# Patient Record
Sex: Female | Born: 1935 | Race: White | Hispanic: No | Marital: Married | Smoking: Never smoker
Health system: Southern US, Community
[De-identification: ages and names within clinical notes are randomized; demographics above are authoritative.]

## PROBLEM LIST (undated history)

## (undated) DIAGNOSIS — G459 Transient cerebral ischemic attack, unspecified: Secondary | ICD-10-CM

## (undated) DIAGNOSIS — E782 Mixed hyperlipidemia: Secondary | ICD-10-CM

## (undated) DIAGNOSIS — I5032 Chronic diastolic (congestive) heart failure: Secondary | ICD-10-CM

## (undated) DIAGNOSIS — I4891 Unspecified atrial fibrillation: Secondary | ICD-10-CM

## (undated) HISTORY — DX: Unspecified atrial fibrillation: I48.91

## (undated) HISTORY — DX: Mixed hyperlipidemia: E78.2

## (undated) HISTORY — DX: Transient cerebral ischemic attack, unspecified: G45.9

---

## 1997-03-01 HISTORY — PX: DILATION AND CURETTAGE OF UTERUS: SHX78

## 1998-10-25 ENCOUNTER — Ambulatory Visit (HOSPITAL_COMMUNITY): Admission: RE | Admit: 1998-10-25 | Discharge: 1998-10-25 | Payer: Self-pay | Admitting: Neurology

## 1998-10-25 ENCOUNTER — Encounter: Payer: Self-pay | Admitting: Neurology

## 2006-06-22 ENCOUNTER — Ambulatory Visit: Payer: Self-pay | Admitting: Cardiology

## 2006-07-27 ENCOUNTER — Ambulatory Visit: Payer: Self-pay | Admitting: Physician Assistant

## 2006-10-27 ENCOUNTER — Ambulatory Visit: Payer: Self-pay | Admitting: Cardiology

## 2010-07-14 NOTE — Assessment & Plan Note (Signed)
[facility]                          [facility]   LUZELENA, HEEG                            MRN:          [id number]  DATE:07/27/2006                            DOB:          1928/06/28    CARDIOLOGIST:  She is new to Dr. Andee Lineman.   PRIMARY CARE PHYSICIAN:  Dr. Sherril Croon in [place].   HISTORY OF PRESENT ILLNESS:  Ms. Guinther is a 77 year old female, a  retired Engineer, civil (consulting), who returns to the office today for post-hospitalization  followup.  She recently was noted to have paroxysmal atrial fibrillation  by CardioNet monitoring.  She also noted bilateral arm discomfort  associated with palpitations as well as shortness of breath.  She was  set up for an outpatient Myoview study after she ruled out for  myocardial infarction by enzymes; this was done Jul 01, 2006; it was an  adenosine study.  Her EF was 61% and her perfusion images were negative  for ischemia.  She returns today for followup.  She notes that she is  doing well.  She does note occasional palpitations, but they are much  better on the increased dose of Toprol.  She notes some chest pressure  with her palpitations that last about a minute.  She continues to note  the bilateral arm symptoms.  She also feels weak and tried with these.  She denies syncope or near-syncope.   CURRENT MEDICATIONS:  1. Toprol-XL 50 mg daily.  2. Coumadin as directed.   ALLERGIES:  CODEINE, SULFA and NOVOCAIN.   PHYSICAL EXAMINATION:  She is a well-developed, well-nourished female in  no acute distress.  Blood pressure is 139/83, pulse 72.  Weight 177.2 pounds.  HEENT:  Normal.  NECK:  Without JVD.  CARDIAC:  S1 and S2, regular rate and rhythm without murmurs.  LUNGS:  Clear to auscultation bilaterally with no significant rales.  ABDOMEN:  Soft and nontender with normoactive bowel sounds and no  organomegaly.  EXTREMITIES:  Without edema.  Calves are soft and nontender.  SKIN:  Warm and dry.  NEUROLOGIC:  She is alert and oriented x3.  Cranial nerves II-XII are  grossly intact.   IMPRESSION:  1. Paroxysmal atrial fibrillation.  2. Hypertension.  3. Nonischemic adenosine Myoview, Jul 01, 2006.  4. Good left ventricular function.  5. Aortic sclerosis by recent echocardiogram, mild.  6. Questionable history of transient ischemic attack.  7. Hypercholesterolemia, untreated.  8. Osteoporosis.  9. Osteoarthritis.  10.History of mild scoliosis.   PLAN:  The patient presents to the office today for a post-  hospitalization followup.  I reviewed her Cardiolite images with her.  She continues to be a little symptomatic with her paroxysmal atrial  fibrillation.  We checked an electrocardiogram in the office today.  Her  ventricular rate is 60.  She is still in sinus rhythm and there are no  ischemic changes.  Given that her heart rate is just 60, we will keep  her Toprol dose the same at this point in time.  She will continue to  follow up with Dr.  Vyas for her Coumadin and we will bring her back in 3  months for followup with Dr. Andee Lineman for further review.  She does still  have p.r.n. Lopressor to take should she have recurrent palpitations.  If her symptoms become bothersome for her, we will certainly see her  back sooner.  When she sees Dr. Andee Lineman back in followup, if she is  continuing to have symptoms and if they are quite bothersome for her, we  may want to consider repeating a monitor as well as considering  antiarrhythmic therapy.  Otherwise, if she remains fairly asymptomatic,  we will continue with Toprol and Coumadin therapy.  I will also discuss  with Dr. Andee Lineman before her next visit whether or not she could be a  candidate for the [employer]ocket Trial.  If he agrees, we will be in touch with  the patient over the telephone.      Tereso Newcomer, PA-C  Electronically Signed      Learta Codding, MD,FACC  Electronically Signed   SW/MedQ  DD: 07/27/2006  DT: 07/27/2006   Job #: [id number]   cc:   Doreen Beam

## 2010-07-14 NOTE — Assessment & Plan Note (Signed)
[facility]                          [facility]   Danielle Brooks, Danielle Brooks                            MRN:          [id number]  DATE:10/27/2006                            DOB:          August 04, 1928    REFERRING PHYSICIAN:  Dhruv Vyas   HISTORY OF PRESENT ILLNESS:  The patient is a 77 year old female,  retired Engineer, civil (consulting), with a history of paroxysmal atrial fibrillation  documented by CardioNet monitor.  The patient has been doing well.  She  has been asymptomatic.  Her [place] score is 2.  Although her blood  pressure is somewhat elevated here today, it has been running normal at  home.  She states on occasion she has skipped beat but this is rare.  She has otherwise no chest pain, shortness of breath, orthopnea, PND.  EKG in the office today shows a normal sinus rhythm with no acute  ischemic changes.   MEDICATIONS:  1. Toprol XL 50 mg p.o. daily.  2. Coumadin as directed.   PHYSICAL EXAMINATION:  VITAL SIGNS:  Blood pressure is 143/83, heart  rate is 65 beats per minute, weight is 182 pounds.  NECK:  Demonstrates normal carotid upstrokes.  No carotid bruits.  LUNGS:  Clear breath sounds bilaterally.  HEART:  Regular rate and rhythm.  Normal S1 S2.  No murmurs, rubs, or  gallops.  ABDOMEN:  Soft, nontender.  No rebound or guarding.  Good bowel sounds.  EXTREMITIES:  Reveals no cyanosis, clubbing, or edema.  NEUROLOGIC:  The patient is alert, oriented, and grossly nonfocal.   PROBLEM LIST:  1. Paroxysmal atrial fibrillation, now in normal sinus rhythm.  2. Hypertension.  3. Nonischemic adenosine Myoview on Jul 01, 2006.  4. Good left ventricular function.  5. Aortic sclerosis by recent echocardiogram.  6. Past history of transient ischemic attack.  7. Hypercholesterolemia.  8. Osteoporosis.   PLAN:  1. The patient's [place] score is 2, and therefore we will continue her      on Coumadin.  2. From a cardiovascular perspective, patient is  otherwise      asymptomatic.  I made no change      in the patient's medical regimen.  3. She can follow up with [place] in the next couple of months.     Danielle Codding, MD,FACC  Electronically Signed    GED/MedQ  DD: 10/28/2006  DT: 10/29/2006  Job #: [id number]   cc:   Doreen Beam

## 2011-06-21 ENCOUNTER — Encounter: Payer: Self-pay | Admitting: Internal Medicine

## 2011-07-28 ENCOUNTER — Encounter: Payer: Self-pay | Admitting: Cardiology

## 2011-07-28 ENCOUNTER — Encounter: Payer: Self-pay | Admitting: Internal Medicine

## 2011-07-29 ENCOUNTER — Ambulatory Visit (INDEPENDENT_AMBULATORY_CARE_PROVIDER_SITE_OTHER): Payer: Medicare Other | Admitting: Cardiology

## 2011-07-29 ENCOUNTER — Encounter: Payer: Self-pay | Admitting: Cardiology

## 2011-07-29 VITALS — BP 139/75 | HR 65 | Ht 62.0 in | Wt 172.0 lb

## 2011-07-29 DIAGNOSIS — R609 Edema, unspecified: Secondary | ICD-10-CM | POA: Insufficient documentation

## 2011-07-29 DIAGNOSIS — F410 Panic disorder [episodic paroxysmal anxiety] without agoraphobia: Secondary | ICD-10-CM

## 2011-07-29 DIAGNOSIS — M810 Age-related osteoporosis without current pathological fracture: Secondary | ICD-10-CM | POA: Insufficient documentation

## 2011-07-29 DIAGNOSIS — I4891 Unspecified atrial fibrillation: Secondary | ICD-10-CM

## 2011-07-29 DIAGNOSIS — I700 Atherosclerosis of aorta: Secondary | ICD-10-CM

## 2011-07-29 DIAGNOSIS — R002 Palpitations: Secondary | ICD-10-CM

## 2011-07-29 DIAGNOSIS — I480 Paroxysmal atrial fibrillation: Secondary | ICD-10-CM | POA: Insufficient documentation

## 2011-07-29 DIAGNOSIS — Z8673 Personal history of transient ischemic attack (TIA), and cerebral infarction without residual deficits: Secondary | ICD-10-CM

## 2011-07-29 DIAGNOSIS — R0602 Shortness of breath: Secondary | ICD-10-CM

## 2011-07-29 DIAGNOSIS — IMO0001 Reserved for inherently not codable concepts without codable children: Secondary | ICD-10-CM | POA: Insufficient documentation

## 2011-07-29 DIAGNOSIS — I10 Essential (primary) hypertension: Secondary | ICD-10-CM | POA: Insufficient documentation

## 2011-07-29 DIAGNOSIS — G4700 Insomnia, unspecified: Secondary | ICD-10-CM

## 2011-07-29 MED ORDER — PROPRANOLOL HCL ER 80 MG PO CP24: 80.0000 mg | ORAL_CAPSULE | Freq: Every day | ORAL | Status: DC

## 2011-07-29 MED ORDER — TRAZODONE HCL 100 MG PO TABS: 50.0000 mg | ORAL_TABLET | Freq: Every day | ORAL | Status: DC

## 2011-07-29 NOTE — Assessment & Plan Note (Signed)
This is dependent edema and I would not give the patient Lasix or a diuretic at this point in time.

## 2011-07-29 NOTE — Patient Instructions (Signed)
   Stop Metoprolol  Change to Propranolol LA 80mg  at bedtime  Trazodone 100mg  - may take 1/2 tab (50mg ) nightly for sleep, if needed may increase to 1 tab (100mg ) Follow up in  6-8 weeks

## 2011-07-29 NOTE — Assessment & Plan Note (Signed)
No recurrence patient on Coumadin anticoagulation. She will need to continue this.

## 2011-07-29 NOTE — Assessment & Plan Note (Signed)
The patient only sleeps 2-3 hours a night and this is significantly contributing to her daytime fatigue. I prescribed her trazodone 50 mg by mouth each bedtime which she can increase to 100 mg by mouth each bedtime needed

## 2011-07-29 NOTE — Progress Notes (Signed)
Danielle Bottoms, MD, [facility] Board Certified in Adult Cardiovascular Medicine,Internal Medicine and Critical Care Medicine    CC:  Evaluation of patient with palpitations and shortness of breath  HPI:  the patient is an 78 year old female , retired Engineer, civil (consulting) with a history of paroxysmal atrial fibrillation and prior TIAs. She is on chronic Coumadin therapy. She has been referred by primary care physician because of increased episodes of palpitations , shortness of breath on exertion often in the setting of anxiety and decreased exercise tolerance.  The patient is going through a stressful period in her life. Her son who has been cancer free for 12 years now has recurrence of non-Hodgkin's lymphoma and is not doing well. The patient feels very stressed about this and this has led to anxiety , possible depression as well as severe insomnia.  The patient only sleeps 2-3 hours a night. She also stopped drinking caffeine but now feels significantly fatigued during the daytime. However she has no other symptoms that point was possible obstructive sleep apnea.   the patient states that she has more frequent episodes of palpitations which are associated with shortness of breath. Typical episodes last 1 minute to several minutes. She feels anxious during this period of time and often takes Xanax which will make her shortness of breath better. The palpitations are never sustained. She does report significant decreased exercise tolerance with shortness of breath on exertion. An echocardiogram done in her primary care physician office however showed that she has normal LV function and no significant valvular lesions.   She denies any orthopnea PND presyncope or syncope.   PMH: reviewed and listed in Problem List in Electronic Records (and see below) Past Medical History  Diagnosis Date  . Osteoporosis   . Atrial fibrillation   . Vitamin d deficiency   . Chest pain, unspecified   . Atrial fibrillation   .  Other dyspnea and respiratory abnormality   . Palpitations   . Dizziness and giddiness   . Pure hypercholesterolemia   . Pain in limb   . Other malaise and fatigue   . Encounter for long-term (current) use of other medications    No past surgical history on file.  Allergies/SH/FHX : available in Electronic Records for review  Allergies  Allergen Reactions  . Bactrim (Sulfamethoxazole-Tmp Ds) Rash  . Codeine Itching  . Novocain (Procaine Hcl) Other (See Comments)    Sob and passed out years ago at dentist office   History   Social History  . Marital Status: Married    Spouse Name: JERRY    Number of Children: N/A  . Years of Education: N/A   Occupational History  . RETIRED     RN   Social History Main Topics  . Smoking status: Never Smoker   . Smokeless tobacco: Never Used  . Alcohol Use: No  . Drug Use: No  . Sexually Active: Not on file   Other Topics Concern  . Not on file   Social History Narrative  . No narrative on file   Family History  Problem Relation Age of Onset  . Coronary artery disease Father   . Alzheimer's disease Mother   . Hypertension Sister     Medications: Current Outpatient Prescriptions  Medication Sig Dispense Refill  . Vitamin D, Ergocalciferol, (DRISDOL) 50000 UNITS CAPS Take 50,000 Units by mouth every 7 (seven) days.      Marland Kitchen warfarin (COUMADIN) 5 MG tablet Take 5 mg by mouth as directed.  Managed by [facility] office      . propranolol ER (INDERAL LA) 80 MG 24 hr capsule Take 1 capsule (80 mg total) by mouth daily.  30 capsule  6  . traZODone (DESYREL) 100 MG tablet Take 0.5 tablets (50 mg total) by mouth at bedtime.  30 tablet  3    ROS: No nausea or vomiting. No fever or chills.No melena or hematochezia.No bleeding.No claudication  Physical Exam: BP 139/75  Pulse 65  Ht 5\' 2"  (1.575 m)  Wt 172 lb (78.019 kg)  BMI 31.46 kg/m2  SpO2 99% General: Well-nourished white female in no distress. Somewhat anxious appearing Neck: normal  carotid upstroke no carotid bruits. No thyromegaly nonnodular thyroid. JVP is 6-7 cm Lungs: clear breath sounds bilaterally without any wheezing or crackles Cardiac: regular rate and rhythm with normal S1-S2. No murmur rubs or gallops Vascular:  1+ peripheral pitting edema , normal distal pulses bilaterally Skin: warm and dry Physcologic: depressed affect  12lead ECG: normal sinus rhythm otherwise normal tracing Limited bedside ECHO:N/A No images are attached to the encounter.   Assessment and Plan  Shortness of breath  Patient reports shortness of breath on exertion. She had a negative ischemia workup in the past. She reports that her shortness of breath gets better when she takes Xanax. Is very fatigued  and has decreased stamina. She has some lower  extremity edema but this appears to be dependent edema. She had an echocardiogram done recently by her primary care physician which showed a normal ejection fraction and no significant valvular lesions. At this point I do not think the patient needs Lasix. I also do not think she needs an ischemia workup and I think her shortness of breath is related to do deconditioning and also in the setting of her anxiety and depression. Palpitations/proximal atrial fibrillation may be contributing  We may need to do a CardioNet monitor if her symptoms worsen  Hx-TIA (transient ischemic attack)  No recurrence patient on Coumadin anticoagulation. She will need to continue this.  Insomnia  The patient only sleeps 2-3 hours a night and this is significantly contributing to her daytime fatigue. I prescribed her trazodone 50 mg by mouth each bedtime which she can increase to 100 mg by mouth each bedtime needed  Palpitations  Patient appears to have transient episodes of atrial fibrillation typically lasting from 1 minute to several minutes. There is associated shortness of breath but also increased anxiety. I'm not sure if she actually has atrial fibrillation or  sinus tachycardia in the setting of anxiety/panic. I will switch the patient from metoprolol to propranolol LA 80 minutes by mouth each bedtime and if no improvement in her symptoms we will apply CardioNet monitor.  Edema  This is dependent edema and I would not give the patient Lasix or a diuretic at this point in time.    Patient Active Problem List  Diagnoses  . Edema  . Panic attacks  . Insomnia  . Paroxysmal atrial fibrillation  . Palpitations  . Aortic sclerosis  . Hypertension  . Osteoporosis  . Shortness of breath  . Hx-TIA (transient ischemic attack)

## 2011-07-29 NOTE — Assessment & Plan Note (Signed)
Patient reports shortness of breath on exertion. She had a negative ischemia workup in the past. She reports that her shortness of breath gets better when she takes Xanax. Is very fatigued  and has decreased stamina. She has some lower  extremity edema but this appears to be dependent edema. She had an echocardiogram done recently by her primary care physician which showed a normal ejection fraction and no significant valvular lesions. At this point I do not think the patient needs Lasix. I also do not think she needs an ischemia workup and I think her shortness of breath is related to do deconditioning and also in the setting of her anxiety and depression. Palpitations/proximal atrial fibrillation may be contributing  We may need to do a CardioNet monitor if her symptoms worsen

## 2011-07-29 NOTE — Assessment & Plan Note (Signed)
Patient appears to have transient episodes of atrial fibrillation typically lasting from 1 minute to several minutes. There is associated shortness of breath but also increased anxiety. I'm not sure if she actually has atrial fibrillation or sinus tachycardia in the setting of anxiety/panic. I will switch the patient from metoprolol to propranolol LA 80 minutes by mouth each bedtime and if no improvement in her symptoms we will apply CardioNet monitor.

## 2011-09-28 ENCOUNTER — Ambulatory Visit: Payer: Medicare Other | Admitting: Cardiology

## 2011-12-01 ENCOUNTER — Other Ambulatory Visit: Payer: Self-pay | Admitting: Cardiology

## 2011-12-23 ENCOUNTER — Ambulatory Visit: Payer: Medicare Other | Admitting: Cardiology

## 2011-12-30 ENCOUNTER — Other Ambulatory Visit: Payer: Self-pay | Admitting: Cardiology

## 2012-01-07 ENCOUNTER — Encounter: Payer: Self-pay | Admitting: Cardiology

## 2012-01-07 ENCOUNTER — Ambulatory Visit: Payer: Medicare Other | Admitting: Cardiology

## 2012-01-07 ENCOUNTER — Ambulatory Visit (INDEPENDENT_AMBULATORY_CARE_PROVIDER_SITE_OTHER): Payer: Medicare Other | Admitting: Cardiology

## 2012-01-07 VITALS — BP 158/82 | HR 58 | Ht 62.0 in | Wt 176.0 lb

## 2012-01-07 DIAGNOSIS — R002 Palpitations: Secondary | ICD-10-CM

## 2012-01-07 DIAGNOSIS — I480 Paroxysmal atrial fibrillation: Secondary | ICD-10-CM

## 2012-01-07 DIAGNOSIS — I10 Essential (primary) hypertension: Secondary | ICD-10-CM

## 2012-01-07 DIAGNOSIS — Z8673 Personal history of transient ischemic attack (TIA), and cerebral infarction without residual deficits: Secondary | ICD-10-CM

## 2012-01-07 DIAGNOSIS — I4891 Unspecified atrial fibrillation: Secondary | ICD-10-CM

## 2012-01-07 NOTE — Progress Notes (Signed)
   Clinical Summary Danielle Brooks is a 78 y.o.female presenting for office followup. She is a former patient of Dr. Andee Lineman, most recently seen in May of this year. I reviewed her records. She states that she has been doing better since the last visit, sleeping well. She reports only very brief palpitations, nothing prolonged, no hospitalizations.  ECG today shows sinus bradycardia with increased voltage.  She continues on Coumadin under the direction of Dr. Sherril Croon. Denies any significant bleeding problems.  She states that she has gained weight. We talked about regular walking regimen, also her diet.   Allergies  Allergen Reactions  . Bactrim (Sulfamethoxazole-Tmp Ds) Rash  . Codeine Itching  . Novocain (Procaine Hcl) Other (See Comments)    Sob and passed out years ago at dentist office    Current Outpatient Prescriptions  Medication Sig Dispense Refill  . propranolol ER (INDERAL LA) 80 MG 24 hr capsule Take 1 capsule (80 mg total) by mouth daily.  30 capsule  6  . traZODone (DESYREL) 100 MG tablet take 1/2 tablet by mouth at bedtime **MAY INCREASE TO 1 TABLET AT BEDTIME IF NEEDED**  15 tablet  0  . Vitamin D, Ergocalciferol, (DRISDOL) 50000 UNITS CAPS Take 50,000 Units by mouth every 7 (seven) days.      Marland Kitchen warfarin (COUMADIN) 5 MG tablet Take 5 mg by mouth as directed. Managed by [facility] office        Past Medical History  Diagnosis Date  . Osteoporosis   . Atrial fibrillation     Paroxysmal, history of normal LV and negative ischemic workup  . Vitamin D deficiency   . Mixed hyperlipidemia   . TIA (transient ischemic attack)     Social History Danielle Brooks reports that she has never smoked. She has never used smokeless tobacco. Danielle Brooks reports that she does not drink alcohol.  Review of Systems Outlined above, otherwise negative.  Physical Examination Filed Vitals:   01/07/12 1526  BP: 158/82  Pulse: 58   Filed Weights   01/07/12 1526  Weight: 176 lb 0.6 oz (79.851 kg)    No acute distress. HEENT: Conjunctiva and lids normal, oropharynx clear. Neck: Supple, no elevated JVP or carotid bruits, no thyromegaly. Lungs: Clear to auscultation, nonlabored breathing at rest. Cardiac: Regular rate and rhythm, no S3 or significant systolic murmur, no pericardial rub. Abdomen: Soft, nontender, bowel sounds present. Extremities: No pitting edema, distal pulses 2+.   Problem List and Plan   Paroxysmal atrial fibrillation Continue current strategy of medical therapy and observation. She continues on Coumadin per Dr. Sherril Croon. She has only occasional brief palpitations. No clear indication for modifying medical therapy or consider antiarrhythmics at this time.  Hypertension Blood pressure elevated today, typically not the case per her discussion. I asked her to check occasionally at home.  Hx-TIA (transient ischemic attack) Reports no obvious neurological symptoms since being on Coumadin.    Jonelle Sidle, M.D., F.A.C.C.

## 2012-01-07 NOTE — Assessment & Plan Note (Signed)
Reports no obvious neurological symptoms since being on Coumadin.

## 2012-01-07 NOTE — Assessment & Plan Note (Signed)
Continue current strategy of medical therapy and observation. She continues on Coumadin per Dr. Sherril Croon. She has only occasional brief palpitations. No clear indication for modifying medical therapy or consider antiarrhythmics at this time.

## 2012-01-07 NOTE — Patient Instructions (Signed)
Your physician recommends that you schedule a follow-up appointment in: 6 MONTHS IN THE [facility]ICE  Your physician recommends that you continue on your current medications as directed. Please refer to the Current Medication list given to you today.

## 2012-01-07 NOTE — Assessment & Plan Note (Signed)
Blood pressure elevated today, typically not the case per her discussion. I asked her to check occasionally at home.

## 2012-01-18 ENCOUNTER — Other Ambulatory Visit: Payer: Self-pay | Admitting: *Deleted

## 2012-01-18 MED ORDER — TRAZODONE HCL 100 MG PO TABS: ORAL_TABLET | ORAL | Status: DC

## 2012-02-28 ENCOUNTER — Other Ambulatory Visit: Payer: Self-pay | Admitting: Cardiology

## 2012-02-28 MED ORDER — PROPRANOLOL HCL ER 80 MG PO CP24: 80.0000 mg | ORAL_CAPSULE | Freq: Every day | ORAL | Status: DC

## 2012-08-30 ENCOUNTER — Other Ambulatory Visit: Payer: Self-pay | Admitting: Cardiology

## 2012-08-30 MED ORDER — PROPRANOLOL HCL ER 80 MG PO CP24: 80.0000 mg | ORAL_CAPSULE | Freq: Every day | ORAL | Status: DC

## 2012-10-19 ENCOUNTER — Ambulatory Visit (INDEPENDENT_AMBULATORY_CARE_PROVIDER_SITE_OTHER): Payer: Medicare Other | Admitting: Cardiology

## 2012-10-19 ENCOUNTER — Encounter: Payer: Self-pay | Admitting: Cardiology

## 2012-10-19 VITALS — BP 144/84 | HR 60 | Ht 62.0 in | Wt 190.0 lb

## 2012-10-19 DIAGNOSIS — I480 Paroxysmal atrial fibrillation: Secondary | ICD-10-CM

## 2012-10-19 DIAGNOSIS — I4891 Unspecified atrial fibrillation: Secondary | ICD-10-CM

## 2012-10-19 DIAGNOSIS — I10 Essential (primary) hypertension: Secondary | ICD-10-CM

## 2012-10-19 DIAGNOSIS — R0602 Shortness of breath: Secondary | ICD-10-CM

## 2012-10-19 NOTE — Progress Notes (Signed)
   Clinical Summary Danielle Brooks is an 79 y.o.female last seen in November 2013. She reports continued problems with anxiety, feels overwhelmed at times. Has not been exercising. Stays active with her church however and other community activities as best she can.  She does report brief episodes of palpitations, does not seem to be any progression in either frequency or duration.  She continues on Coumadin under the direction of Dr. Sherril Croon. Denies any significant bleeding problems.  Also feels short of breath at times. Echocardiogram from last April done at [facility] reported normal LVEF with mild mitral, aortic, and tricuspid regurgitation.  ECG today shows normal sinus rhythm.   Allergies  Allergen Reactions  . Bactrim [Sulfamethoxazole-Tmp Ds] Rash  . Codeine Itching  . Novocain [Procaine Hcl] Other (See Comments)    Sob and passed out years ago at dentist office    Current Outpatient Prescriptions  Medication Sig Dispense Refill  . propranolol ER (INDERAL LA) 80 MG 24 hr capsule Take 1 capsule (80 mg total) by mouth daily.  30 capsule  2  . traZODone (DESYREL) 100 MG tablet take 1/2 tablet by mouth at bedtime **MAY INCREASE TO 1 TABLET AT BEDTIME IF NEEDED**  30 tablet  2  . Vitamin D, Ergocalciferol, (DRISDOL) 50000 UNITS CAPS Take 50,000 Units by mouth every 7 (seven) days.      Marland Kitchen warfarin (COUMADIN) 5 MG tablet Take 5 mg by mouth as directed. Managed by [facility] office       No current facility-administered medications for this visit.    Past Medical History  Diagnosis Date  . Osteoporosis   . Atrial fibrillation     Paroxysmal, history of normal LV and negative ischemic workup  . Vitamin D deficiency   . Mixed hyperlipidemia   . TIA (transient ischemic attack)     Social History Danielle Brooks reports that she has never smoked. She has never used smokeless tobacco. Danielle Brooks reports that she does not drink alcohol.  Review of Systems No cough, fevers or chills. States that she is  eating too much due to anxiety. Sleeps well. Otherwise negative.  Physical Examination Filed Vitals:   10/19/12 1118  BP: 144/84  Pulse: 60   Filed Weights   10/19/12 1118  Weight: 190 lb (86.183 kg)     No acute distress.  HEENT: Conjunctiva and lids normal, oropharynx clear.  Neck: Supple, no elevated JVP or carotid bruits, no thyromegaly.  Lungs: Clear to auscultation, nonlabored breathing at rest.  Cardiac: Regular rate and rhythm, no S3 or significant systolic murmur, no pericardial rub.  Abdomen: Soft, nontender, bowel sounds present.  Extremities: No pitting edema, distal pulses 2+.   Problem List and Plan   Paroxysmal atrial fibrillation Continue current medical regimen. She is describing shortness of breath, and will follow up with an echocardiogram to ensure no progression of valvular disease. Exam is stable. Followup arranged.  Hypertension Keep follow up with Dr. Sherril Croon.    Jonelle Sidle, M.D., F.A.C.C.

## 2012-10-19 NOTE — Patient Instructions (Addendum)

## 2012-10-19 NOTE — Assessment & Plan Note (Signed)
Continue current medical regimen. She is describing shortness of breath, and will follow up with an echocardiogram to ensure no progression of valvular disease. Exam is stable. Followup arranged.

## 2012-10-19 NOTE — Assessment & Plan Note (Signed)
Keep followup with Dr. Vyas. 

## 2012-10-26 ENCOUNTER — Other Ambulatory Visit: Payer: Self-pay

## 2012-10-26 ENCOUNTER — Other Ambulatory Visit (INDEPENDENT_AMBULATORY_CARE_PROVIDER_SITE_OTHER): Payer: Medicare Other

## 2012-10-26 ENCOUNTER — Other Ambulatory Visit: Payer: Medicare Other

## 2012-10-26 DIAGNOSIS — I480 Paroxysmal atrial fibrillation: Secondary | ICD-10-CM

## 2012-10-26 DIAGNOSIS — I4891 Unspecified atrial fibrillation: Secondary | ICD-10-CM

## 2012-10-26 DIAGNOSIS — R0602 Shortness of breath: Secondary | ICD-10-CM

## 2012-10-31 ENCOUNTER — Telehealth: Payer: Self-pay | Admitting: *Deleted

## 2012-10-31 NOTE — Telephone Encounter (Signed)
Message copied by Eustace Moore on Tue Oct 31, 2012  3:34 PM ------      Message from: MCDOWELL, Illene Bolus      Created: Fri Oct 27, 2012  2:01 PM       LVEF remains normal at 55-60%. Does have diastolic dysfunction with increased filling pressures - she might consider the addition of a low-dose diuretic for treatment of hypertension, may help her shortness of breath as well. No major progression of valvular disease, mild aortic and mitral regurgitation as before. ------

## 2012-10-31 NOTE — Telephone Encounter (Signed)
Patient informed. Patient states she does have furosemide 40 mg and K+ 20 meq that was given to her by Dr. Sherril Croon to use as needed only.

## 2012-12-22 ENCOUNTER — Other Ambulatory Visit: Payer: Self-pay | Admitting: Cardiology

## 2012-12-22 MED ORDER — PROPRANOLOL HCL ER 80 MG PO CP24: 80.0000 mg | ORAL_CAPSULE | Freq: Every day | ORAL | Status: DC

## 2013-01-01 ENCOUNTER — Ambulatory Visit: Payer: Medicare Other | Admitting: Cardiology

## 2013-01-04 ENCOUNTER — Other Ambulatory Visit: Payer: Self-pay

## 2013-06-06 ENCOUNTER — Ambulatory Visit: Payer: Medicare Other | Admitting: Cardiology

## 2013-06-28 ENCOUNTER — Ambulatory Visit (INDEPENDENT_AMBULATORY_CARE_PROVIDER_SITE_OTHER): Payer: Medicare Other | Admitting: Cardiology

## 2013-06-28 ENCOUNTER — Encounter: Payer: Self-pay | Admitting: Cardiology

## 2013-06-28 VITALS — BP 168/77 | HR 56 | Ht 62.0 in | Wt 190.0 lb

## 2013-06-28 DIAGNOSIS — I4891 Unspecified atrial fibrillation: Secondary | ICD-10-CM

## 2013-06-28 DIAGNOSIS — I480 Paroxysmal atrial fibrillation: Secondary | ICD-10-CM

## 2013-06-28 DIAGNOSIS — I10 Essential (primary) hypertension: Secondary | ICD-10-CM

## 2013-06-28 NOTE — Assessment & Plan Note (Signed)
Blood pressure is up today, much more than typical per discussion with her. 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 We discussed weight loss, increasing her activity. She should record blood pressure and follow up with Dr. Sherril CroonVyas. If measurements remain elevated, might consider using HCTZ in light of concurrent diastolic dysfunction.

## 2013-06-28 NOTE — Assessment & Plan Note (Signed)
She is in sinus rhythm on examination today. Continue current medical regimen including Inderal and Coumadin. Followup arranged in 6 months.

## 2013-06-28 NOTE — Patient Instructions (Signed)
Continue all current medications. Your physician wants you to follow up in: 6 months.  You will receive a reminder letter in the mail one-two months in advance.  If you don't receive a letter, please call our office to schedule the follow up appointment   

## 2013-06-28 NOTE — Progress Notes (Signed)
Clinical Summary [name] is an 80 y.o.female last seen in August 2014. Overall she has been doing reasonably well from a cardiac perspective. 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 Sounds like she may have slipped into some depression at that time, ate lots of sweets, and gained about 25 pounds. Blood pressure is elevated today. We talked about her lifestyle, she states that she is feeling better and "knows how to lose the weight." She admits that she has not been as active.  Echocardiogram from August 2014 showed LVEF 55-60% with diastolic dysfunction associated with increased filling pressures, mild aortic regurgitation, mild mitral regurgitation, mild left atrial enlargement. She has Lasix which she rarely uses at all. Does have some leg edema, also knee arthritis.  She continues on Coumadin under the direction of Dr. Sherril CroonVyas. Denies any significant bleeding problems.   Allergies  Allergen Reactions  . Bactrim [Sulfamethoxazole-Tmp Ds] Rash  . Codeine Itching  . Novocain [Procaine Hcl] Other (See Comments)    Sob and passed out years ago at dentist office    Current Outpatient Prescriptions  Medication Sig Dispense Refill  . furosemide (LASIX) 40 MG tablet Take 40 mg by mouth daily as needed.      . potassium chloride SA (K-DUR,KLOR-CON) 20 MEQ tablet Take 20 mEq by mouth daily as needed.      . propranolol ER (INDERAL LA) 80 MG 24 hr capsule Take 1 capsule (80 mg total) by mouth daily.  30 capsule  6  . traZODone (DESYREL) 100 MG tablet take 1/2 tablet by mouth at bedtime **MAY INCREASE TO 1 TABLET AT BEDTIME IF NEEDED**  30 tablet  2  . Vitamin D, Ergocalciferol, (DRISDOL) 50000 UNITS CAPS Take 50,000 Units by mouth every 7 (seven) days.      Marland Kitchen. warfarin (COUMADIN) 5 MG tablet Take 5 mg by mouth as directed.  Managed by [facility] office       No current facility-administered medications for this visit.    Past Medical History  Diagnosis Date  . Osteoporosis   . Atrial fibrillation     Paroxysmal, history of normal LV and negative ischemic workup  . Vitamin D deficiency   . Mixed hyperlipidemia   . TIA (transient ischemic attack)     Social History [name] reports that she has never smoked. She has never used smokeless tobacco. [name] reports that she does not drink alcohol.  Review of Systems Negative except as outlined.  Physical Examination Filed Vitals:   06/28/13 1356  BP: 168/77  Pulse: 56   Filed Weights   06/28/13 1356  Weight: 190 lb (86.183 kg)    No acute distress.  HEENT: Conjunctiva and lids normal, oropharynx clear.  Neck: Supple, no elevated JVP or carotid bruits, no thyromegaly.  Lungs: Clear to auscultation, nonlabored breathing at rest.  Cardiac: Regular rate and rhythm, no S3 or significant systolic murmur, no pericardial rub.  Abdomen: Soft, nontender, bowel sounds present.  Extremities: No pitting edema, distal pulses 2+.   Problem List and Plan   Paroxysmal atrial fibrillation She is in sinus rhythm on examination today. Continue current medical regimen including Inderal and Coumadin. Followup arranged in 6 months.  Hypertension Blood pressure is up today, much more than typical per discussion with her. 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 We discussed weight loss, increasing her activity. She should record blood pressure and follow up with Dr. Sherril CroonVyas. If measurements remain elevated, might consider using HCTZ in light of concurrent diastolic dysfunction.    Jonelle SidleSamuel G. Consuello Lassalle, M.D., F.A.C.C.

## 2013-07-10 ENCOUNTER — Other Ambulatory Visit: Payer: Self-pay | Admitting: *Deleted

## 2013-07-10 MED ORDER — PROPRANOLOL HCL ER 80 MG PO CP24: 80.0000 mg | ORAL_CAPSULE | Freq: Every day | ORAL | Status: DC

## 2013-07-19 ENCOUNTER — Encounter: Payer: Self-pay | Admitting: Cardiology

## 2014-01-28 ENCOUNTER — Other Ambulatory Visit: Payer: Self-pay | Admitting: *Deleted

## 2014-01-28 MED ORDER — PROPRANOLOL HCL ER 80 MG PO CP24: 80.0000 mg | ORAL_CAPSULE | Freq: Every day | ORAL | Status: DC

## 2014-02-07 ENCOUNTER — Encounter: Payer: Self-pay | Admitting: Cardiology

## 2014-02-07 ENCOUNTER — Ambulatory Visit (INDEPENDENT_AMBULATORY_CARE_PROVIDER_SITE_OTHER): Payer: Medicare Other | Admitting: Cardiology

## 2014-02-07 VITALS — BP 157/74 | HR 59 | Ht 62.0 in | Wt 191.0 lb

## 2014-02-07 DIAGNOSIS — I10 Essential (primary) hypertension: Secondary | ICD-10-CM

## 2014-02-07 DIAGNOSIS — I480 Paroxysmal atrial fibrillation: Secondary | ICD-10-CM

## 2014-02-07 NOTE — Progress Notes (Signed)
   Reason for visit: Atrial fibrillation  Clinical Summary Danielle Brooks is an 80 y.o.female last seen in April. She presents for a routine visit. Reports occasional palpitations and spells which she attributes to anxiety mostly. No exertional chest pain or shortness of breath. She has been focused on her husband's health in the last few months.  Heart rate is regular today. ECG from May showed sinus rhythm with PAC. She continues on Coumadin for stroke prophylaxis with PAF, also Inderal LA with no changes.  Echocardiogram from August 2014 showed LVEF 55-60% with diastolic dysfunction associated with increased filling pressures, mild aortic regurgitation, mild mitral regurgitation, mild left atrial enlargement.  Allergies  Allergen Reactions  . Bactrim [Sulfamethoxazole-Trimethoprim] Rash  . Codeine Itching  . Novocain [Procaine Hcl] Other (See Comments)    Sob and passed out years ago at dentist office    Current Outpatient Prescriptions  Medication Sig Dispense Refill  . furosemide (LASIX) 40 MG tablet Take 40 mg by mouth daily as needed.    . potassium chloride SA (K-DUR,KLOR-CON) 20 MEQ tablet Take 20 mEq by mouth daily as needed.    . propranolol ER (INDERAL LA) 80 MG 24 hr capsule Take 1 capsule (80 mg total) by mouth daily. 30 capsule 6  . traZODone (DESYREL) 100 MG tablet take 1/2 tablet by mouth at bedtime **MAY INCREASE TO 1 TABLET AT BEDTIME IF NEEDED** 30 tablet 2  . Vitamin D, Ergocalciferol, (DRISDOL) 50000 UNITS CAPS Take 50,000 Units by mouth every 7 (seven) days.    Marland Kitchen. warfarin (COUMADIN) 5 MG tablet Take 5 mg by mouth as directed. Managed by [facility] office     No current facility-administered medications for this visit.    Past Medical History  Diagnosis Date  . Osteoporosis   . Atrial fibrillation     Paroxysmal, history of normal LV and negative ischemic workup  . Vitamin D deficiency   . Mixed hyperlipidemia   . TIA (transient ischemic attack)     Social  History Ms. Rufo reports that she has never smoked. She has never used smokeless tobacco. Danielle Brooks reports that she does not drink alcohol.  Review of Systems Complete review of systems negative except as otherwise outlined in the clinical summary.  Physical Examination Filed Vitals:   02/07/14 0915  BP: 157/74  Pulse: 59   Filed Weights   02/07/14 0915  Weight: 191 lb (86.637 kg)    No acute distress.  HEENT: Conjunctiva and lids normal, oropharynx clear.  Neck: Supple, no elevated JVP or carotid bruits, no thyromegaly.  Lungs: Clear to auscultation, nonlabored breathing at rest.  Cardiac: Regular rate and rhythm, no S3 or significant systolic murmur, no pericardial rub.  Abdomen: Soft, nontender, bowel sounds present.  Extremities: No pitting edema, distal pulses 2+.   Problem List and Plan   Paroxysmal atrial fibrillation Continue Coumadin and Inderal LA. Anticoagulation is followed by Dr. Sherril CroonVyas. Annual follow-up.  Essential hypertension Blood pressure is elevated today. Patient states that stress contributes to this. I have asked her to keep follow-up with Dr. Sherril CroonVyas in case additional medical therapy is needed. She is already limiting sodium in her diet.    Jonelle SidleSamuel G. Marvel Sapp, M.D., F.A.C.C.

## 2014-02-07 NOTE — Patient Instructions (Signed)

## 2014-02-07 NOTE — Assessment & Plan Note (Signed)
Continue Coumadin and Inderal LA. Anticoagulation is followed by Dr. Sherril CroonVyas. Annual follow-up.

## 2014-02-07 NOTE — Assessment & Plan Note (Signed)
Blood pressure is elevated today. Patient states that stress contributes to this. I have asked her to keep follow-up with Dr. Sherril CroonVyas in case additional medical therapy is needed. She is already limiting sodium in her diet.

## 2014-10-15 ENCOUNTER — Ambulatory Visit (INDEPENDENT_AMBULATORY_CARE_PROVIDER_SITE_OTHER): Payer: Medicare Other | Admitting: Cardiology

## 2014-10-15 ENCOUNTER — Encounter: Payer: Self-pay | Admitting: Cardiology

## 2014-10-15 ENCOUNTER — Encounter: Payer: Self-pay | Admitting: *Deleted

## 2014-10-15 VITALS — BP 100/82 | HR 102 | Ht 65.0 in | Wt 188.0 lb

## 2014-10-15 DIAGNOSIS — I5032 Chronic diastolic (congestive) heart failure: Secondary | ICD-10-CM

## 2014-10-15 DIAGNOSIS — Z5181 Encounter for therapeutic drug level monitoring: Secondary | ICD-10-CM | POA: Diagnosis not present

## 2014-10-15 DIAGNOSIS — I480 Paroxysmal atrial fibrillation: Secondary | ICD-10-CM | POA: Diagnosis not present

## 2014-10-15 DIAGNOSIS — I10 Essential (primary) hypertension: Secondary | ICD-10-CM | POA: Diagnosis not present

## 2014-10-15 DIAGNOSIS — R0602 Shortness of breath: Secondary | ICD-10-CM | POA: Diagnosis not present

## 2014-10-15 NOTE — Patient Instructions (Addendum)
Your physician recommends that you continue on your current medications as directed. Please refer to the Current Medication list given to you today. Your physician recommends that you return for lab work to check your liver and thyroid funcitoning. Your physician has requested that you have a lexiscan myoview. For further information please visit [email or web address] Please follow instruction sheet, as given. Your physician recommends that you schedule a follow-up appointment in: 2 weeks.

## 2014-10-15 NOTE — Progress Notes (Signed)
Cardiology Office Note  Date: 10/15/2014   ID: Danielle Brooks, DOB 1928/03/10, MRN [id number]  PCP: Ignatius Specking., MD  Primary Cardiologist: Nona Dell, MD   Chief Complaint  Patient presents with  . PAF  . Shortness of Breath    History of Present Illness: Danielle Brooks is an 81 y.o. female last seen in December 2015. Limited records indicate hospitalization at [facility] in July with reported heart failure symptoms and asthmatic bronchitis. She is here today with her husband for a follow-up visit. She tells me that she has been experiencing more atrial fibrillation, also increasing dyspnea on exertion and mild precordial discomfort. She states that she felt as if she was in atrial fibrillation today, and this is confirmed by ECG. Otherwise medications have not changed from a cardiac perspective.  She had a recent echocardiogram done through [facility] in late July reporting LVEF 50-55%, mild mitral regurgitation, mild aortic regurgitation, mild tricuspid regurgitation, and no pericardial effusion.  She continues on Coumadin, followed by Dr. Sherril Croon. No reported bleeding problems.  She has not been on antiarrhythmic therapy before. We did discussed this today, possibilities of flecainide or amiodarone. She has not had an ischemic workup recently.   Past Medical History  Diagnosis Date  . Osteoporosis   . Atrial fibrillation     Paroxysmal, history of normal LV and negative ischemic workup  . Vitamin D deficiency   . Mixed hyperlipidemia   . TIA (transient ischemic attack)     History reviewed. No pertinent past surgical history.  Current Outpatient Prescriptions  Medication Sig Dispense Refill  . furosemide (LASIX) 40 MG tablet Take 20 mg by mouth every other day.     . metoprolol (LOPRESSOR) 50 MG tablet Take 50 mg by mouth 2 (two) times daily.    . potassium chloride SA (K-DUR,KLOR-CON) 20 MEQ tablet Take 20 mEq by mouth every other day.     . traZODone  (DESYREL) 100 MG tablet take 1/2 tablet by mouth at bedtime **MAY INCREASE TO 1 TABLET AT BEDTIME IF NEEDED** 30 tablet 2  . Vitamin D, Ergocalciferol, (DRISDOL) 50000 UNITS CAPS Take 50,000 Units by mouth every 7 (seven) days.    Marland Kitchen warfarin (COUMADIN) 5 MG tablet Take 5 mg by mouth as directed. Managed by [facility] office     No current facility-administered medications for this visit.    Allergies:  Bactrim; Codeine; and Novocain   Social History: The patient  reports that she has never smoked. She has never used smokeless tobacco. She reports that she does not drink alcohol or use illicit drugs.   ROS:  Please see the history of present illness. Otherwise, complete review of systems is positive for none.  All other systems are reviewed and negative.   Physical Exam: VS:  BP 100/82 mmHg  Pulse 102  Ht  (1.651 m)  Wt 188 lb (85.276 kg)  BMI 31.28 kg/m2  SpO2 97%, BMI Body mass index is 31.28 kg/(m^2).  Wt Readings from Last 3 Encounters:  10/15/14 188 lb (85.276 kg)  02/07/14 191 lb (86.637 kg)  06/28/13 190 lb (86.183 kg)    No acute distress.  HEENT: Conjunctiva and lids normal, oropharynx clear.  Neck: Supple, no elevated JVP or carotid bruits, no thyromegaly.  Lungs: Clear to auscultation, nonlabored breathing at rest.  Cardiac: Irregularly irregular, no S3 or significant systolic murmur, no pericardial rub.  Abdomen: Soft, nontender, bowel sounds present.  Extremities: No pitting edema, distal pulses  2+. Skin: Warm and dry. Musculoskeletal: No kyphosis. Neuropsychiatric: Alert and oriented 3, affect appropriate.   ECG: ECG is ordered today and shows atrial fibrillation at 104 bpm with nonspecific T-wave changes and low voltage.   Recent Labwork:  July 2016: BUN 23, creatinine 0.8, potassium 4.0, INR 2.2, hemoglobin 12.3, platelets 277.  Other Studies Reviewed Today:  Echocardiogram from August 2014 showed LVEF 55-60% with diastolic dysfunction associated  with increased filling pressures, mild aortic regurgitation, mild mitral regurgitation, mild left atrial enlargement.  Chest x-ray 09/08/2014 [facility]) reported bronchitic changes without infiltrate or edema.  Assessment and Plan:  1. Paroxysmal to persistent atrial fibrillation with increasing burden, also symptomatic as outlined above. Recent echocardiogram done at [facility] reviewed. At this point plan will be to obtain a Lexiscan Cardiolite to assess ischemic burden. If this is low risk, we will likely consider starting flecainide (she states that her sister takes this and it has been effective for her). Also obtain TSH and LFTs in case we elect to use amiodarone. Office follow-up arranged to discuss the options further.  2. Probable diastolic heart failure symptoms in association with increased atrial fibrillation burden. LVEF 50-55% by recent echocardiogram.  3. Mild mitral, aortic, and tricuspid regurgitation, unlikely to be symptom provoking.  Current medicines were reviewed with the patient today.   Orders Placed This Encounter  Procedures  . NM Myocar Multi W/Spect W/Wall Motion / EF  . Hepatic function panel  . TSH  . Myocardial Perfusion Imaging  . EKG 12-Lead    Disposition: FU with me in 2 weeks.   Signed, Jonelle Sidle, MD, [facility] 10/15/2014 9:59 AM    [facility] at [facility] [address] [place], [place], [place] [address] Phone: [phone]; Fax: [phone]

## 2014-10-23 ENCOUNTER — Encounter (HOSPITAL_COMMUNITY)
Admission: RE | Admit: 2014-10-23 | Discharge: 2014-10-23 | Disposition: A | Discharge status: Home / Self Care | Payer: Medicare Other | Source: Ambulatory Visit | Attending: Cardiology | Admitting: Cardiology

## 2014-10-23 ENCOUNTER — Other Ambulatory Visit (HOSPITAL_COMMUNITY)
Admission: RE | Admit: 2014-10-23 | Discharge: 2014-10-23 | Disposition: A | Discharge status: Home / Self Care | Payer: Medicare Other | Source: Ambulatory Visit | Attending: Cardiology | Admitting: Cardiology

## 2014-10-23 ENCOUNTER — Encounter (HOSPITAL_COMMUNITY): Payer: Self-pay

## 2014-10-23 ENCOUNTER — Inpatient Hospital Stay (HOSPITAL_COMMUNITY): Admission: RE | Admit: 2014-10-23 | Payer: Medicare Other | Source: Ambulatory Visit

## 2014-10-23 DIAGNOSIS — I480 Paroxysmal atrial fibrillation: Secondary | ICD-10-CM | POA: Insufficient documentation

## 2014-10-23 DIAGNOSIS — R0602 Shortness of breath: Secondary | ICD-10-CM | POA: Insufficient documentation

## 2014-10-23 DIAGNOSIS — I481 Persistent atrial fibrillation: Secondary | ICD-10-CM | POA: Diagnosis not present

## 2014-10-23 LAB — HEPATIC FUNCTION PANEL
ALBUMIN: 3.3 g/dL — AB (ref 3.5–5.0)
ALK PHOS: 56 U/L (ref 38–126)
ALT: 13 U/L — ABNORMAL LOW (ref 14–54)
AST: 20 U/L (ref 15–41)
Bilirubin, Direct: 0.1 mg/dL (ref 0.1–0.5)
Indirect Bilirubin: 0.4 mg/dL (ref 0.3–0.9)
TOTAL PROTEIN: 6.3 g/dL — AB (ref 6.5–8.1)
Total Bilirubin: 0.5 mg/dL (ref 0.3–1.2)

## 2014-10-23 LAB — NM MYOCAR MULTI W/SPECT W/WALL MOTION / EF
CHL CUP NUCLEAR SRS: 1
CHL CUP NUCLEAR SSS: 9
LHR: 0.27
LV sys vol: 21 mL
LVDIAVOL: 58 mL
NUC STRESS TID: 1.03
Peak HR: 139 {beats}/min
Rest HR: 97 {beats}/min
SDS: 8

## 2014-10-23 LAB — TSH: TSH: 1.164 u[IU]/mL (ref 0.350–4.500)

## 2014-10-23 MED ORDER — SODIUM CHLORIDE 0.9 % IJ SOLN
INTRAMUSCULAR | Status: AC
  Administered 2014-10-23: 10 mL via INTRAVENOUS
  Filled 2014-10-23: qty 3

## 2014-10-23 MED ORDER — TECHNETIUM TC 99M SESTAMIBI - CARDIOLITE
30.0000 | Freq: Once | INTRAVENOUS | Status: AC | PRN
  Administered 2014-10-23: 11:00:00 32.6 via INTRAVENOUS

## 2014-10-23 MED ORDER — REGADENOSON 0.4 MG/5ML IV SOLN
INTRAVENOUS | Status: AC
  Administered 2014-10-23: 0.4 mg via INTRAVENOUS
  Filled 2014-10-23: qty 5

## 2014-10-23 MED ORDER — TECHNETIUM TC 99M SESTAMIBI GENERIC - CARDIOLITE
10.0000 | Freq: Once | INTRAVENOUS | Status: AC | PRN
  Administered 2014-10-23: 11 via INTRAVENOUS

## 2014-10-24 ENCOUNTER — Telehealth: Payer: Self-pay | Admitting: *Deleted

## 2014-10-24 NOTE — Telephone Encounter (Signed)
-----   Message from Jonelle Sidle, MD sent at 10/23/2014  1:23 PM EDT ----- Reviewed. TSH normal. AST and ALT also reviewed and essentially normal.

## 2014-10-24 NOTE — Telephone Encounter (Signed)
-----   Message from Jonelle Sidle, MD sent at 10/23/2014  1:26 PM EDT ----- Reviewed. Overall low risk study. I will plan to review with her further at office follow-up and we can decide about antiarrhythmic therapy for PAF.

## 2014-10-30 ENCOUNTER — Ambulatory Visit (INDEPENDENT_AMBULATORY_CARE_PROVIDER_SITE_OTHER): Payer: Medicare Other | Admitting: Cardiology

## 2014-10-30 ENCOUNTER — Encounter: Payer: Self-pay | Admitting: Cardiology

## 2014-10-30 VITALS — BP 130/84 | HR 79 | Ht 62.0 in | Wt 190.0 lb

## 2014-10-30 DIAGNOSIS — I480 Paroxysmal atrial fibrillation: Secondary | ICD-10-CM | POA: Diagnosis not present

## 2014-10-30 MED ORDER — FLECAINIDE ACETATE 50 MG PO TABS: 50.0000 mg | ORAL_TABLET | Freq: Two times a day (BID) | ORAL | Status: DC

## 2014-10-30 NOTE — Patient Instructions (Signed)
Your physician has recommended you make the following change in your medication:  Start flecainide 50 mg twice daily. Continue all other medications the same. Your physician recommends that you schedule a follow-up appointment in: 1 month.

## 2014-10-30 NOTE — Telephone Encounter (Signed)
Patient informed during office visit today. 

## 2014-10-30 NOTE — Progress Notes (Signed)
Cardiology Office Note  Date: 10/30/2014   ID: Danielle Brooks, DOB [birth year], MRN [id number]  PCP: Ignatius Specking., MD  Primary Cardiologist: Nona Dell, MD   Chief Complaint  Patient presents with  . Atrial Fibrillation  . Follow-up testing    History of Present Illness: Danielle Brooks is an 81 y.o. female seen recently on August 16. She is here today with her husband for follow-up. Continues to endorse more frequent bouts of atrial fibrillation and shortness of breath. We discussed results of her testing outlined below and possibilities for initiating antiarrhythmic therapy. We had reviewed this last time, her sister has done very well with flecainide, so we decided to give this a try as well.  Otherwise medications are unchanged, she continues on Coumadin and Lopressor.   Past Medical History  Diagnosis Date  . Osteoporosis   . Atrial fibrillation     Paroxysmal, history of normal LV and negative ischemic workup  . Vitamin D deficiency   . Mixed hyperlipidemia   . TIA (transient ischemic attack)     Current Outpatient Prescriptions  Medication Sig Dispense Refill  . furosemide (LASIX) 40 MG tablet Take 20 mg by mouth every other day.     . metoprolol (LOPRESSOR) 50 MG tablet Take 50 mg by mouth 2 (two) times daily.    . potassium chloride SA (K-DUR,KLOR-CON) 20 MEQ tablet Take 20 mEq by mouth every other day.     . traZODone (DESYREL) 100 MG tablet take 1/2 tablet by mouth at bedtime **MAY INCREASE TO 1 TABLET AT BEDTIME IF NEEDED** 30 tablet 2  . Vitamin D, Ergocalciferol, (DRISDOL) 50000 UNITS CAPS Take 50,000 Units by mouth every 7 (seven) days.    Marland Kitchen warfarin (COUMADIN) 5 MG tablet Take 5 mg by mouth as directed. Managed by [facility] office    . flecainide (TAMBOCOR) 50 MG tablet Take 1 tablet (50 mg total) by mouth 2 (two) times daily. 180 tablet 1   No current facility-administered medications for this visit.    Allergies:  Bactrim; Codeine; and Novocain   Social  History: The patient  reports that she has never smoked. She has never used smokeless tobacco. She reports that she does not drink alcohol or use illicit drugs.   ROS:  Please see the history of present illness. Otherwise, complete review of systems is positive for none.  All other systems are reviewed and negative.   Physical Exam: VS:  BP 130/84 mmHg  Pulse 79  Ht 5\' 2"  (1.575 m)  Wt 190 lb (86.183 kg)  BMI 34.74 kg/m2  SpO2 98%, BMI Body mass index is 34.74 kg/(m^2).  Wt Readings from Last 3 Encounters:  10/30/14 190 lb (86.183 kg)  10/15/14 188 lb (85.276 kg)  02/07/14 191 lb (86.637 kg)     No acute distress.  HEENT: Conjunctiva and lids normal, oropharynx clear.  Neck: Supple, no elevated JVP or carotid bruits, no thyromegaly.  Lungs: Clear to auscultation, nonlabored breathing at rest.  Cardiac: RRR, no S3 or significant systolic murmur, no pericardial rub.  Abdomen: Soft, nontender, bowel sounds present.  Extremities: No pitting edema, distal pulses 2+. Skin: Warm and dry. Musculoskeletal: No kyphosis. Neuropsychiatric: Alert and oriented 3, affect appropriate.   ECG: ECG is not ordered today.   Recent Labwork: 10/23/2014: ALT 13*; AST 20; TSH 1.164   Other Studies Reviewed Today:  Recent echocardiogram done through [facility] in late July reporting LVEF 50-55%, mild mitral regurgitation, mild aortic regurgitation,  mild tricuspid regurgitation, and no pericardial effusion.  Lexiscan Cardiolite 10/23/2014 showed atrial fibrillation throughout with no diagnostic ST segment changes, probable variable soft tissue attenuation affecting the mid anterolateral and apical anteroseptal walls, overall low risk, LVEF 64%.  Assessment and Plan:  Paroxysmal atrial fibrillation, symptomatic with increasing arrhythmia burden. Recent ischemic workup was low risk and her LVEF is normal. Plan is to initiate flecainide at 50 mg twice daily otherwise continue Lopressor  and Coumadin.  Current medicines were reviewed with the patient today.  Disposition: FU with me in 1 week.   Signed, Jonelle Sidle, MD, [facility] 10/30/2014 9:03 AM    [facility] at [facility] [address] [place], [place], [place] [address] Phone: [phone]; Fax: [phone]

## 2014-11-29 ENCOUNTER — Encounter: Payer: Self-pay | Admitting: Cardiology

## 2014-11-29 ENCOUNTER — Ambulatory Visit (INDEPENDENT_AMBULATORY_CARE_PROVIDER_SITE_OTHER): Payer: Medicare Other | Admitting: Cardiology

## 2014-11-29 VITALS — BP 116/74 | HR 78 | Ht 62.0 in | Wt 185.2 lb

## 2014-11-29 DIAGNOSIS — I480 Paroxysmal atrial fibrillation: Secondary | ICD-10-CM | POA: Diagnosis not present

## 2014-11-29 NOTE — Progress Notes (Signed)
Cardiology Office Note  Date: 11/29/2014   ID: Danielle Brooks, DOB 10/29/1928, MRN [id number]  PCP: Ignatius Specking., MD  Primary Cardiologist: Nona Dell, MD   Chief Complaint  Patient presents with  . PAF    History of Present Illness: Danielle Brooks is an 81 y.o. female seen recently in August. At that time we initiated flecainide for management of symptomatic PAF, she was otherwise continued on Lopressor and Coumadin. She presents today for a follow-up visit, states that she has had significant improvement in palpitations, still does have some breakthrough AF likely. She has otherwise tolerated her medications. No chest pain or syncope.   Past Medical History  Diagnosis Date  . Osteoporosis   . Atrial fibrillation     Paroxysmal, history of normal LV and negative ischemic workup  . Vitamin D deficiency   . Mixed hyperlipidemia   . TIA (transient ischemic attack)     Current Outpatient Prescriptions  Medication Sig Dispense Refill  . flecainide (TAMBOCOR) 50 MG tablet Take 1 tablet (50 mg total) by mouth 2 (two) times daily. 180 tablet 1  . furosemide (LASIX) 40 MG tablet Take 20 mg by mouth every other day.     . metoprolol (LOPRESSOR) 50 MG tablet Take 50 mg by mouth 2 (two) times daily.    . potassium chloride SA (K-DUR,KLOR-CON) 20 MEQ tablet Take 20 mEq by mouth every other day.     . traZODone (DESYREL) 100 MG tablet take 1/2 tablet by mouth at bedtime **MAY INCREASE TO 1 TABLET AT BEDTIME IF NEEDED** 30 tablet 2  . Vitamin D, Ergocalciferol, (DRISDOL) 50000 UNITS CAPS Take 50,000 Units by mouth every 7 (seven) days.    Marland Kitchen warfarin (COUMADIN) 5 MG tablet Take 5 mg by mouth as directed. Managed by [facility] office     No current facility-administered medications for this visit.    Allergies:  Bactrim; Codeine; and Novocain   Social History: The patient  reports that she has never smoked. She has never used smokeless tobacco. She reports that she does not drink alcohol or  use illicit drugs.   ROS:  Please see the history of present illness. Otherwise, complete review of systems is positive for intermittent psychosocial stressors and anxiety.  All other systems are reviewed and negative.   Physical Exam: VS:  BP 116/74 mmHg  Pulse 78  Ht  (1.575 m)  Wt 185 lb 3.2 oz (84.006 kg)  BMI 33.86 kg/m2  SpO2 96%, BMI Body mass index is 33.86 kg/(m^2).  Wt Readings from Last 3 Encounters:  11/29/14 185 lb 3.2 oz (84.006 kg)  10/30/14 190 lb (86.183 kg)  10/15/14 188 lb (85.276 kg)     No acute distress.  HEENT: Conjunctiva and lids normal, oropharynx clear.  Neck: Supple, no elevated JVP or carotid bruits, no thyromegaly.  Lungs: Clear to auscultation, nonlabored breathing at rest.  Cardiac: RRR, no S3 or significant systolic murmur, no pericardial rub.  Abdomen: Soft, nontender, bowel sounds present.  Extremities: No pitting edema, distal pulses 2+.  ECG: ECG is not ordered today.  Other Studies Reviewed Today:  Recent echocardiogram done through [facility] in late July reporting LVEF 50-55%, mild mitral regurgitation, mild aortic regurgitation, mild tricuspid regurgitation, and no pericardial effusion.  Lexiscan Cardiolite 10/23/2014 showed atrial fibrillation throughout with no diagnostic ST segment changes, probable variable soft tissue attenuation affecting the mid anterolateral and apical anteroseptal walls, overall low risk, LVEF 64%.  Assessment and Plan:  Paroxysmal atrial fibrillation, symptomatically better controlled on flecainide, Lopressor, and Coumadin. No changes were made today.  Current medicines were reviewed with the patient today.  Disposition: FU with me in 3 months.   Signed, Jonelle Sidle, MD, [facility] 11/29/2014 11:19 AM    [facility] at [facility] [address] [place], [place], [place] [address] Phone: [phone]; Fax: [phone]

## 2014-11-29 NOTE — Patient Instructions (Signed)
Your physician recommends that you continue on your current medications as directed. Please refer to the Current Medication list given to you today.  Your physician recommends that you schedule a follow-up appointment in: 3 months  

## 2015-02-27 ENCOUNTER — Encounter: Payer: Self-pay | Admitting: Cardiology

## 2015-02-27 ENCOUNTER — Ambulatory Visit (INDEPENDENT_AMBULATORY_CARE_PROVIDER_SITE_OTHER): Payer: Medicare Other | Admitting: Cardiology

## 2015-02-27 VITALS — BP 110/88 | HR 82 | Ht 62.0 in | Wt 192.0 lb

## 2015-02-27 DIAGNOSIS — R0600 Dyspnea, unspecified: Secondary | ICD-10-CM | POA: Diagnosis not present

## 2015-02-27 DIAGNOSIS — I481 Persistent atrial fibrillation: Secondary | ICD-10-CM | POA: Diagnosis not present

## 2015-02-27 DIAGNOSIS — R0602 Shortness of breath: Secondary | ICD-10-CM

## 2015-02-27 DIAGNOSIS — I4819 Other persistent atrial fibrillation: Secondary | ICD-10-CM

## 2015-02-27 DIAGNOSIS — I10 Essential (primary) hypertension: Secondary | ICD-10-CM

## 2015-02-27 MED ORDER — METOPROLOL SUCCINATE ER 50 MG PO TB24: 50.0000 mg | ORAL_TABLET | Freq: Two times a day (BID) | ORAL | Status: DC

## 2015-02-27 MED ORDER — FUROSEMIDE 20 MG PO TABS: 20.0000 mg | ORAL_TABLET | Freq: Every day | ORAL | Status: DC

## 2015-02-27 NOTE — Progress Notes (Signed)
Cardiology Office Note  Date: 02/27/2015   ID: Danielle Brooks, DOB 08/26/1928, MRN [id number]  PCP: Ignatius SpeckingVYAS,DHRUV B., MD  Primary Cardiologist: Nona DellSamuel McDowell, MD   Chief Complaint  Patient presents with  . PAF    History of Present Illness: Danielle Brooks is an 81 y.o. female last seen in September. She comes in today for a routine follow-up visit. She states that she has not felt as well in the last few months. Specifically, she has been more short of breath, also describes occasional orthopnea, and indicates leg edema that has progressed. She has had some symptoms of depression as well as overeating, but otherwise reports compliance with her medications.  Heart rate today on my check was around 105 bpm rest in atrial fibrillation. She feels palpitations occasionally. Cardiac workup in July and August of this year outlined below. 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  We reviewed her medications. Plan will be increase her Toprol XL to 50 mg twice daily and advance her Lasix to 40 mg daily through the weekend and then back to 20 mg every day instead of every other day.  Past Medical History  Diagnosis Date  . Osteoporosis   . Atrial fibrillation (HCC)     Paroxysmal, history of normal LV and negative ischemic workup  . Vitamin D deficiency   . Mixed hyperlipidemia   . TIA (transient ischemic attack)     Current Outpatient Prescriptions  Medication Sig Dispense Refill  . flecainide (TAMBOCOR) 50 MG tablet Take 1 tablet (50 mg total) by mouth 2 (two) times daily. 180 tablet 1  . furosemide (LASIX) 40 MG tablet Take 20 mg by mouth every other day.     . metoprolol succinate (TOPROL-XL) 50 MG 24 hr tablet Take 1 tablet (50 mg total) by mouth 2 (two) times daily. Dose increased 02/27/2015. 60 tablet 6   . potassium chloride SA (K-DUR,KLOR-CON) 20 MEQ tablet Take 20 mEq by mouth every other day.     . traZODone (DESYREL) 100 MG tablet take 1/2 tablet by mouth at bedtime **MAY INCREASE TO 1 TABLET AT BEDTIME IF NEEDED** 30 tablet 2  . Vitamin D, Ergocalciferol, (DRISDOL) 50000 UNITS CAPS Take 50,000 Units by mouth every 7 (seven) days.    Marland Kitchen. warfarin (COUMADIN) 5 MG tablet Take 5 mg by mouth as directed. Managed by [facility] office     No current facility-administered medications for this visit.   Allergies:  Bactrim; Codeine; and Novocain   Social History: The patient  reports that she has never smoked. She has never used smokeless tobacco. She reports that she does not drink alcohol or use illicit drugs.   ROS:  Please see the history of present illness. Otherwise, complete review of systems is positive for chronic knee pain.  All other systems are reviewed and negative.   Physical Exam: VS:  BP 110/88 mmHg  Pulse 82  Ht 5\' 2"  (1.575 m)  Wt 192 lb (87.091 kg)  BMI 35.11 kg/m2  SpO2 97%, BMI Body mass index is 35.11 kg/(m^2).  Wt Readings from Last 3 Encounters:  02/27/15 192 lb (87.091 kg)  11/29/14 185 lb 3.2 oz (84.006 kg)  10/30/14 190 lb (86.183 kg)    Overweight woman, no distress. HEENT: Conjunctiva and lids normal, oropharynx clear.  Neck: Supple,  no elevated JVP or carotid bruits, no thyromegaly.  Lungs: Clear to auscultation, nonlabored breathing at rest.  Cardiac: Irregularly irregular, no S3 or significant systolic murmur, no pericardial rub.  Abdomen: Soft, nontender, bowel sounds present.  Extremities: 2+ leg edema, distal pulses 2+.  ECG: Tracing from 10/15/2014 showed rate-controlled atrial fibrillation with QRS 84 ms.  Recent Labwork: 10/23/2014: ALT 13*; AST 20; TSH 1.164   Other Studies Reviewed Today:  Echocardiogram done through [facility] in July 2016 reported LVEF 50-55%, mild mitral regurgitation, mild aortic regurgitation, mild tricuspid  regurgitation, and no pericardial effusion.  Lexiscan Cardiolite 10/23/2014 showed atrial fibrillation throughout with no diagnostic ST segment changes, probable variable soft tissue attenuation affecting the mid anterolateral and apical anteroseptal walls, overall low risk, LVEF 64%.  Assessment and Plan:  1. Persistent atrial fibrillation with a history of PAF. She had been doing better on flecainide in terms of symptom control, but I am concerned that with evidence of volume overload she may have had interval development of cardiomyopathy. Plan will be to obtain an echocardiogram, advance Lasix to 40 mg through the weekend and then 20 mg daily thereafter, and increase Toprol XL to 50 mg twice daily. She will otherwise continue on flecainide and Coumadin. Follow-up arranged.  2. Previous ischemic workup in August of this year was low risk.  Current medicines were reviewed with the patient today.   Orders Placed This Encounter  Procedures  . ECHOCARDIOGRAM COMPLETE    Disposition: FU with me in 1 month.   Signed, Jonelle Sidle, MD, [facility] 02/27/2015 12:00 PM    [facility] at [facility] [address] [place], [place], [place] [address] Phone: [phone]; Fax: [phone]

## 2015-02-27 NOTE — Patient Instructions (Addendum)
   Increase Toprol XL to 50mg  twice a day  - [facility].  Increase your Lasix to 40mg  daily through the weekend, then change to 20mg  daily thereafter. - [facility].  Continue all other medications.   Your physician has requested that you have an echocardiogram. Echocardiography is a painless test that uses sound waves to create images of your heart. It provides your doctor with information about the size and shape of your heart and how well your heart's chambers and valves are working. This procedure takes approximately one hour. There are no restrictions for this procedure. Office will contact with results via phone or letter.   Follow up in  1 month.

## 2015-03-05 ENCOUNTER — Ambulatory Visit (INDEPENDENT_AMBULATORY_CARE_PROVIDER_SITE_OTHER): Payer: Medicare Other

## 2015-03-05 ENCOUNTER — Other Ambulatory Visit: Payer: Self-pay

## 2015-03-05 DIAGNOSIS — R0602 Shortness of breath: Secondary | ICD-10-CM | POA: Diagnosis not present

## 2015-03-05 DIAGNOSIS — I481 Persistent atrial fibrillation: Secondary | ICD-10-CM

## 2015-03-05 DIAGNOSIS — I4819 Other persistent atrial fibrillation: Secondary | ICD-10-CM

## 2015-03-06 ENCOUNTER — Encounter: Payer: Self-pay | Admitting: *Deleted

## 2015-03-06 ENCOUNTER — Telehealth: Payer: Self-pay | Admitting: *Deleted

## 2015-03-06 DIAGNOSIS — I10 Essential (primary) hypertension: Secondary | ICD-10-CM | POA: Diagnosis not present

## 2015-03-06 DIAGNOSIS — Z418 Encounter for other procedures for purposes other than remedying health state: Secondary | ICD-10-CM | POA: Diagnosis not present

## 2015-03-06 DIAGNOSIS — I4891 Unspecified atrial fibrillation: Secondary | ICD-10-CM | POA: Diagnosis not present

## 2015-03-06 DIAGNOSIS — E7800 Pure hypercholesterolemia, unspecified: Secondary | ICD-10-CM | POA: Diagnosis not present

## 2015-03-06 DIAGNOSIS — I5030 Unspecified diastolic (congestive) heart failure: Secondary | ICD-10-CM | POA: Diagnosis not present

## 2015-03-06 NOTE — Telephone Encounter (Signed)
-----   Message from Jonelle SidleSamuel G McDowell, MD sent at 03/05/2015  4:38 PM EST ----- Reviewed report. LVEF remains normal range at 55-60%. This was my major concern to reevaluate to make sure that she did not have a developing cardiomyopathy. We will see how she is doing in the office as scheduled following her recent medication adjustments.

## 2015-03-07 NOTE — Telephone Encounter (Signed)
Patient informed. 

## 2015-03-13 DIAGNOSIS — Z789 Other specified health status: Secondary | ICD-10-CM | POA: Diagnosis not present

## 2015-03-13 DIAGNOSIS — Z6833 Body mass index (BMI) 33.0-33.9, adult: Secondary | ICD-10-CM | POA: Diagnosis not present

## 2015-03-13 DIAGNOSIS — M25562 Pain in left knee: Secondary | ICD-10-CM | POA: Diagnosis not present

## 2015-04-03 DIAGNOSIS — I4891 Unspecified atrial fibrillation: Secondary | ICD-10-CM | POA: Diagnosis not present

## 2015-04-07 ENCOUNTER — Encounter: Payer: Self-pay | Admitting: Cardiology

## 2015-04-07 ENCOUNTER — Ambulatory Visit (INDEPENDENT_AMBULATORY_CARE_PROVIDER_SITE_OTHER): Payer: Medicare Other | Admitting: Cardiology

## 2015-04-07 VITALS — BP 136/90 | HR 72 | Ht 62.0 in | Wt 186.0 lb

## 2015-04-07 DIAGNOSIS — IMO0001 Reserved for inherently not codable concepts without codable children: Secondary | ICD-10-CM

## 2015-04-07 DIAGNOSIS — E785 Hyperlipidemia, unspecified: Secondary | ICD-10-CM | POA: Diagnosis not present

## 2015-04-07 DIAGNOSIS — R030 Elevated blood-pressure reading, without diagnosis of hypertension: Secondary | ICD-10-CM

## 2015-04-07 DIAGNOSIS — I5032 Chronic diastolic (congestive) heart failure: Secondary | ICD-10-CM | POA: Diagnosis not present

## 2015-04-07 DIAGNOSIS — I480 Paroxysmal atrial fibrillation: Secondary | ICD-10-CM | POA: Diagnosis not present

## 2015-04-07 MED ORDER — FLECAINIDE ACETATE 100 MG PO TABS: 100.0000 mg | ORAL_TABLET | Freq: Two times a day (BID) | ORAL | Status: DC

## 2015-04-07 NOTE — Patient Instructions (Signed)
Your physician has recommended you make the following change in your medication:  Increase flecainide to 100 mg twice daily. Please take (2) of your 50 mg tablets twice daily until they are finished. Continue all other medications the same. Your physician recommends that you schedule a follow-up appointment in: 1 month.

## 2015-04-07 NOTE — Progress Notes (Signed)
Cardiology Office Note  Date: 04/07/2015   ID: OMER MONTER, DOB Jan 30, 1929, MRN [id number]  PCP: Ignatius Specking., MD  Primary Cardiologist: Nona Dell, MD   Chief Complaint  Patient presents with  . Atrial Fibrillation    History of Present Illness: Danielle Brooks is an 82 y.o. female seen in December 2016. At the last visit we increased her Toprol-XL dose and otherwise continued flecainide with Coumadin. A follow-up echocardiogram was also ordered.  She comes in today with her husband for a follow-up visit. Still has episodes of sudden onset palpitations and anxiety, presumably recurrent atrial fibrillation. This is better than it was when she was not on flecainide, but still not optimally controlled. Her husband has been concerned about it as well. She has had some mild lightheadedness with the palpitations but no syncope.  We reviewed her medications which are outlined below.she is on flecainide, Toprol-XL, and Coumadin. No reported bleeding problems.  Today we discussed advancing flecainide to 100 mg twice daily to see if this provides better rhythm control. She seems to be tolerating the Toprol-XL and her heart rate is in the 70s today. We went over the results of her echocardiogram which showed preserved LVEF. She still has leg edema and takes a low-dose diuretic.  Past Medical History  Diagnosis Date  . Osteoporosis   . Atrial fibrillation (HCC)     Paroxysmal, history of normal LV and negative ischemic workup  . Vitamin D deficiency   . Mixed hyperlipidemia   . TIA (transient ischemic attack)     History reviewed. No pertinent past surgical history.  Current Outpatient Prescriptions  Medication Sig Dispense Refill  . furosemide (LASIX) 20 MG tablet Take 1 tablet (20 mg total) by mouth daily. 30 tablet 6  . metoprolol succinate (TOPROL-XL) 50 MG 24 hr tablet Take 1 tablet (50 mg total) by mouth 2 (two) times daily. Dose increased 02/27/2015. 60 tablet 6  . potassium  chloride SA (K-DUR,KLOR-CON) 20 MEQ tablet Take 20 mEq by mouth every other day.     . traZODone (DESYREL) 100 MG tablet take 1/2 tablet by mouth at bedtime **MAY INCREASE TO 1 TABLET AT BEDTIME IF NEEDED** 30 tablet 2  . Vitamin D, Ergocalciferol, (DRISDOL) 50000 UNITS CAPS Take 50,000 Units by mouth every 7 (seven) days.    Marland Kitchen warfarin (COUMADIN) 5 MG tablet Take 5 mg by mouth as directed. Managed by [facility] office    . flecainide (TAMBOCOR) 100 MG tablet Take 1 tablet (100 mg total) by mouth 2 (two) times daily. 180 tablet 0   No current facility-administered medications for this visit.   Allergies:  Bactrim; Codeine; and Novocain   Social History: The patient  reports that she has never smoked. She has never used smokeless tobacco. She reports that she does not drink alcohol or use illicit drugs.   ROS:  Please see the history of present illness. Otherwise, complete review of systems is positive for anxiety, arthritic pains in her legs.  All other systems are reviewed and negative.   Physical Exam: VS:  BP 136/90 mmHg  Pulse 72  Ht  (1.575 m)  Wt 186 lb (84.369 kg)  BMI 34.01 kg/m2  SpO2 97%, BMI Body mass index is 34.01 kg/(m^2).  Wt Readings from Last 3 Encounters:  04/07/15 186 lb (84.369 kg)  02/27/15 192 lb (87.091 kg)  11/29/14 185 lb 3.2 oz (84.006 kg)    Overweight woman, no distress. HEENT: Conjunctiva and lids normal,  oropharynx clear.  Neck: Supple, no elevated JVP or carotid bruits, no thyromegaly.  Lungs: Clear to auscultation, nonlabored breathing at rest.  Cardiac: Regular with ectopy, no S3 or significant systolic murmur, no pericardial rub.  Abdomen: Soft, nontender, bowel sounds present.  Extremities: 2+ leg edema, distal pulses 2+. Skin: Warm and dry. Musculoskeletal: No kyphosis. Neuropsychiatric: Alert and oriented 3, affect appropriate.  ECG: ECG is not ordered today.  Recent Labwork: 10/23/2014: ALT 13*; AST 20; TSH 1.164   Other Studies  Reviewed Today:  Echocardiogram 03/05/2015: Study Conclusions  - Left ventricle: The cavity size was normal. Wall thickness was normal. Systolic function was normal. The estimated ejection fraction was in the range of 55% to 60%. Wall motion was normal; there were no regional wall motion abnormalities. - Aortic valve: Mildly to moderately calcified annulus. Trileaflet; mildly thickened leaflets. There was mild to moderate regurgitation. Valve area (VTI): 2.9 cm^2. Valve area (Vmax): 2.72 cm^2. Valve area (Vmean): 2.74 cm^2. - Mitral valve: Mildly calcified annulus. Mildly thickened leaflets . There was mild regurgitation. - Left atrium: The atrium was mildly dilated. - Right atrium: The atrium was mildly dilated. - Pulmonary arteries: Systolic pressure was mildly to moderately increased. PA peak pressure: 40 mm Hg (S). - Technically adequate study.  Lexiscan Cardiolite 10/23/2014: Atrial fibrillation throughout with no diagnostic ST segment changes, probable variable soft tissue attenuation affecting the mid anterolateral and apical anteroseptal walls, overall low risk, LVEF 64%.  Assessment and Plan:  1. Paroxysmal atrial fibrillation. She still reports palpitations and is not complete satisfied with her symptom control. We will continue Toprol-XL at current dose as well as Coumadin, but increase flecainide to 100 mg twice daily. Hopefully this provided better rhythm control. Follow-up with ECG arranged.  2. Leg edema, likely component of diastolic dysfunction exacerbated by atrial fibrillation. Recent echocardiogram showed LVEF 55-60%. She will continue on low-dose Lasix. Her weight is down compared to the last visit.  3. Blood pressure is mildly elevated today, new compared to last visit. We discussed sodium restriction. No new medications were added. She should keep follow-up with Dr. Sherril Croon.  4. Diet managed hyperlipidemia.  Current medicines were reviewed with the  patient today.  Disposition: FU with me in 1 month.   Signed, Jonelle Sidle, MD, [facility] 04/07/2015 2:07 PM    [facility] at [facility] [address] [place], [place], [place] [address] Phone: [phone]; Fax: [phone]

## 2015-05-08 ENCOUNTER — Encounter: Payer: Self-pay | Admitting: Cardiology

## 2015-05-08 ENCOUNTER — Ambulatory Visit (INDEPENDENT_AMBULATORY_CARE_PROVIDER_SITE_OTHER): Payer: Medicare Other | Admitting: Cardiology

## 2015-05-08 VITALS — BP 124/69 | HR 51 | Ht 62.0 in | Wt 186.4 lb

## 2015-05-08 DIAGNOSIS — R531 Weakness: Secondary | ICD-10-CM | POA: Diagnosis not present

## 2015-05-08 DIAGNOSIS — I480 Paroxysmal atrial fibrillation: Secondary | ICD-10-CM

## 2015-05-08 NOTE — Patient Instructions (Signed)
Your physician recommends that you continue on your current medications as directed. Please refer to the Current Medication list given to you today.  Your physician recommends that you schedule a follow-up appointment in: 3 months  

## 2015-05-08 NOTE — Progress Notes (Signed)
Cardiology Office Note  Date: 05/08/2015   ID: Danielle FallsJoy Y Franey, DOB 1928-08-15, MRN [id number]  PCP: Ignatius SpeckingVYAS,DHRUV B., MD  Primary Cardiologist: Nona DellSamuel Genoa Freyre, MD   Chief Complaint  Patient presents with  . PAF    History of Present Illness: Danielle Brooks is an 82 y.o. female last seen in February. At that time we increased flecainide to 100 mg twice daily. She is here today with her husband for a follow-up visit. She does report improvement in palpitations since last visit. Still has episodes where she feels palpitations with breathlessness and chest discomfort.  She complains of relative weakness, difficulty getting up out of a chair sometimes, also arm weakness. Not specifically related to her palpitations. She plans to see Dr. Sherril CroonVyas next week for a routine visit. Physical therapy evaluation might be a good option for her.  I reviewed her tracing today which shows sinus bradycardia with prolonged PR interval and QRS duration 86 ms.  Past Medical History  Diagnosis Date  . Osteoporosis   . Atrial fibrillation (HCC)     Paroxysmal, history of normal LV and negative ischemic workup  . Vitamin D deficiency   . Mixed hyperlipidemia   . TIA (transient ischemic attack)     Current Outpatient Prescriptions  Medication Sig Dispense Refill  . flecainide (TAMBOCOR) 100 MG tablet Take 1 tablet (100 mg total) by mouth 2 (two) times daily. 180 tablet 0  . furosemide (LASIX) 20 MG tablet Take 1 tablet (20 mg total) by mouth daily. 30 tablet 6  . metoprolol succinate (TOPROL-XL) 50 MG 24 hr tablet Take 1 tablet (50 mg total) by mouth 2 (two) times daily. Dose increased 02/27/2015. 60 tablet 6  . potassium chloride SA (K-DUR,KLOR-CON) 20 MEQ tablet Take 20 mEq by mouth every other day.     . traZODone (DESYREL) 100 MG tablet take 1/2 tablet by mouth at bedtime **MAY INCREASE TO 1 TABLET AT BEDTIME IF NEEDED** 30 tablet 2  . Vitamin D, Ergocalciferol, (DRISDOL) 50000 UNITS CAPS Take 50,000 Units by  mouth every 7 (seven) days.    Marland Kitchen. warfarin (COUMADIN) 5 MG tablet Take 5 mg by mouth as directed. Managed by [facility] office     No current facility-administered medications for this visit.   Allergies:  Bactrim; Codeine; and Novocain   Social History: The patient  reports that she has never smoked. She has never used smokeless tobacco. She reports that she does not drink alcohol or use illicit drugs.    ROS:  Please see the history of present illness. Otherwise, complete review of systems is positive for weakness as outlined.  All other systems are reviewed and negative.   Physical Exam: VS:  BP 124/69 mmHg  Pulse 51  Ht 5\' 2"  (1.575 m)  Wt 186 lb 6.4 oz (84.55 kg)  BMI 34.08 kg/m2  SpO2 95%, BMI Body mass index is 34.08 kg/(m^2).  Wt Readings from Last 3 Encounters:  05/08/15 186 lb 6.4 oz (84.55 kg)  04/07/15 186 lb (84.369 kg)  02/27/15 192 lb (87.091 kg)    Overweight woman, no distress. HEENT: Conjunctiva and lids normal, oropharynx clear.  Neck: Supple, no elevated JVP or carotid bruits, no thyromegaly.  Lungs: Clear to auscultation, nonlabored breathing at rest.  Cardiac: Regular with ectopy, no S3 or significant systolic murmur, no pericardial rub.  Abdomen: Soft, nontender, bowel sounds present.  Extremities: 1-2+ leg edema, distal pulses 2+.  ECG: I personally reviewed the prior tracing from 10/15/2014 which  showed rate-controlled atrial fibrillation.  Recent Labwork: 10/23/2014: ALT 13*; AST 20; TSH 1.164   Other Studies Reviewed Today:  Echocardiogram 03/05/2015: Study Conclusions  - Left ventricle: The cavity size was normal. Wall thickness was normal. Systolic function was normal. The estimated ejection fraction was in the range of 55% to 60%. Wall motion was normal; there were no regional wall motion abnormalities. - Aortic valve: Mildly to moderately calcified annulus. Trileaflet; mildly thickened leaflets. There was mild to  moderate regurgitation. Valve area (VTI): 2.9 cm^2. Valve area (Vmax): 2.72 cm^2. Valve area (Vmean): 2.74 cm^2. - Mitral valve: Mildly calcified annulus. Mildly thickened leaflets . There was mild regurgitation. - Left atrium: The atrium was mildly dilated. - Right atrium: The atrium was mildly dilated. - Pulmonary arteries: Systolic pressure was mildly to moderately increased. PA peak pressure: 40 mm Hg (S). - Technically adequate study.  Lexiscan Cardiolite 10/23/2014: Atrial fibrillation throughout with no diagnostic ST segment changes, probable variable soft tissue attenuation affecting the mid anterolateral and apical anteroseptal walls, overall low risk, LVEF 64%.  Assessment and Plan:  1. Paroxysmal atrial fibrillation, symptomatically somewhat better since increasing flecainide 100 mg twice daily. ECG reviewed with normal QRS duration and QTc. Continue current regimen including Coumadin.  2. Generalized weakness and muscle weakness. She plans to see Dr. Sherril Croon next week. She may be a good candidate for physical therapy evaluation and treatment.  Current medicines were reviewed with the patient today.   Orders Placed This Encounter  Procedures  . EKG 12-Lead    Disposition: FU with me in 3 months.   Signed, Jonelle Sidle, MD, [facility] 05/08/2015 1:26 PM    [facility] at [facility] [address] [place], [place], [place] [address] Phone: [phone]; Fax: [phone]

## 2015-05-27 DIAGNOSIS — M179 Osteoarthritis of knee, unspecified: Secondary | ICD-10-CM | POA: Diagnosis not present

## 2015-05-27 DIAGNOSIS — I4891 Unspecified atrial fibrillation: Secondary | ICD-10-CM | POA: Diagnosis not present

## 2015-05-27 DIAGNOSIS — M25562 Pain in left knee: Secondary | ICD-10-CM | POA: Diagnosis not present

## 2015-05-27 DIAGNOSIS — I10 Essential (primary) hypertension: Secondary | ICD-10-CM | POA: Diagnosis not present

## 2015-06-02 DIAGNOSIS — M25562 Pain in left knee: Secondary | ICD-10-CM | POA: Diagnosis not present

## 2015-06-02 DIAGNOSIS — R2689 Other abnormalities of gait and mobility: Secondary | ICD-10-CM | POA: Diagnosis not present

## 2015-06-02 DIAGNOSIS — M25561 Pain in right knee: Secondary | ICD-10-CM | POA: Diagnosis not present

## 2015-06-05 DIAGNOSIS — M25562 Pain in left knee: Secondary | ICD-10-CM | POA: Diagnosis not present

## 2015-06-05 DIAGNOSIS — R2689 Other abnormalities of gait and mobility: Secondary | ICD-10-CM | POA: Diagnosis not present

## 2015-06-05 DIAGNOSIS — M25561 Pain in right knee: Secondary | ICD-10-CM | POA: Diagnosis not present

## 2015-06-06 DIAGNOSIS — M25562 Pain in left knee: Secondary | ICD-10-CM | POA: Diagnosis not present

## 2015-06-06 DIAGNOSIS — M25561 Pain in right knee: Secondary | ICD-10-CM | POA: Diagnosis not present

## 2015-06-06 DIAGNOSIS — R2689 Other abnormalities of gait and mobility: Secondary | ICD-10-CM | POA: Diagnosis not present

## 2015-06-09 DIAGNOSIS — M25562 Pain in left knee: Secondary | ICD-10-CM | POA: Diagnosis not present

## 2015-06-09 DIAGNOSIS — R2689 Other abnormalities of gait and mobility: Secondary | ICD-10-CM | POA: Diagnosis not present

## 2015-06-09 DIAGNOSIS — M25561 Pain in right knee: Secondary | ICD-10-CM | POA: Diagnosis not present

## 2015-06-10 DIAGNOSIS — I10 Essential (primary) hypertension: Secondary | ICD-10-CM | POA: Diagnosis not present

## 2015-06-10 DIAGNOSIS — I4891 Unspecified atrial fibrillation: Secondary | ICD-10-CM | POA: Diagnosis not present

## 2015-06-10 DIAGNOSIS — F419 Anxiety disorder, unspecified: Secondary | ICD-10-CM | POA: Diagnosis not present

## 2015-06-10 DIAGNOSIS — R0600 Dyspnea, unspecified: Secondary | ICD-10-CM | POA: Diagnosis not present

## 2015-06-12 DIAGNOSIS — M25562 Pain in left knee: Secondary | ICD-10-CM | POA: Diagnosis not present

## 2015-06-12 DIAGNOSIS — M25561 Pain in right knee: Secondary | ICD-10-CM | POA: Diagnosis not present

## 2015-06-12 DIAGNOSIS — R2689 Other abnormalities of gait and mobility: Secondary | ICD-10-CM | POA: Diagnosis not present

## 2015-06-16 DIAGNOSIS — M25561 Pain in right knee: Secondary | ICD-10-CM | POA: Diagnosis not present

## 2015-06-16 DIAGNOSIS — M25562 Pain in left knee: Secondary | ICD-10-CM | POA: Diagnosis not present

## 2015-06-16 DIAGNOSIS — R2689 Other abnormalities of gait and mobility: Secondary | ICD-10-CM | POA: Diagnosis not present

## 2015-06-18 ENCOUNTER — Telehealth: Payer: Self-pay | Admitting: Cardiology

## 2015-06-18 NOTE — Telephone Encounter (Signed)
Mrs. Colbert Brooks called stating that yesterday she was playing her piano and all of a sudden her eyesight went away  for approximately 10 seconds. She states that she was very fatigue after that. Her BP  Yesterday was 106/50 HR 57 today she had (2) readings of 156/67 HR 55 @ 10AM 127.64 HR 53 @ 12 noon. Patient did not know if it was a medication issue or did she need to be Seen.

## 2015-06-18 NOTE — Telephone Encounter (Signed)
Agree. Concern would be whether any of these events are neurological in etiology, even though she is on Coumadin for stroke prophylaxis. She should be seen urgently in the ER for neuroimaging if symptoms recur. May want to discuss the symptoms with her PCP as well to see whether neurological referral would be otherwise considered.

## 2015-06-18 NOTE — Telephone Encounter (Signed)
Patient c/o fatigue, and intermittent sob for the last few days and says her hands feel like something is pulling them. After playing the piano yesterday afternoon, patient reported to nurse that she had a black out spell that lasted for about 10 seconds. Patient did not check her BP or HR during that time but she did lay down. Patient said she took her BP one hour afterward the black out spell and her BP was  136/65 & HR 48. Today around noon, patient's BP 156/67 & HR 65 and about 30-45 minutes later, BP 127/64 & HR 53. No c/o chest pain or dizziness. Patient said she continues to have intermittent sob especially with activity but has not had any more black out spells. Patient said she is drinking one bottle of water per day and says she does eat okay without any nausea. Patient advised that if she has another black out spell that she needed to go to the ED right away and if she could, get her BP and HR shortly there afterwards. Patient also advised to monitor her BP and HR no more than twice daily using the same machine, same arm and taking it at the same time of the day. Patient advised that message would be sent to MD for further advice. Patient verbalized understanding of plan.

## 2015-06-19 DIAGNOSIS — I4891 Unspecified atrial fibrillation: Secondary | ICD-10-CM | POA: Diagnosis not present

## 2015-06-19 DIAGNOSIS — E7800 Pure hypercholesterolemia, unspecified: Secondary | ICD-10-CM | POA: Diagnosis not present

## 2015-06-19 DIAGNOSIS — I10 Essential (primary) hypertension: Secondary | ICD-10-CM | POA: Diagnosis not present

## 2015-06-19 NOTE — Telephone Encounter (Signed)
Patient informed and verbalized understanding of plan. 

## 2015-06-20 DIAGNOSIS — M25562 Pain in left knee: Secondary | ICD-10-CM | POA: Diagnosis not present

## 2015-06-20 DIAGNOSIS — M25561 Pain in right knee: Secondary | ICD-10-CM | POA: Diagnosis not present

## 2015-06-20 DIAGNOSIS — R2689 Other abnormalities of gait and mobility: Secondary | ICD-10-CM | POA: Diagnosis not present

## 2015-06-23 DIAGNOSIS — R2689 Other abnormalities of gait and mobility: Secondary | ICD-10-CM | POA: Diagnosis not present

## 2015-06-23 DIAGNOSIS — M25561 Pain in right knee: Secondary | ICD-10-CM | POA: Diagnosis not present

## 2015-06-23 DIAGNOSIS — M25562 Pain in left knee: Secondary | ICD-10-CM | POA: Diagnosis not present

## 2015-06-24 DIAGNOSIS — R2689 Other abnormalities of gait and mobility: Secondary | ICD-10-CM | POA: Diagnosis not present

## 2015-06-24 DIAGNOSIS — M25561 Pain in right knee: Secondary | ICD-10-CM | POA: Diagnosis not present

## 2015-06-24 DIAGNOSIS — M25562 Pain in left knee: Secondary | ICD-10-CM | POA: Diagnosis not present

## 2015-06-26 DIAGNOSIS — Z0000 Encounter for general adult medical examination without abnormal findings: Secondary | ICD-10-CM | POA: Diagnosis not present

## 2015-06-26 DIAGNOSIS — Z1389 Encounter for screening for other disorder: Secondary | ICD-10-CM | POA: Diagnosis not present

## 2015-06-26 DIAGNOSIS — R55 Syncope and collapse: Secondary | ICD-10-CM | POA: Diagnosis not present

## 2015-06-26 DIAGNOSIS — R5383 Other fatigue: Secondary | ICD-10-CM | POA: Diagnosis not present

## 2015-06-26 DIAGNOSIS — Z1211 Encounter for screening for malignant neoplasm of colon: Secondary | ICD-10-CM | POA: Diagnosis not present

## 2015-06-26 DIAGNOSIS — M25562 Pain in left knee: Secondary | ICD-10-CM | POA: Diagnosis not present

## 2015-06-26 DIAGNOSIS — Z79899 Other long term (current) drug therapy: Secondary | ICD-10-CM | POA: Diagnosis not present

## 2015-06-26 DIAGNOSIS — Z299 Encounter for prophylactic measures, unspecified: Secondary | ICD-10-CM | POA: Diagnosis not present

## 2015-06-26 DIAGNOSIS — Z7189 Other specified counseling: Secondary | ICD-10-CM | POA: Diagnosis not present

## 2015-06-26 DIAGNOSIS — E7800 Pure hypercholesterolemia, unspecified: Secondary | ICD-10-CM | POA: Diagnosis not present

## 2015-06-26 DIAGNOSIS — E559 Vitamin D deficiency, unspecified: Secondary | ICD-10-CM | POA: Diagnosis not present

## 2015-07-23 ENCOUNTER — Other Ambulatory Visit: Payer: Self-pay | Admitting: *Deleted

## 2015-07-23 MED ORDER — FLECAINIDE ACETATE 100 MG PO TABS: 100.0000 mg | ORAL_TABLET | Freq: Two times a day (BID) | ORAL | Status: DC

## 2015-08-08 ENCOUNTER — Ambulatory Visit (INDEPENDENT_AMBULATORY_CARE_PROVIDER_SITE_OTHER): Payer: Medicare Other | Admitting: Cardiology

## 2015-08-08 ENCOUNTER — Encounter: Payer: Self-pay | Admitting: Cardiology

## 2015-08-08 VITALS — BP 168/82 | HR 60 | Ht 62.0 in | Wt 182.6 lb

## 2015-08-08 DIAGNOSIS — Z8673 Personal history of transient ischemic attack (TIA), and cerebral infarction without residual deficits: Secondary | ICD-10-CM

## 2015-08-08 DIAGNOSIS — R0600 Dyspnea, unspecified: Secondary | ICD-10-CM

## 2015-08-08 DIAGNOSIS — I480 Paroxysmal atrial fibrillation: Secondary | ICD-10-CM

## 2015-08-08 MED ORDER — FUROSEMIDE 20 MG PO TABS: ORAL_TABLET | ORAL | Status: DC

## 2015-08-08 NOTE — Progress Notes (Signed)
Cardiology Office Note  Date: 08/08/2015   ID: Danielle FallsJoy Y Mcmanaway, DOB 28-Sep-1928, MRN [id number]  PCP: Ignatius Speckinghruv B Vyas, MD  Primary Cardiologist: Nona DellSamuel Evelyn Aguinaldo, MD   Chief Complaint  Patient presents with  . Atrial Fibrillation  . Shortness of Breath    History of Present Illness: Danielle Brooks is an 82 y.o. female last seen in March. She is here today with her husband for a follow-up visit. She does not endorse any specific palpitations but still complains of dyspnea on exertion and just a generalized feeling of being short of breath. It almost seems to be related to anxiety in some respects based on description. She is on Lasix for suspected diastolic heart failure and her last PASP was 40 mmHg. No definitive ischemic defects based on Cardiolite testing from last year.  We went over her medications. Flecainide was last medication to be increased for better control of PAF. This could potentially be related, but she does report better control of palpitations. We also increased her Toprol-XL about 6 months ago. I had her walk in the office with oxygen saturation monitor and it got no less than 91-92% on room air. She does not report any wheezing.  Past Medical History  Diagnosis Date  . Osteoporosis   . Atrial fibrillation (HCC)     Paroxysmal, history of normal LV and negative ischemic workup  . Vitamin D deficiency   . Mixed hyperlipidemia   . TIA (transient ischemic attack)     History reviewed. No pertinent past surgical history.  Current Outpatient Prescriptions  Medication Sig Dispense Refill  . flecainide (TAMBOCOR) 100 MG tablet Take 1 tablet (100 mg total) by mouth 2 (two) times daily. 180 tablet 3  . metoprolol succinate (TOPROL-XL) 50 MG 24 hr tablet Take 1 tablet (50 mg total) by mouth 2 (two) times daily. Dose increased 02/27/2015. 60 tablet 6  . potassium chloride SA (K-DUR,KLOR-CON) 20 MEQ tablet Take 20 mEq by mouth every other day.     . traZODone (DESYREL) 100 MG tablet  take 1/2 tablet by mouth at bedtime **MAY INCREASE TO 1 TABLET AT BEDTIME IF NEEDED** 30 tablet 2  . Vitamin D, Ergocalciferol, (DRISDOL) 50000 UNITS CAPS Take 50,000 Units by mouth every 7 (seven) days.    Marland Kitchen. warfarin (COUMADIN) 5 MG tablet Take 5 mg by mouth as directed. [facility]    . furosemide (LASIX) 20 MG tablet Take 2 tabs (40mg ) on Monday Wednesday Friday alternating with 20 mg 1 tab 180 tablet 3   No current facility-administered medications for this visit.   Allergies:  Bactrim; Codeine; and Novocain   Social History: The patient  reports that she has never smoked. She has never used smokeless tobacco. She reports that she does not drink alcohol or use illicit drugs.   ROS:  Please see the history of present illness. Otherwise, complete review of systems is positive for anxiety.  All other systems are reviewed and negative.   Physical Exam: VS:  BP 168/82 mmHg  Pulse 60  Ht 5\' 2"  (1.575 m)  Wt 182 lb 9.6 oz (82.827 kg)  BMI 33.39 kg/m2  SpO2 97%, BMI Body mass index is 33.39 kg/(m^2).  Wt Readings from Last 3 Encounters:  08/08/15 182 lb 9.6 oz (82.827 kg)  05/08/15 186 lb 6.4 oz (84.55 kg)  04/07/15 186 lb (84.369 kg)    Overweight woman, no distress. HEENT: Conjunctiva and lids normal, oropharynx clear.  Neck: Supple, no elevated  JVP or carotid bruits, no thyromegaly.  Lungs: Clear to auscultation, nonlabored breathing at rest.  Cardiac: Regular with ectopy, no S3 or significant systolic murmur, no pericardial rub.  Abdomen: Soft, nontender, bowel sounds present.  Extremities: 1-2+ leg edema, distal pulses 2+.  ECG: I personally reviewed the tracing from 05/08/2015 which showed sinus bradycardia with prolonged PR interval and QRS duration 86 ms, normal QTC.  Recent Labwork: 10/23/2014: ALT 13*; AST 20; TSH 1.164   Other Studies Reviewed Today:  Echocardiogram 03/05/2015: Study Conclusions  - Left ventricle: The cavity size was normal. Wall  thickness was normal. Systolic function was normal. The estimated ejection fraction was in the range of 55% to 60%. Wall motion was normal; there were no regional wall motion abnormalities. - Aortic valve: Mildly to moderately calcified annulus. Trileaflet; mildly thickened leaflets. There was mild to moderate regurgitation. Valve area (VTI): 2.9 cm^2. Valve area (Vmax): 2.72 cm^2. Valve area (Vmean): 2.74 cm^2. - Mitral valve: Mildly calcified annulus. Mildly thickened leaflets . There was mild regurgitation. - Left atrium: The atrium was mildly dilated. - Right atrium: The atrium was mildly dilated. - Pulmonary arteries: Systolic pressure was mildly to moderately increased. PA peak pressure: 40 mm Hg (S). - Technically adequate study.  Lexiscan Cardiolite 10/23/2014: Atrial fibrillation throughout with no diagnostic ST segment changes, probable variable soft tissue attenuation affecting the mid anterolateral and apical anteroseptal walls, overall low risk, LVEF 64%.  Assessment and Plan:  1. Shortness of breath. Probably multifactorial. Our plan for now is to increase Lasix to 40 mg on Monday, Wednesday, and Friday with otherwise baseline dose at 20 mg daily. If this is not effective, we could consider reducing dose of Toprol-XL next to see if this makes a difference. Lastly we would consider cutting back flecainide, although this may not provide as good a control of her PAF. She otherwise reports compliance with Coumadin and no bleeding problems.  2. History of hyperlipidemia, managed via diet.  3. Prior history of TIA, no new events.  Current medicines were reviewed with the patient today.  Disposition: FU with me in 2 months.   Signed, Jonelle Sidle, MD, [facility] 08/08/2015 1:40 PM    [facility] at [facility] [address] [place], [place], [place] [address] Phone: [phone]; Fax: [phone]

## 2015-08-08 NOTE — Patient Instructions (Signed)
Your physician recommends that you schedule a follow-up appointment in: 2 MONTH [facility]  Your physician has recommended you make the following change in your medication:   INCREASE LASIX 40 MG (2 TABLETS) ON Monday Wednesday AND Friday - TAKE 20 MG (1 TAB) ON ALTERNATE DAYS  PLEASE CALL US IF LASIX DOES NOT HELP WE WILL CHANGE METOPROLOL DOSE   Thank you for choosing [facility]!!

## 2015-08-18 DIAGNOSIS — E7800 Pure hypercholesterolemia, unspecified: Secondary | ICD-10-CM | POA: Diagnosis not present

## 2015-08-18 DIAGNOSIS — I4891 Unspecified atrial fibrillation: Secondary | ICD-10-CM | POA: Diagnosis not present

## 2015-08-18 DIAGNOSIS — I10 Essential (primary) hypertension: Secondary | ICD-10-CM | POA: Diagnosis not present

## 2015-09-23 DIAGNOSIS — I4891 Unspecified atrial fibrillation: Secondary | ICD-10-CM | POA: Diagnosis not present

## 2015-09-23 DIAGNOSIS — I509 Heart failure, unspecified: Secondary | ICD-10-CM | POA: Diagnosis not present

## 2015-09-23 DIAGNOSIS — E7800 Pure hypercholesterolemia, unspecified: Secondary | ICD-10-CM | POA: Diagnosis not present

## 2015-09-23 DIAGNOSIS — I10 Essential (primary) hypertension: Secondary | ICD-10-CM | POA: Diagnosis not present

## 2015-09-25 DIAGNOSIS — Z299 Encounter for prophylactic measures, unspecified: Secondary | ICD-10-CM | POA: Diagnosis not present

## 2015-09-25 DIAGNOSIS — B356 Tinea cruris: Secondary | ICD-10-CM | POA: Diagnosis not present

## 2015-09-25 DIAGNOSIS — I4891 Unspecified atrial fibrillation: Secondary | ICD-10-CM | POA: Diagnosis not present

## 2015-09-25 DIAGNOSIS — Z1389 Encounter for screening for other disorder: Secondary | ICD-10-CM | POA: Diagnosis not present

## 2015-10-07 ENCOUNTER — Other Ambulatory Visit: Payer: Self-pay | Admitting: Cardiology

## 2015-10-13 DIAGNOSIS — I10 Essential (primary) hypertension: Secondary | ICD-10-CM | POA: Diagnosis not present

## 2015-10-13 DIAGNOSIS — I4891 Unspecified atrial fibrillation: Secondary | ICD-10-CM | POA: Diagnosis not present

## 2015-10-13 DIAGNOSIS — I509 Heart failure, unspecified: Secondary | ICD-10-CM | POA: Diagnosis not present

## 2015-10-13 DIAGNOSIS — E7800 Pure hypercholesterolemia, unspecified: Secondary | ICD-10-CM | POA: Diagnosis not present

## 2015-10-16 NOTE — Progress Notes (Signed)
Cardiology Office Note  Date: 10/17/2015   ID: Danielle Brooks, DOB [birth year], MRN [id number]  PCP: Ignatius Speckinghruv B Vyas, MD  Primary Cardiologist: Nona DellSamuel McDowell, MD   Chief Complaint  Patient presents with  . Atrial Fibrillation    History of Present Illness:  Danielle Brooks is an 82 y.o. female last seen in June. At the last visit we increased her Lasix dose with plan to consider reducing Toprol-XL and potentially even cutting back flecainide depending on her overall symptoms. Fortunately, she states she feels much better, less shortness of breath. She has been tolerating her current medications well. Her husband confirms this.  She does not report any palpitations or chest pain at this time.  Past Medical History:  Diagnosis Date  . Atrial fibrillation (HCC)    Paroxysmal, history of normal LV and negative ischemic workup  . Mixed hyperlipidemia   . Osteoporosis   . TIA (transient ischemic attack)   . Vitamin D deficiency     Current Outpatient Prescriptions  Medication Sig Dispense Refill  . docusate sodium (COLACE) 100 MG capsule Take 100 mg by mouth as needed for mild constipation.    . flecainide (TAMBOCOR) 100 MG tablet Take 1 tablet (100 mg total) by mouth 2 (two) times daily. 180 tablet 3  . furosemide (LASIX) 20 MG tablet Take 2 tabs (40mg ) on Monday Wednesday Friday alternating with 20 mg 1 tab 180 tablet 3  . metoprolol succinate (TOPROL-XL) 50 MG 24 hr tablet take 1 tablet by mouth twice a day --DOSE INCREASED 02/27/2015 60 tablet 6  . potassium chloride SA (K-DUR,KLOR-CON) 20 MEQ tablet Take 20 mEq by mouth every other day.     . traZODone (DESYREL) 100 MG tablet take 1/2 tablet by mouth at bedtime **MAY INCREASE TO 1 TABLET AT BEDTIME IF NEEDED** 30 tablet 2  . Vitamin D, Ergocalciferol, (DRISDOL) 50000 UNITS CAPS Take 50,000 Units by mouth every 7 (seven) days.    Marland Kitchen. warfarin (COUMADIN) 5 MG tablet Take 5 mg by mouth as directed. Managed by [facility] office     No current  facility-administered medications for this visit.    Allergies:  Bactrim [sulfamethoxazole-trimethoprim]; Codeine; and Novocain [procaine hcl]   Social History: The patient  reports that she has never smoked. She has never used smokeless tobacco. She reports that she does not drink alcohol or use drugs.   ROS:  Please see the history of present illness. Otherwise, complete review of systems is positive for mild trouble with memory.  All other systems are reviewed and negative.   Physical Exam: VS:  BP (!) 156/69   Pulse (!) 53   Ht 5\' 2"  (1.575 m)   Wt 179 lb 9.6 oz (81.5 kg)   SpO2 97%   BMI 32.85 kg/m , BMI Body mass index is 32.85 kg/m.  Wt Readings from Last 3 Encounters:  10/17/15 179 lb 9.6 oz (81.5 kg)  08/08/15 182 lb 9.6 oz (82.8 kg)  05/08/15 186 lb 6.4 oz (84.6 kg)    Overweight woman, no distress. HEENT: Conjunctiva and lids normal, oropharynx clear.  Neck: Supple, no elevated JVP or carotid bruits, no thyromegaly.  Lungs: Clear to auscultation, nonlabored breathing at rest.  Cardiac: Regular with ectopy, no S3 or significant systolic murmur, no pericardial rub.  Abdomen: Soft, nontender, bowel sounds present.  Extremities: 1+ leg edema, distal pulses 2+.  ECG: I personally reviewed the tracing from 05/08/2015 which showed sinus bradycardia with prolonged PR interval and QRS  duration 86 ms, normal QTc.  Recent Labwork: 10/23/2014: ALT 13; AST 20; TSH 1.164   Other Studies Reviewed Today:  Echocardiogram 03/05/2015: Study Conclusions  - Left ventricle: The cavity size was normal. Wall thickness was normal. Systolic function was normal. The estimated ejection fraction was in the range of 55% to 60%. Wall motion was normal; there were no regional wall motion abnormalities. - Aortic valve: Mildly to moderately calcified annulus. Trileaflet; mildly thickened leaflets. There was mild to moderate regurgitation. Valve area (VTI): 2.9 cm^2. Valve area  (Vmax): 2.72 cm^2. Valve area (Vmean): 2.74 cm^2. - Mitral valve: Mildly calcified annulus. Mildly thickened leaflets . There was mild regurgitation. - Left atrium: The atrium was mildly dilated. - Right atrium: The atrium was mildly dilated. - Pulmonary arteries: Systolic pressure was mildly to moderately increased. PA peak pressure: 40 mm Hg (S). - Technically adequate study.  Lexiscan Cardiolite 10/23/2014: Atrial fibrillation throughout with no diagnostic ST segment changes, probable variable soft tissue attenuation affecting the mid anterolateral and apical anteroseptal walls, overall low risk, LVEF 64%.  Assessment and Plan:  1. Paroxysmal atrial fibrillation. Symptom medically stable at this time. Continue current regimen including flecainide, Toprol-XL, and Coumadin.  2. Multifactorial dyspnea with component of diastolic dysfunction. She has been doing better on higher dose Lasix. No changes made for now.  Current medicines were reviewed with the patient today.  Disposition: Follow-up with me in 3 months.  Signed, Jonelle SidleSamuel G. McDowell, MD, [facility] 10/17/2015 2:10 PM    [facility] [address], [place], [place] [address] Phone: [phone](336) [phone]; Fax: [phone]

## 2015-10-17 ENCOUNTER — Encounter: Payer: Self-pay | Admitting: Cardiology

## 2015-10-17 ENCOUNTER — Ambulatory Visit (INDEPENDENT_AMBULATORY_CARE_PROVIDER_SITE_OTHER): Payer: Medicare Other | Admitting: Cardiology

## 2015-10-17 VITALS — BP 156/69 | HR 53 | Ht 62.0 in | Wt 179.6 lb

## 2015-10-17 DIAGNOSIS — I480 Paroxysmal atrial fibrillation: Secondary | ICD-10-CM

## 2015-10-17 DIAGNOSIS — I5032 Chronic diastolic (congestive) heart failure: Secondary | ICD-10-CM

## 2015-10-17 NOTE — Patient Instructions (Signed)
Your physician recommends that you schedule a follow-up appointment in: 3 months with Dr. McDowell  Your physician recommends that you continue on your current medications as directed. Please refer to the Current Medication list given to you today.  Thank you for choosing [facility]!!    

## 2015-10-23 DIAGNOSIS — I4891 Unspecified atrial fibrillation: Secondary | ICD-10-CM | POA: Diagnosis not present

## 2015-10-23 DIAGNOSIS — Z1231 Encounter for screening mammogram for malignant neoplasm of breast: Secondary | ICD-10-CM | POA: Diagnosis not present

## 2015-11-20 DIAGNOSIS — Z713 Dietary counseling and surveillance: Secondary | ICD-10-CM | POA: Diagnosis not present

## 2015-11-20 DIAGNOSIS — G4700 Insomnia, unspecified: Secondary | ICD-10-CM | POA: Diagnosis not present

## 2015-11-20 DIAGNOSIS — Z6830 Body mass index (BMI) 30.0-30.9, adult: Secondary | ICD-10-CM | POA: Diagnosis not present

## 2015-11-20 DIAGNOSIS — I4891 Unspecified atrial fibrillation: Secondary | ICD-10-CM | POA: Diagnosis not present

## 2015-12-05 DIAGNOSIS — I10 Essential (primary) hypertension: Secondary | ICD-10-CM | POA: Diagnosis not present

## 2015-12-05 DIAGNOSIS — I509 Heart failure, unspecified: Secondary | ICD-10-CM | POA: Diagnosis not present

## 2015-12-05 DIAGNOSIS — E7800 Pure hypercholesterolemia, unspecified: Secondary | ICD-10-CM | POA: Diagnosis not present

## 2015-12-05 DIAGNOSIS — I4891 Unspecified atrial fibrillation: Secondary | ICD-10-CM | POA: Diagnosis not present

## 2015-12-11 DIAGNOSIS — H43391 Other vitreous opacities, right eye: Secondary | ICD-10-CM | POA: Diagnosis not present

## 2015-12-18 DIAGNOSIS — Z299 Encounter for prophylactic measures, unspecified: Secondary | ICD-10-CM | POA: Diagnosis not present

## 2015-12-18 DIAGNOSIS — F419 Anxiety disorder, unspecified: Secondary | ICD-10-CM | POA: Diagnosis not present

## 2015-12-18 DIAGNOSIS — Z713 Dietary counseling and surveillance: Secondary | ICD-10-CM | POA: Diagnosis not present

## 2015-12-18 DIAGNOSIS — Z6831 Body mass index (BMI) 31.0-31.9, adult: Secondary | ICD-10-CM | POA: Diagnosis not present

## 2015-12-18 DIAGNOSIS — I4891 Unspecified atrial fibrillation: Secondary | ICD-10-CM | POA: Diagnosis not present

## 2016-01-13 DIAGNOSIS — I509 Heart failure, unspecified: Secondary | ICD-10-CM | POA: Diagnosis not present

## 2016-01-13 DIAGNOSIS — E7800 Pure hypercholesterolemia, unspecified: Secondary | ICD-10-CM | POA: Diagnosis not present

## 2016-01-13 DIAGNOSIS — I4891 Unspecified atrial fibrillation: Secondary | ICD-10-CM | POA: Diagnosis not present

## 2016-01-13 DIAGNOSIS — I10 Essential (primary) hypertension: Secondary | ICD-10-CM | POA: Diagnosis not present

## 2016-01-15 DIAGNOSIS — Z6831 Body mass index (BMI) 31.0-31.9, adult: Secondary | ICD-10-CM | POA: Diagnosis not present

## 2016-01-15 DIAGNOSIS — Z299 Encounter for prophylactic measures, unspecified: Secondary | ICD-10-CM | POA: Diagnosis not present

## 2016-01-15 DIAGNOSIS — I4891 Unspecified atrial fibrillation: Secondary | ICD-10-CM | POA: Diagnosis not present

## 2016-01-15 DIAGNOSIS — Z713 Dietary counseling and surveillance: Secondary | ICD-10-CM | POA: Diagnosis not present

## 2016-02-08 NOTE — Progress Notes (Signed)
Cardiology Office Note  Date: 02/09/2016   ID: Danielle FallsJoy Y Biby, DOB [birth year], MRN [id number]  PCP: Ignatius Speckinghruv B Vyas, MD  Primary Cardiologist: Nona DellSamuel McDowell, MD   Chief Complaint  Patient presents with  . Atrial Fibrillation    History of Present Illness: Danielle Brooks is an 82 y.o. female last seen in August. She is here today with her husband. Reports no overall change in stamina. Still generally remains worried about her health. She has had trouble with bronchitis and asthma flares. No palpitations or chest pain.  She cut her Lasix back to 20 mg daily instead of increasing to 40 mg 3 days a week, stated that the higher dose made her feel tired. Her weight is down a pound. She does not have any worsening leg edema or orthopnea.   She is on Coumadin, followed by Dr. Sherril CroonVyas.  Past Medical History:  Diagnosis Date  . Atrial fibrillation (HCC)    Paroxysmal, history of normal LV and negative ischemic workup  . Mixed hyperlipidemia   . Osteoporosis   . TIA (transient ischemic attack)   . Vitamin D deficiency     Current Outpatient Prescriptions  Medication Sig Dispense Refill  . ALPRAZolam (XANAX) 0.25 MG tablet Take 1 tablet by mouth 2 (two) times daily as needed.  0  . clobetasol cream (TEMOVATE) 0.05 % Apply 1 application topically daily as needed.  0  . docusate sodium (COLACE) 100 MG capsule Take 100 mg by mouth as needed for mild constipation.    . flecainide (TAMBOCOR) 100 MG tablet Take 1 tablet (100 mg total) by mouth 2 (two) times daily. 180 tablet 3  . furosemide (LASIX) 20 MG tablet Take 2 tabs (40mg ) on Monday Wednesday Friday alternating with 20 mg 1 tab (Patient taking differently: Take 20 mg by mouth daily. Patient changed directions to one daily (Take 2 tabs (40mg ) on Monday Wednesday Friday alternating with 20 mg 1 tab)) 180 tablet 3  . metoprolol succinate (TOPROL-XL) 50 MG 24 hr tablet take 1 tablet by mouth twice a day --DOSE INCREASED 02/27/2015 60 tablet 6  .  potassium chloride (K-DUR) 10 MEQ tablet Take 1-2 tablets by mouth daily as needed. With lasix  0  . potassium chloride SA (K-DUR,KLOR-CON) 20 MEQ tablet Take 20 mEq by mouth every other day.     . traZODone (DESYREL) 100 MG tablet take 1/2 tablet by mouth at bedtime **MAY INCREASE TO 1 TABLET AT BEDTIME IF NEEDED** (Patient taking differently: Take 100 mg by mouth at bedtime. ) 30 tablet 2  . Vitamin D, Ergocalciferol, (DRISDOL) 50000 UNITS CAPS Take 50,000 Units by mouth every 7 (seven) days.    Marland Kitchen. warfarin (COUMADIN) 5 MG tablet Take 5 mg by mouth as directed. Managed by [facility] office     No current facility-administered medications for this visit.    Allergies:  Bactrim [sulfamethoxazole-trimethoprim]; Codeine; and Novocain [procaine hcl]   Social History: The patient  reports that she has never smoked. She has never used smokeless tobacco. She reports that she does not drink alcohol or use drugs.   ROS:  Please see the history of present illness. Otherwise, complete review of systems is positive for chronic shortness of breath.  All other systems are reviewed and negative.   Physical Exam: VS:  BP 138/84   Pulse 87   Ht 5\' 2"  (1.575 m)   Wt 178 lb (80.7 kg)   SpO2 93% Comment: on room air  BMI 32.56 kg/m ,  BMI Body mass index is 32.56 kg/m.  Wt Readings from Last 3 Encounters:  02/09/16 178 lb (80.7 kg)  10/17/15 179 lb 9.6 oz (81.5 kg)  08/08/15 182 lb 9.6 oz (82.8 kg)    Overweight woman, no distress. HEENT: Conjunctiva and lids normal, oropharynx clear.  Neck: Supple, no elevated JVP or carotid bruits, no thyromegaly.  Lungs: Clear to auscultation, nonlabored breathing at rest.  Cardiac: Regular with ectopy, no S3 or significant systolic murmur, no pericardial rub.  Abdomen: Soft, nontender, bowel sounds present.  Extremities: 1+ leg edema, distal pulses 2+.  ECG: I personally reviewed the tracing from 05/08/2015 which showed sinus bradycardia with prolonged PR interval  and QRS duration 86 ms, normal QTc.  Recent Labwork:  10/23/2014: ALT 13; AST 20; TSH 1.164   Other Studies Reviewed Today:  Echocardiogram 03/05/2015: Study Conclusions  - Left ventricle: The cavity size was normal. Wall thickness was normal. Systolic function was normal. The estimated ejection fraction was in the range of 55% to 60%. Wall motion was normal; there were no regional wall motion abnormalities. - Aortic valve: Mildly to moderately calcified annulus. Trileaflet; mildly thickened leaflets. There was mild to moderate regurgitation. Valve area (VTI): 2.9 cm^2. Valve area (Vmax): 2.72 cm^2. Valve area (Vmean): 2.74 cm^2. - Mitral valve: Mildly calcified annulus. Mildly thickened leaflets . There was mild regurgitation. - Left atrium: The atrium was mildly dilated. - Right atrium: The atrium was mildly dilated. - Pulmonary arteries: Systolic pressure was mildly to moderately increased. PA peak pressure: 40 mm Hg (S). - Technically adequate study.  Lexiscan Cardiolite 10/23/2014: Atrial fibrillation throughout with no diagnostic ST segment changes, probable variable soft tissue attenuation affecting the mid anterolateral and apical anteroseptal walls, overall low risk, LVEF 64%.  Assessment and Plan:  1. Paroxysmal to persistent atrial fibrillation. Plan to continue current medical regimen including Toprol-XL, flecainide, and Coumadin. She is symptom medically stable in terms of palpitations.  2. Diastolic dysfunction, planning to continue on Lasix 20 mg daily since she did not tolerate the higher dose very well. Weight is stable.  Current medicines were reviewed with the patient today.  Disposition: Follow-up in 4 months.  Signed, Jonelle SidleSamuel G. McDowell, MD, [facility] 02/09/2016 2:23 PM    [facility] [address], [place], [place] [address] Phone: [phone](336) [phone]; Fax: [phone]) [phone]

## 2016-02-09 ENCOUNTER — Encounter: Payer: Self-pay | Admitting: Cardiology

## 2016-02-09 ENCOUNTER — Ambulatory Visit (INDEPENDENT_AMBULATORY_CARE_PROVIDER_SITE_OTHER): Payer: Medicare Other | Admitting: Cardiology

## 2016-02-09 VITALS — BP 138/84 | HR 87 | Ht 62.0 in | Wt 178.0 lb

## 2016-02-09 DIAGNOSIS — I480 Paroxysmal atrial fibrillation: Secondary | ICD-10-CM

## 2016-02-09 DIAGNOSIS — I5032 Chronic diastolic (congestive) heart failure: Secondary | ICD-10-CM | POA: Diagnosis not present

## 2016-02-09 NOTE — Patient Instructions (Addendum)
Your physician wants you to follow-up in: 4 MONTHS WITH DR. MCDOWELL You will receive a reminder letter in the mail two months in advance. If you don't receive a letter, please call our office to schedule the follow-up appointment.  Your physician recommends that you continue on your current medications as directed. Please refer to the Current Medication list given to you today.  Thank you for choosing [facility]!!    

## 2016-02-19 DIAGNOSIS — Z299 Encounter for prophylactic measures, unspecified: Secondary | ICD-10-CM | POA: Diagnosis not present

## 2016-02-19 DIAGNOSIS — Z6831 Body mass index (BMI) 31.0-31.9, adult: Secondary | ICD-10-CM | POA: Diagnosis not present

## 2016-02-19 DIAGNOSIS — I4891 Unspecified atrial fibrillation: Secondary | ICD-10-CM | POA: Diagnosis not present

## 2016-02-19 DIAGNOSIS — I5030 Unspecified diastolic (congestive) heart failure: Secondary | ICD-10-CM | POA: Diagnosis not present

## 2016-02-19 DIAGNOSIS — F0390 Unspecified dementia without behavioral disturbance: Secondary | ICD-10-CM | POA: Diagnosis not present

## 2016-02-25 DIAGNOSIS — I10 Essential (primary) hypertension: Secondary | ICD-10-CM | POA: Diagnosis not present

## 2016-02-25 DIAGNOSIS — E7800 Pure hypercholesterolemia, unspecified: Secondary | ICD-10-CM | POA: Diagnosis not present

## 2016-02-25 DIAGNOSIS — I4891 Unspecified atrial fibrillation: Secondary | ICD-10-CM | POA: Diagnosis not present

## 2016-02-25 DIAGNOSIS — I509 Heart failure, unspecified: Secondary | ICD-10-CM | POA: Diagnosis not present

## 2016-04-16 DIAGNOSIS — I4891 Unspecified atrial fibrillation: Secondary | ICD-10-CM | POA: Diagnosis not present

## 2016-04-16 DIAGNOSIS — Z299 Encounter for prophylactic measures, unspecified: Secondary | ICD-10-CM | POA: Diagnosis not present

## 2016-04-16 DIAGNOSIS — Z713 Dietary counseling and surveillance: Secondary | ICD-10-CM | POA: Diagnosis not present

## 2016-04-16 DIAGNOSIS — Z6831 Body mass index (BMI) 31.0-31.9, adult: Secondary | ICD-10-CM | POA: Diagnosis not present

## 2016-04-19 DIAGNOSIS — I10 Essential (primary) hypertension: Secondary | ICD-10-CM | POA: Diagnosis not present

## 2016-04-19 DIAGNOSIS — E7800 Pure hypercholesterolemia, unspecified: Secondary | ICD-10-CM | POA: Diagnosis not present

## 2016-04-19 DIAGNOSIS — I4891 Unspecified atrial fibrillation: Secondary | ICD-10-CM | POA: Diagnosis not present

## 2016-04-19 DIAGNOSIS — I509 Heart failure, unspecified: Secondary | ICD-10-CM | POA: Diagnosis not present

## 2016-05-11 ENCOUNTER — Other Ambulatory Visit: Payer: Self-pay | Admitting: Cardiology

## 2016-05-14 DIAGNOSIS — Z713 Dietary counseling and surveillance: Secondary | ICD-10-CM | POA: Diagnosis not present

## 2016-05-14 DIAGNOSIS — I4891 Unspecified atrial fibrillation: Secondary | ICD-10-CM | POA: Diagnosis not present

## 2016-05-14 DIAGNOSIS — Z6831 Body mass index (BMI) 31.0-31.9, adult: Secondary | ICD-10-CM | POA: Diagnosis not present

## 2016-05-14 DIAGNOSIS — Z299 Encounter for prophylactic measures, unspecified: Secondary | ICD-10-CM | POA: Diagnosis not present

## 2016-05-24 DIAGNOSIS — Z713 Dietary counseling and surveillance: Secondary | ICD-10-CM | POA: Diagnosis not present

## 2016-05-24 DIAGNOSIS — Z299 Encounter for prophylactic measures, unspecified: Secondary | ICD-10-CM | POA: Diagnosis not present

## 2016-05-24 DIAGNOSIS — I4891 Unspecified atrial fibrillation: Secondary | ICD-10-CM | POA: Diagnosis not present

## 2016-05-24 DIAGNOSIS — Z6831 Body mass index (BMI) 31.0-31.9, adult: Secondary | ICD-10-CM | POA: Diagnosis not present

## 2016-05-24 DIAGNOSIS — L089 Local infection of the skin and subcutaneous tissue, unspecified: Secondary | ICD-10-CM | POA: Diagnosis not present

## 2016-05-24 DIAGNOSIS — I5030 Unspecified diastolic (congestive) heart failure: Secondary | ICD-10-CM | POA: Diagnosis not present

## 2016-05-24 DIAGNOSIS — L723 Sebaceous cyst: Secondary | ICD-10-CM | POA: Diagnosis not present

## 2016-05-24 DIAGNOSIS — I10 Essential (primary) hypertension: Secondary | ICD-10-CM | POA: Diagnosis not present

## 2016-05-27 DIAGNOSIS — E7800 Pure hypercholesterolemia, unspecified: Secondary | ICD-10-CM | POA: Diagnosis not present

## 2016-05-27 DIAGNOSIS — I10 Essential (primary) hypertension: Secondary | ICD-10-CM | POA: Diagnosis not present

## 2016-05-27 DIAGNOSIS — I509 Heart failure, unspecified: Secondary | ICD-10-CM | POA: Diagnosis not present

## 2016-05-27 DIAGNOSIS — I4891 Unspecified atrial fibrillation: Secondary | ICD-10-CM | POA: Diagnosis not present

## 2016-06-02 DIAGNOSIS — L723 Sebaceous cyst: Secondary | ICD-10-CM | POA: Diagnosis not present

## 2016-06-12 ENCOUNTER — Other Ambulatory Visit: Payer: Self-pay | Admitting: Cardiology

## 2016-06-15 DIAGNOSIS — Z886 Allergy status to analgesic agent status: Secondary | ICD-10-CM | POA: Diagnosis not present

## 2016-06-15 DIAGNOSIS — L720 Epidermal cyst: Secondary | ICD-10-CM | POA: Diagnosis not present

## 2016-06-15 DIAGNOSIS — L723 Sebaceous cyst: Secondary | ICD-10-CM | POA: Diagnosis not present

## 2016-06-15 DIAGNOSIS — Z882 Allergy status to sulfonamides status: Secondary | ICD-10-CM | POA: Diagnosis not present

## 2016-06-25 DIAGNOSIS — I509 Heart failure, unspecified: Secondary | ICD-10-CM | POA: Diagnosis not present

## 2016-06-25 DIAGNOSIS — I4891 Unspecified atrial fibrillation: Secondary | ICD-10-CM | POA: Diagnosis not present

## 2016-06-25 DIAGNOSIS — E7800 Pure hypercholesterolemia, unspecified: Secondary | ICD-10-CM | POA: Diagnosis not present

## 2016-06-25 DIAGNOSIS — I10 Essential (primary) hypertension: Secondary | ICD-10-CM | POA: Diagnosis not present

## 2016-06-28 DIAGNOSIS — J189 Pneumonia, unspecified organism: Secondary | ICD-10-CM | POA: Diagnosis not present

## 2016-06-28 DIAGNOSIS — I110 Hypertensive heart disease with heart failure: Secondary | ICD-10-CM | POA: Diagnosis not present

## 2016-06-28 DIAGNOSIS — Z7901 Long term (current) use of anticoagulants: Secondary | ICD-10-CM | POA: Diagnosis not present

## 2016-06-28 DIAGNOSIS — I5033 Acute on chronic diastolic (congestive) heart failure: Secondary | ICD-10-CM | POA: Diagnosis not present

## 2016-06-28 DIAGNOSIS — R531 Weakness: Secondary | ICD-10-CM | POA: Diagnosis not present

## 2016-06-28 DIAGNOSIS — Z888 Allergy status to other drugs, medicaments and biological substances status: Secondary | ICD-10-CM | POA: Diagnosis not present

## 2016-06-28 DIAGNOSIS — R278 Other lack of coordination: Secondary | ICD-10-CM | POA: Diagnosis not present

## 2016-06-28 DIAGNOSIS — R404 Transient alteration of awareness: Secondary | ICD-10-CM | POA: Diagnosis not present

## 2016-06-28 DIAGNOSIS — R2689 Other abnormalities of gait and mobility: Secondary | ICD-10-CM | POA: Diagnosis not present

## 2016-06-28 DIAGNOSIS — E7800 Pure hypercholesterolemia, unspecified: Secondary | ICD-10-CM | POA: Diagnosis present

## 2016-06-28 DIAGNOSIS — M810 Age-related osteoporosis without current pathological fracture: Secondary | ICD-10-CM | POA: Diagnosis present

## 2016-06-28 DIAGNOSIS — F419 Anxiety disorder, unspecified: Secondary | ICD-10-CM | POA: Diagnosis present

## 2016-06-28 DIAGNOSIS — I429 Cardiomyopathy, unspecified: Secondary | ICD-10-CM | POA: Diagnosis not present

## 2016-06-28 DIAGNOSIS — F329 Major depressive disorder, single episode, unspecified: Secondary | ICD-10-CM | POA: Diagnosis not present

## 2016-06-28 DIAGNOSIS — Z8249 Family history of ischemic heart disease and other diseases of the circulatory system: Secondary | ICD-10-CM | POA: Diagnosis not present

## 2016-06-28 DIAGNOSIS — Z881 Allergy status to other antibiotic agents status: Secondary | ICD-10-CM | POA: Diagnosis not present

## 2016-06-28 DIAGNOSIS — M6281 Muscle weakness (generalized): Secondary | ICD-10-CM | POA: Diagnosis not present

## 2016-06-28 DIAGNOSIS — I482 Chronic atrial fibrillation: Secondary | ICD-10-CM | POA: Diagnosis not present

## 2016-06-28 DIAGNOSIS — I4891 Unspecified atrial fibrillation: Secondary | ICD-10-CM | POA: Diagnosis not present

## 2016-06-28 DIAGNOSIS — Z79899 Other long term (current) drug therapy: Secondary | ICD-10-CM | POA: Diagnosis not present

## 2016-06-28 DIAGNOSIS — Z8673 Personal history of transient ischemic attack (TIA), and cerebral infarction without residual deficits: Secondary | ICD-10-CM | POA: Diagnosis not present

## 2016-07-01 DIAGNOSIS — F329 Major depressive disorder, single episode, unspecified: Secondary | ICD-10-CM | POA: Diagnosis not present

## 2016-07-01 DIAGNOSIS — J189 Pneumonia, unspecified organism: Secondary | ICD-10-CM | POA: Diagnosis not present

## 2016-07-01 DIAGNOSIS — Z79899 Other long term (current) drug therapy: Secondary | ICD-10-CM | POA: Diagnosis not present

## 2016-07-01 DIAGNOSIS — I509 Heart failure, unspecified: Secondary | ICD-10-CM | POA: Diagnosis not present

## 2016-07-01 DIAGNOSIS — W1839XA Other fall on same level, initial encounter: Secondary | ICD-10-CM | POA: Diagnosis not present

## 2016-07-01 DIAGNOSIS — I10 Essential (primary) hypertension: Secondary | ICD-10-CM | POA: Diagnosis not present

## 2016-07-01 DIAGNOSIS — M6281 Muscle weakness (generalized): Secondary | ICD-10-CM | POA: Diagnosis not present

## 2016-07-01 DIAGNOSIS — Z8679 Personal history of other diseases of the circulatory system: Secondary | ICD-10-CM | POA: Diagnosis not present

## 2016-07-01 DIAGNOSIS — I4891 Unspecified atrial fibrillation: Secondary | ICD-10-CM | POA: Diagnosis not present

## 2016-07-01 DIAGNOSIS — R0602 Shortness of breath: Secondary | ICD-10-CM | POA: Diagnosis not present

## 2016-07-01 DIAGNOSIS — Z7901 Long term (current) use of anticoagulants: Secondary | ICD-10-CM | POA: Diagnosis not present

## 2016-07-01 DIAGNOSIS — I110 Hypertensive heart disease with heart failure: Secondary | ICD-10-CM | POA: Diagnosis not present

## 2016-07-01 DIAGNOSIS — R079 Chest pain, unspecified: Secondary | ICD-10-CM | POA: Diagnosis not present

## 2016-07-01 DIAGNOSIS — I429 Cardiomyopathy, unspecified: Secondary | ICD-10-CM | POA: Diagnosis not present

## 2016-07-01 DIAGNOSIS — S299XXA Unspecified injury of thorax, initial encounter: Secondary | ICD-10-CM | POA: Diagnosis not present

## 2016-07-01 DIAGNOSIS — R278 Other lack of coordination: Secondary | ICD-10-CM | POA: Diagnosis not present

## 2016-07-01 DIAGNOSIS — I480 Paroxysmal atrial fibrillation: Secondary | ICD-10-CM | POA: Diagnosis not present

## 2016-07-01 DIAGNOSIS — F419 Anxiety disorder, unspecified: Secondary | ICD-10-CM | POA: Diagnosis not present

## 2016-07-01 DIAGNOSIS — S2190XA Unspecified open wound of unspecified part of thorax, initial encounter: Secondary | ICD-10-CM | POA: Diagnosis not present

## 2016-07-01 DIAGNOSIS — R2689 Other abnormalities of gait and mobility: Secondary | ICD-10-CM | POA: Diagnosis not present

## 2016-07-01 DIAGNOSIS — S20219A Contusion of unspecified front wall of thorax, initial encounter: Secondary | ICD-10-CM | POA: Diagnosis not present

## 2016-07-01 DIAGNOSIS — S0990XA Unspecified injury of head, initial encounter: Secondary | ICD-10-CM | POA: Diagnosis not present

## 2016-07-01 DIAGNOSIS — I2510 Atherosclerotic heart disease of native coronary artery without angina pectoris: Secondary | ICD-10-CM | POA: Diagnosis not present

## 2016-07-01 DIAGNOSIS — R0789 Other chest pain: Secondary | ICD-10-CM | POA: Diagnosis not present

## 2016-07-01 DIAGNOSIS — R531 Weakness: Secondary | ICD-10-CM | POA: Diagnosis not present

## 2016-07-01 DIAGNOSIS — R600 Localized edema: Secondary | ICD-10-CM | POA: Diagnosis not present

## 2016-07-01 DIAGNOSIS — S2020XA Contusion of thorax, unspecified, initial encounter: Secondary | ICD-10-CM | POA: Diagnosis not present

## 2016-07-01 DIAGNOSIS — W19XXXA Unspecified fall, initial encounter: Secondary | ICD-10-CM | POA: Diagnosis not present

## 2016-07-01 DIAGNOSIS — I5033 Acute on chronic diastolic (congestive) heart failure: Secondary | ICD-10-CM | POA: Diagnosis not present

## 2016-07-02 DIAGNOSIS — I5033 Acute on chronic diastolic (congestive) heart failure: Secondary | ICD-10-CM | POA: Diagnosis not present

## 2016-07-02 DIAGNOSIS — I10 Essential (primary) hypertension: Secondary | ICD-10-CM | POA: Diagnosis not present

## 2016-07-02 DIAGNOSIS — I480 Paroxysmal atrial fibrillation: Secondary | ICD-10-CM | POA: Diagnosis not present

## 2016-07-06 DIAGNOSIS — J189 Pneumonia, unspecified organism: Secondary | ICD-10-CM | POA: Diagnosis not present

## 2016-07-06 DIAGNOSIS — I509 Heart failure, unspecified: Secondary | ICD-10-CM | POA: Diagnosis not present

## 2016-07-06 DIAGNOSIS — I10 Essential (primary) hypertension: Secondary | ICD-10-CM | POA: Diagnosis not present

## 2016-07-16 DIAGNOSIS — I4891 Unspecified atrial fibrillation: Secondary | ICD-10-CM | POA: Diagnosis not present

## 2016-07-16 DIAGNOSIS — Z7901 Long term (current) use of anticoagulants: Secondary | ICD-10-CM | POA: Diagnosis not present

## 2016-07-16 DIAGNOSIS — W1839XA Other fall on same level, initial encounter: Secondary | ICD-10-CM | POA: Diagnosis not present

## 2016-07-16 DIAGNOSIS — S0990XA Unspecified injury of head, initial encounter: Secondary | ICD-10-CM | POA: Diagnosis not present

## 2016-07-16 DIAGNOSIS — S2020XA Contusion of thorax, unspecified, initial encounter: Secondary | ICD-10-CM | POA: Diagnosis not present

## 2016-07-16 DIAGNOSIS — Z79899 Other long term (current) drug therapy: Secondary | ICD-10-CM | POA: Diagnosis not present

## 2016-07-16 DIAGNOSIS — S299XXA Unspecified injury of thorax, initial encounter: Secondary | ICD-10-CM | POA: Diagnosis not present

## 2016-07-16 DIAGNOSIS — R0789 Other chest pain: Secondary | ICD-10-CM | POA: Diagnosis not present

## 2016-07-20 DIAGNOSIS — W19XXXA Unspecified fall, initial encounter: Secondary | ICD-10-CM | POA: Diagnosis not present

## 2016-07-20 DIAGNOSIS — F419 Anxiety disorder, unspecified: Secondary | ICD-10-CM | POA: Diagnosis not present

## 2016-07-20 DIAGNOSIS — S20219A Contusion of unspecified front wall of thorax, initial encounter: Secondary | ICD-10-CM | POA: Diagnosis not present

## 2016-07-20 DIAGNOSIS — S299XXA Unspecified injury of thorax, initial encounter: Secondary | ICD-10-CM | POA: Diagnosis not present

## 2016-07-30 DIAGNOSIS — R0602 Shortness of breath: Secondary | ICD-10-CM | POA: Diagnosis not present

## 2016-07-30 DIAGNOSIS — W19XXXA Unspecified fall, initial encounter: Secondary | ICD-10-CM | POA: Diagnosis not present

## 2016-07-30 DIAGNOSIS — Z8679 Personal history of other diseases of the circulatory system: Secondary | ICD-10-CM | POA: Diagnosis not present

## 2016-08-06 DIAGNOSIS — R0789 Other chest pain: Secondary | ICD-10-CM | POA: Diagnosis not present

## 2016-08-06 DIAGNOSIS — F329 Major depressive disorder, single episode, unspecified: Secondary | ICD-10-CM | POA: Diagnosis not present

## 2016-08-20 DIAGNOSIS — I2510 Atherosclerotic heart disease of native coronary artery without angina pectoris: Secondary | ICD-10-CM | POA: Diagnosis not present

## 2016-08-20 DIAGNOSIS — Z8679 Personal history of other diseases of the circulatory system: Secondary | ICD-10-CM | POA: Diagnosis not present

## 2016-08-20 DIAGNOSIS — R0789 Other chest pain: Secondary | ICD-10-CM | POA: Diagnosis not present

## 2016-08-20 DIAGNOSIS — F329 Major depressive disorder, single episode, unspecified: Secondary | ICD-10-CM | POA: Diagnosis not present

## 2016-09-07 DIAGNOSIS — I4891 Unspecified atrial fibrillation: Secondary | ICD-10-CM | POA: Diagnosis not present

## 2016-09-07 DIAGNOSIS — R0789 Other chest pain: Secondary | ICD-10-CM | POA: Diagnosis not present

## 2016-09-07 DIAGNOSIS — F419 Anxiety disorder, unspecified: Secondary | ICD-10-CM | POA: Diagnosis not present

## 2016-09-22 DIAGNOSIS — R600 Localized edema: Secondary | ICD-10-CM | POA: Diagnosis not present

## 2016-09-22 DIAGNOSIS — I480 Paroxysmal atrial fibrillation: Secondary | ICD-10-CM | POA: Diagnosis not present

## 2016-09-22 DIAGNOSIS — I509 Heart failure, unspecified: Secondary | ICD-10-CM | POA: Diagnosis not present

## 2016-09-23 DIAGNOSIS — Z7901 Long term (current) use of anticoagulants: Secondary | ICD-10-CM | POA: Diagnosis not present

## 2016-09-23 DIAGNOSIS — Z8673 Personal history of transient ischemic attack (TIA), and cerebral infarction without residual deficits: Secondary | ICD-10-CM | POA: Diagnosis not present

## 2016-09-23 DIAGNOSIS — M1712 Unilateral primary osteoarthritis, left knee: Secondary | ICD-10-CM | POA: Diagnosis not present

## 2016-09-23 DIAGNOSIS — Z8701 Personal history of pneumonia (recurrent): Secondary | ICD-10-CM | POA: Diagnosis not present

## 2016-09-23 DIAGNOSIS — M545 Low back pain: Secondary | ICD-10-CM | POA: Diagnosis not present

## 2016-09-23 DIAGNOSIS — F419 Anxiety disorder, unspecified: Secondary | ICD-10-CM | POA: Diagnosis not present

## 2016-09-23 DIAGNOSIS — M810 Age-related osteoporosis without current pathological fracture: Secondary | ICD-10-CM | POA: Diagnosis not present

## 2016-09-23 DIAGNOSIS — I110 Hypertensive heart disease with heart failure: Secondary | ICD-10-CM | POA: Diagnosis not present

## 2016-09-23 DIAGNOSIS — I5032 Chronic diastolic (congestive) heart failure: Secondary | ICD-10-CM | POA: Diagnosis not present

## 2016-09-23 DIAGNOSIS — Z9181 History of falling: Secondary | ICD-10-CM | POA: Diagnosis not present

## 2016-09-23 DIAGNOSIS — I4891 Unspecified atrial fibrillation: Secondary | ICD-10-CM | POA: Diagnosis not present

## 2016-09-23 DIAGNOSIS — F329 Major depressive disorder, single episode, unspecified: Secondary | ICD-10-CM | POA: Diagnosis not present

## 2016-09-27 DIAGNOSIS — M545 Low back pain: Secondary | ICD-10-CM | POA: Diagnosis not present

## 2016-09-27 DIAGNOSIS — I4891 Unspecified atrial fibrillation: Secondary | ICD-10-CM | POA: Diagnosis not present

## 2016-09-27 DIAGNOSIS — I110 Hypertensive heart disease with heart failure: Secondary | ICD-10-CM | POA: Diagnosis not present

## 2016-09-27 DIAGNOSIS — M810 Age-related osteoporosis without current pathological fracture: Secondary | ICD-10-CM | POA: Diagnosis not present

## 2016-09-27 DIAGNOSIS — I5032 Chronic diastolic (congestive) heart failure: Secondary | ICD-10-CM | POA: Diagnosis not present

## 2016-09-27 DIAGNOSIS — M1712 Unilateral primary osteoarthritis, left knee: Secondary | ICD-10-CM | POA: Diagnosis not present

## 2016-09-28 DIAGNOSIS — F419 Anxiety disorder, unspecified: Secondary | ICD-10-CM | POA: Diagnosis not present

## 2016-09-28 DIAGNOSIS — Z09 Encounter for follow-up examination after completed treatment for conditions other than malignant neoplasm: Secondary | ICD-10-CM | POA: Diagnosis not present

## 2016-09-28 DIAGNOSIS — Z299 Encounter for prophylactic measures, unspecified: Secondary | ICD-10-CM | POA: Diagnosis not present

## 2016-09-28 DIAGNOSIS — I5030 Unspecified diastolic (congestive) heart failure: Secondary | ICD-10-CM | POA: Diagnosis not present

## 2016-09-28 DIAGNOSIS — Z713 Dietary counseling and surveillance: Secondary | ICD-10-CM | POA: Diagnosis not present

## 2016-09-28 DIAGNOSIS — I10 Essential (primary) hypertension: Secondary | ICD-10-CM | POA: Diagnosis not present

## 2016-09-28 DIAGNOSIS — I4891 Unspecified atrial fibrillation: Secondary | ICD-10-CM | POA: Diagnosis not present

## 2016-09-28 DIAGNOSIS — E7800 Pure hypercholesterolemia, unspecified: Secondary | ICD-10-CM | POA: Diagnosis not present

## 2016-10-01 DIAGNOSIS — I110 Hypertensive heart disease with heart failure: Secondary | ICD-10-CM | POA: Diagnosis not present

## 2016-10-01 DIAGNOSIS — M545 Low back pain: Secondary | ICD-10-CM | POA: Diagnosis not present

## 2016-10-01 DIAGNOSIS — I4891 Unspecified atrial fibrillation: Secondary | ICD-10-CM | POA: Diagnosis not present

## 2016-10-01 DIAGNOSIS — M810 Age-related osteoporosis without current pathological fracture: Secondary | ICD-10-CM | POA: Diagnosis not present

## 2016-10-01 DIAGNOSIS — I5032 Chronic diastolic (congestive) heart failure: Secondary | ICD-10-CM | POA: Diagnosis not present

## 2016-10-01 DIAGNOSIS — M1712 Unilateral primary osteoarthritis, left knee: Secondary | ICD-10-CM | POA: Diagnosis not present

## 2016-10-05 DIAGNOSIS — I110 Hypertensive heart disease with heart failure: Secondary | ICD-10-CM | POA: Diagnosis not present

## 2016-10-05 DIAGNOSIS — M545 Low back pain: Secondary | ICD-10-CM | POA: Diagnosis not present

## 2016-10-05 DIAGNOSIS — M1712 Unilateral primary osteoarthritis, left knee: Secondary | ICD-10-CM | POA: Diagnosis not present

## 2016-10-05 DIAGNOSIS — I4891 Unspecified atrial fibrillation: Secondary | ICD-10-CM | POA: Diagnosis not present

## 2016-10-05 DIAGNOSIS — I5032 Chronic diastolic (congestive) heart failure: Secondary | ICD-10-CM | POA: Diagnosis not present

## 2016-10-05 DIAGNOSIS — M810 Age-related osteoporosis without current pathological fracture: Secondary | ICD-10-CM | POA: Diagnosis not present

## 2016-10-07 DIAGNOSIS — M545 Low back pain: Secondary | ICD-10-CM | POA: Diagnosis not present

## 2016-10-07 DIAGNOSIS — M1712 Unilateral primary osteoarthritis, left knee: Secondary | ICD-10-CM | POA: Diagnosis not present

## 2016-10-07 DIAGNOSIS — M810 Age-related osteoporosis without current pathological fracture: Secondary | ICD-10-CM | POA: Diagnosis not present

## 2016-10-07 DIAGNOSIS — I110 Hypertensive heart disease with heart failure: Secondary | ICD-10-CM | POA: Diagnosis not present

## 2016-10-07 DIAGNOSIS — I5032 Chronic diastolic (congestive) heart failure: Secondary | ICD-10-CM | POA: Diagnosis not present

## 2016-10-07 DIAGNOSIS — I4891 Unspecified atrial fibrillation: Secondary | ICD-10-CM | POA: Diagnosis not present

## 2016-10-08 DIAGNOSIS — I110 Hypertensive heart disease with heart failure: Secondary | ICD-10-CM | POA: Diagnosis not present

## 2016-10-08 DIAGNOSIS — I5032 Chronic diastolic (congestive) heart failure: Secondary | ICD-10-CM | POA: Diagnosis not present

## 2016-10-08 DIAGNOSIS — M810 Age-related osteoporosis without current pathological fracture: Secondary | ICD-10-CM | POA: Diagnosis not present

## 2016-10-08 DIAGNOSIS — I4891 Unspecified atrial fibrillation: Secondary | ICD-10-CM | POA: Diagnosis not present

## 2016-10-08 DIAGNOSIS — M1712 Unilateral primary osteoarthritis, left knee: Secondary | ICD-10-CM | POA: Diagnosis not present

## 2016-10-08 DIAGNOSIS — M545 Low back pain: Secondary | ICD-10-CM | POA: Diagnosis not present

## 2016-10-11 ENCOUNTER — Encounter: Payer: Self-pay | Admitting: *Deleted

## 2016-10-11 DIAGNOSIS — M1712 Unilateral primary osteoarthritis, left knee: Secondary | ICD-10-CM | POA: Diagnosis not present

## 2016-10-11 DIAGNOSIS — I5032 Chronic diastolic (congestive) heart failure: Secondary | ICD-10-CM | POA: Diagnosis not present

## 2016-10-11 DIAGNOSIS — M545 Low back pain: Secondary | ICD-10-CM | POA: Diagnosis not present

## 2016-10-11 DIAGNOSIS — I4891 Unspecified atrial fibrillation: Secondary | ICD-10-CM | POA: Diagnosis not present

## 2016-10-11 DIAGNOSIS — M810 Age-related osteoporosis without current pathological fracture: Secondary | ICD-10-CM | POA: Diagnosis not present

## 2016-10-11 DIAGNOSIS — I110 Hypertensive heart disease with heart failure: Secondary | ICD-10-CM | POA: Diagnosis not present

## 2016-10-11 NOTE — Progress Notes (Signed)
Cardiology Office Note  Date: 10/12/2016   ID: Danielle FallsJoy Y Henandez, DOB 1928/03/12, MRN [id number]  PCP: Ignatius SpeckingVyas, Dhruv B, MD  Primary Cardiologist: Nona DellSamuel Sherilee Smotherman, MD   Chief Complaint  Patient presents with  . PAF    History of Present Illness: Danielle Brooks is an 83 y.o. female last seen in December 2017. She is here with her husband today for a follow-up visit. She apparently had a fall a few months ago, [facility] and then transferred to rehabilitation. I am not certain about specific diagnoses made, but she did have a sternal fracture ultimately diagnosed by x-ray. She has been home a little over a week, still has home PT and seems to be improving strength-wise. She has also been taken off of Celexa which was causing her to be less functionally active. She reports shortness of breath, not specifically palpitations. On examination she is in atrial fibrillation today, duration uncertain.  I reviewed her medications are stable from a cardiac perspective. She continue on Toprol-XL, flecainide, Lasix, and Coumadin. Anticoagulation is followed by Dr. Sherril CroonVyas.  Her last echocardiogram was in 2017.  Past Medical History:  Diagnosis Date  . Atrial fibrillation (HCC)    Paroxysmal, history of normal LV and negative ischemic workup  . Mixed hyperlipidemia   . Osteoporosis   . TIA (transient ischemic attack)   . Vitamin D deficiency     History reviewed. No pertinent surgical history.  Current Outpatient Prescriptions  Medication Sig Dispense Refill  . flecainide (TAMBOCOR) 100 MG tablet Take 1 tablet (100 mg total) by mouth 2 (two) times daily. 65 tablet 3  . furosemide (LASIX) 20 MG tablet Take 2 tabs (40mg ) on Monday Wednesday Friday alternating with 20 mg 1 tab (Patient taking differently: Take 20 mg by mouth daily. Patient changed directions to one daily (Take 2 tabs (40mg ) on Monday Wednesday Friday alternating with 20 mg 1 tab)) 180 tablet 3  . metoprolol  succinate (TOPROL-XL) 50 MG 24 hr tablet take 1 tablet by mouth twice a day 60 tablet 0  . potassium chloride (K-DUR) 10 MEQ tablet Take 1-2 tablets by mouth daily as needed. With lasix  0  . traZODone (DESYREL) 100 MG tablet take 1/2 tablet by mouth at bedtime **MAY INCREASE TO 1 TABLET AT BEDTIME IF NEEDED** (Patient taking differently: Take 100 mg by mouth at bedtime. ) 30 tablet 2  . Vitamin D, Ergocalciferol, (DRISDOL) 50000 UNITS CAPS Take 50,000 Units by mouth every 7 (seven) days.    Marland Kitchen. warfarin (COUMADIN) 5 MG tablet Take 5 mg by mouth as directed. [facility]    . ALPRAZolam (XANAX) 0.25 MG tablet Take 1 tablet by mouth 2 (two) times daily as needed.  0   No current facility-administered medications for this visit.    Allergies:  Bactrim [sulfamethoxazole-trimethoprim]; Codeine; and Novocain [procaine hcl]   Social History: The patient  reports that she has never smoked. She has never used smokeless tobacco. She reports that she does not drink alcohol or use drugs.   ROS:  Please see the history of present illness. Otherwise, complete review of systems is positive for shortness of breath, fatigue.  All other systems are reviewed and negative.   Physical Exam: VS:  BP 122/88   Pulse 88   Ht 5\' 2"  (1.575 m)   Wt 180 lb (81.6 kg)   SpO2 94%   BMI 32.92 kg/m , BMI Body mass index is 32.92  kg/m.  Wt Readings from Last 3 Encounters:  10/12/16 180 lb (81.6 kg)  02/09/16 178 lb (80.7 kg)  10/17/15 179 lb 9.6 oz (81.5 kg)    Overweight woman, no distress.Sitting in a wheelchair. HEENT: Conjunctiva and lids normal, oropharynx clear.  Neck: Supple, no elevated JVP or carotid bruits, no thyromegaly.  Lungs: Clear to auscultation, nonlabored breathing at rest.  Cardiac: Irregularly irregular, no S3 or significant systolic murmur, no pericardial rub.  Abdomen: Soft, nontender, bowel sounds present.  Extremities: 1+ leg edema, distal pulses 2+. Skin: Warm and  dry. Musculoskeletal: No kyphosis. Neuropsychiatric: Alert and oriented 3, affect appropriate.  ECG: I personally reviewed the tracing from 05/08/2015 which showed sinus bradycardia with prolonged PR interval and QRS duration 86 ms, normal QTc.  Recent Labwork:  10/23/2014: ALT 13; AST 20; TSH 1.164  Other Studies Reviewed Today:  Echocardiogram 03/05/2015: Study Conclusions  - Left ventricle: The cavity size was normal. Wall thickness was normal. Systolic function was normal. The estimated ejection fraction was in the range of 55% to 60%. Wall motion was normal; there were no regional wall motion abnormalities. - Aortic valve: Mildly to moderately calcified annulus. Trileaflet; mildly thickened leaflets. There was mild to moderate regurgitation. Valve area (VTI): 2.9 cm^2. Valve area (Vmax): 2.72 cm^2. Valve area (Vmean): 2.74 cm^2. - Mitral valve: Mildly calcified annulus. Mildly thickened leaflets . There was mild regurgitation. - Left atrium: The atrium was mildly dilated. - Right atrium: The atrium was mildly dilated. - Pulmonary arteries: Systolic pressure was mildly to moderately increased. PA peak pressure: 40 mm Hg (S). - Technically adequate study.  Lexiscan Cardiolite 10/23/2014: Atrial fibrillation throughout with no diagnostic ST segment changes, probable variable soft tissue attenuation affecting the mid anterolateral and apical anteroseptal walls, overall low risk, LVEF 64%.  Assessment and Plan:  1. Atrial fibrillation, persistent and of uncertain duration. Likely contributing to her shortness of breath although heart rate is not overly fast on present regimen. Plan is to give her an extra dose of flecainide 100 mg to her standing regimen over the next few days, also follow-up with an echocardiogram to reassess cardiac structure and function. Will bring her back to the office and discuss whether we need to pursue a cardioversion if her rhythm does not  convert.  2. Diastolic dysfunction, continues on Lasix and reports good urine output. Her weight is up only a few pounds.  3. Patient reported fall and sternal fracture, reports improving pain and increasing stamina with physical therapy at home.  4.Mixed hyperlipidemia, managed by diet. Continues to follow with Dr. Sherril Croon.  Current medicines were reviewed with the patient today.   Orders Placed This Encounter  Procedures  . ECHOCARDIOGRAM COMPLETE    Disposition: Follow-up in a few weeks.  Signed, Jonelle Sidle, MD, [facility] 10/12/2016 1:49 PM    [facility] at [facility] [address] [place], [place], [place] [address] Phone: [phone]; Fax: [phone]

## 2016-10-12 ENCOUNTER — Ambulatory Visit (INDEPENDENT_AMBULATORY_CARE_PROVIDER_SITE_OTHER): Payer: Medicare Other | Admitting: Cardiology

## 2016-10-12 ENCOUNTER — Encounter: Payer: Self-pay | Admitting: Cardiology

## 2016-10-12 VITALS — BP 122/88 | HR 88 | Ht 62.0 in | Wt 180.0 lb

## 2016-10-12 DIAGNOSIS — I5032 Chronic diastolic (congestive) heart failure: Secondary | ICD-10-CM

## 2016-10-12 DIAGNOSIS — R0602 Shortness of breath: Secondary | ICD-10-CM | POA: Diagnosis not present

## 2016-10-12 DIAGNOSIS — E782 Mixed hyperlipidemia: Secondary | ICD-10-CM | POA: Diagnosis not present

## 2016-10-12 DIAGNOSIS — I4819 Other persistent atrial fibrillation: Secondary | ICD-10-CM

## 2016-10-12 DIAGNOSIS — I481 Persistent atrial fibrillation: Secondary | ICD-10-CM

## 2016-10-12 MED ORDER — FLECAINIDE ACETATE 100 MG PO TABS: 100.0000 mg | ORAL_TABLET | Freq: Two times a day (BID) | ORAL | 3 refills | Status: DC

## 2016-10-12 NOTE — Patient Instructions (Signed)
Medication Instructions:  Your physician recommends that you continue on your current medications as directed. Please refer to the Current Medication list given to you today.  Labwork: NONE  Testing/Procedures: Your physician has requested that you have an echocardiogram. Echocardiography is a painless test that uses sound waves to create images of your heart. It provides your doctor with information about the size and shape of your heart and how well your heart's chambers and valves are working. This procedure takes approximately one hour. There are no restrictions for this procedure.   Follow-Up: Your physician recommends that you schedule a follow-up appointment in: 3 WEEKS WITH DR. MCDOWELL  Any Other Special Instructions Will Be Listed Below (If Applicable).  If you need a refill on your cardiac medications before your next appointment, please call your pharmacy.

## 2016-10-13 DIAGNOSIS — M545 Low back pain: Secondary | ICD-10-CM | POA: Diagnosis not present

## 2016-10-13 DIAGNOSIS — I110 Hypertensive heart disease with heart failure: Secondary | ICD-10-CM | POA: Diagnosis not present

## 2016-10-13 DIAGNOSIS — I5032 Chronic diastolic (congestive) heart failure: Secondary | ICD-10-CM | POA: Diagnosis not present

## 2016-10-13 DIAGNOSIS — M1712 Unilateral primary osteoarthritis, left knee: Secondary | ICD-10-CM | POA: Diagnosis not present

## 2016-10-13 DIAGNOSIS — I4891 Unspecified atrial fibrillation: Secondary | ICD-10-CM | POA: Diagnosis not present

## 2016-10-13 DIAGNOSIS — M810 Age-related osteoporosis without current pathological fracture: Secondary | ICD-10-CM | POA: Diagnosis not present

## 2016-10-15 DIAGNOSIS — M1712 Unilateral primary osteoarthritis, left knee: Secondary | ICD-10-CM | POA: Diagnosis not present

## 2016-10-15 DIAGNOSIS — I110 Hypertensive heart disease with heart failure: Secondary | ICD-10-CM | POA: Diagnosis not present

## 2016-10-15 DIAGNOSIS — M810 Age-related osteoporosis without current pathological fracture: Secondary | ICD-10-CM | POA: Diagnosis not present

## 2016-10-15 DIAGNOSIS — I4891 Unspecified atrial fibrillation: Secondary | ICD-10-CM | POA: Diagnosis not present

## 2016-10-15 DIAGNOSIS — M545 Low back pain: Secondary | ICD-10-CM | POA: Diagnosis not present

## 2016-10-15 DIAGNOSIS — I5032 Chronic diastolic (congestive) heart failure: Secondary | ICD-10-CM | POA: Diagnosis not present

## 2016-10-18 DIAGNOSIS — M545 Low back pain: Secondary | ICD-10-CM | POA: Diagnosis not present

## 2016-10-18 DIAGNOSIS — I5032 Chronic diastolic (congestive) heart failure: Secondary | ICD-10-CM | POA: Diagnosis not present

## 2016-10-18 DIAGNOSIS — I4891 Unspecified atrial fibrillation: Secondary | ICD-10-CM | POA: Diagnosis not present

## 2016-10-18 DIAGNOSIS — M810 Age-related osteoporosis without current pathological fracture: Secondary | ICD-10-CM | POA: Diagnosis not present

## 2016-10-18 DIAGNOSIS — I110 Hypertensive heart disease with heart failure: Secondary | ICD-10-CM | POA: Diagnosis not present

## 2016-10-18 DIAGNOSIS — M1712 Unilateral primary osteoarthritis, left knee: Secondary | ICD-10-CM | POA: Diagnosis not present

## 2016-10-19 ENCOUNTER — Inpatient Hospital Stay (HOSPITAL_COMMUNITY)
Admission: EM | Admit: 2016-10-19 | Discharge: 2016-10-25 | DRG: 291 | Disposition: A | Discharge status: Home Health | Payer: Medicare Other | Attending: Internal Medicine | Admitting: Internal Medicine

## 2016-10-19 ENCOUNTER — Telehealth: Payer: Self-pay

## 2016-10-19 ENCOUNTER — Emergency Department (HOSPITAL_COMMUNITY): Payer: Medicare Other

## 2016-10-19 ENCOUNTER — Encounter (HOSPITAL_COMMUNITY): Payer: Self-pay | Admitting: Emergency Medicine

## 2016-10-19 DIAGNOSIS — I10 Essential (primary) hypertension: Secondary | ICD-10-CM | POA: Diagnosis not present

## 2016-10-19 DIAGNOSIS — E876 Hypokalemia: Secondary | ICD-10-CM | POA: Diagnosis present

## 2016-10-19 DIAGNOSIS — J90 Pleural effusion, not elsewhere classified: Secondary | ICD-10-CM

## 2016-10-19 DIAGNOSIS — Z6830 Body mass index (BMI) 30.0-30.9, adult: Secondary | ICD-10-CM | POA: Diagnosis not present

## 2016-10-19 DIAGNOSIS — I6523 Occlusion and stenosis of bilateral carotid arteries: Secondary | ICD-10-CM | POA: Diagnosis not present

## 2016-10-19 DIAGNOSIS — I5033 Acute on chronic diastolic (congestive) heart failure: Secondary | ICD-10-CM | POA: Diagnosis not present

## 2016-10-19 DIAGNOSIS — Z882 Allergy status to sulfonamides status: Secondary | ICD-10-CM | POA: Diagnosis not present

## 2016-10-19 DIAGNOSIS — Z79899 Other long term (current) drug therapy: Secondary | ICD-10-CM | POA: Diagnosis not present

## 2016-10-19 DIAGNOSIS — R600 Localized edema: Secondary | ICD-10-CM | POA: Diagnosis not present

## 2016-10-19 DIAGNOSIS — R0682 Tachypnea, not elsewhere classified: Secondary | ICD-10-CM | POA: Diagnosis not present

## 2016-10-19 DIAGNOSIS — R0602 Shortness of breath: Secondary | ICD-10-CM

## 2016-10-19 DIAGNOSIS — G9340 Encephalopathy, unspecified: Secondary | ICD-10-CM | POA: Diagnosis present

## 2016-10-19 DIAGNOSIS — I509 Heart failure, unspecified: Secondary | ICD-10-CM

## 2016-10-19 DIAGNOSIS — Z7901 Long term (current) use of anticoagulants: Secondary | ICD-10-CM

## 2016-10-19 DIAGNOSIS — R4182 Altered mental status, unspecified: Secondary | ICD-10-CM | POA: Diagnosis not present

## 2016-10-19 DIAGNOSIS — Z884 Allergy status to anesthetic agent status: Secondary | ICD-10-CM | POA: Diagnosis not present

## 2016-10-19 DIAGNOSIS — Z8673 Personal history of transient ischemic attack (TIA), and cerebral infarction without residual deficits: Secondary | ICD-10-CM | POA: Diagnosis not present

## 2016-10-19 DIAGNOSIS — H50111 Monocular exotropia, right eye: Secondary | ICD-10-CM | POA: Diagnosis present

## 2016-10-19 DIAGNOSIS — I272 Pulmonary hypertension, unspecified: Secondary | ICD-10-CM | POA: Diagnosis present

## 2016-10-19 DIAGNOSIS — I639 Cerebral infarction, unspecified: Secondary | ICD-10-CM | POA: Diagnosis not present

## 2016-10-19 DIAGNOSIS — Z885 Allergy status to narcotic agent status: Secondary | ICD-10-CM

## 2016-10-19 DIAGNOSIS — R7989 Other specified abnormal findings of blood chemistry: Secondary | ICD-10-CM | POA: Diagnosis present

## 2016-10-19 DIAGNOSIS — J9601 Acute respiratory failure with hypoxia: Secondary | ICD-10-CM | POA: Diagnosis not present

## 2016-10-19 DIAGNOSIS — J80 Acute respiratory distress syndrome: Secondary | ICD-10-CM | POA: Diagnosis not present

## 2016-10-19 DIAGNOSIS — J449 Chronic obstructive pulmonary disease, unspecified: Secondary | ICD-10-CM | POA: Diagnosis present

## 2016-10-19 DIAGNOSIS — I110 Hypertensive heart disease with heart failure: Principal | ICD-10-CM | POA: Diagnosis present

## 2016-10-19 DIAGNOSIS — E669 Obesity, unspecified: Secondary | ICD-10-CM | POA: Diagnosis present

## 2016-10-19 DIAGNOSIS — I481 Persistent atrial fibrillation: Secondary | ICD-10-CM | POA: Diagnosis not present

## 2016-10-19 DIAGNOSIS — R4020 Unspecified coma: Secondary | ICD-10-CM | POA: Diagnosis not present

## 2016-10-19 DIAGNOSIS — R443 Hallucinations, unspecified: Secondary | ICD-10-CM | POA: Diagnosis not present

## 2016-10-19 DIAGNOSIS — I361 Nonrheumatic tricuspid (valve) insufficiency: Secondary | ICD-10-CM | POA: Diagnosis not present

## 2016-10-19 DIAGNOSIS — M810 Age-related osteoporosis without current pathological fracture: Secondary | ICD-10-CM | POA: Diagnosis present

## 2016-10-19 DIAGNOSIS — R0603 Acute respiratory distress: Secondary | ICD-10-CM

## 2016-10-19 DIAGNOSIS — R410 Disorientation, unspecified: Secondary | ICD-10-CM | POA: Diagnosis not present

## 2016-10-19 DIAGNOSIS — I480 Paroxysmal atrial fibrillation: Secondary | ICD-10-CM | POA: Diagnosis not present

## 2016-10-19 DIAGNOSIS — J9600 Acute respiratory failure, unspecified whether with hypoxia or hypercapnia: Secondary | ICD-10-CM | POA: Diagnosis present

## 2016-10-19 LAB — COMPREHENSIVE METABOLIC PANEL
ALT: 90 U/L — ABNORMAL HIGH (ref 14–54)
AST: 46 U/L — ABNORMAL HIGH (ref 15–41)
Albumin: 3.4 g/dL — ABNORMAL LOW (ref 3.5–5.0)
Alkaline Phosphatase: 72 U/L (ref 38–126)
Anion gap: 10 (ref 5–15)
BUN: 19 mg/dL (ref 6–20)
CO2: 28 mmol/L (ref 22–32)
Calcium: 8.8 mg/dL — ABNORMAL LOW (ref 8.9–10.3)
Chloride: 101 mmol/L (ref 101–111)
Creatinine, Ser: 0.87 mg/dL (ref 0.44–1.00)
GFR calc Af Amer: >60 mL/min (ref >60)
GFR calc non Af Amer: 59 mL/min — ABNORMAL LOW (ref >60)
Glucose, Bld: 103 mg/dL — ABNORMAL HIGH (ref 65–99)
Potassium: 4 mmol/L (ref 3.5–5.1)
Sodium: 139 mmol/L (ref 135–145)
Total Bilirubin: 0.5 mg/dL (ref 0.3–1.2)
Total Protein: 6.8 g/dL (ref 6.5–8.1)

## 2016-10-19 LAB — CBC WITH DIFFERENTIAL/PLATELET
Basophils Absolute: 0 10*3/uL (ref 0.0–0.1)
Basophils Relative: 0 %
Eosinophils Absolute: 0.2 10*3/uL (ref 0.0–0.7)
Eosinophils Relative: 2 %
HCT: 38.6 % (ref 36.0–46.0)
Hemoglobin: 12.4 g/dL (ref 12.0–15.0)
Lymphocytes Relative: 16 %
Lymphs Abs: 1.1 10*3/uL (ref 0.7–4.0)
MCH: 28.6 pg (ref 26.0–34.0)
MCHC: 32.1 g/dL (ref 30.0–36.0)
MCV: 88.9 fL (ref 78.0–100.0)
Monocytes Absolute: 0.9 10*3/uL (ref 0.1–1.0)
Monocytes Relative: 13 %
Neutro Abs: 4.8 10*3/uL (ref 1.7–7.7)
Neutrophils Relative %: 69 %
Platelets: 317 10*3/uL (ref 150–400)
RBC: 4.34 MIL/uL (ref 3.87–5.11)
RDW: 14.5 % (ref 11.5–15.5)
WBC: 7 10*3/uL (ref 4.0–10.5)

## 2016-10-19 LAB — TSH: TSH: 2.014 u[IU]/mL (ref 0.350–4.500)

## 2016-10-19 LAB — BRAIN NATRIURETIC PEPTIDE: B Natriuretic Peptide: 1395 pg/mL — ABNORMAL HIGH (ref 0.0–100.0)

## 2016-10-19 LAB — TROPONIN I: Troponin I: <0.03 ng/mL (ref <0.03)

## 2016-10-19 MED ORDER — ACETAMINOPHEN 650 MG RE SUPP: 650.0000 mg | Freq: Four times a day (QID) | RECTAL | Status: DC | PRN

## 2016-10-19 MED ORDER — FUROSEMIDE 10 MG/ML IJ SOLN
60.0000 mg | Freq: Once | INTRAMUSCULAR | Status: AC
  Administered 2016-10-19: 60 mg via INTRAVENOUS
  Filled 2016-10-19: qty 6

## 2016-10-19 MED ORDER — ALBUTEROL SULFATE (2.5 MG/3ML) 0.083% IN NEBU
5.0000 mg | INHALATION_SOLUTION | Freq: Once | RESPIRATORY_TRACT | Status: AC
  Administered 2016-10-19: 5 mg via RESPIRATORY_TRACT
  Filled 2016-10-19: qty 6

## 2016-10-19 MED ORDER — FUROSEMIDE 10 MG/ML IJ SOLN
40.0000 mg | Freq: Two times a day (BID) | INTRAMUSCULAR | Status: DC
  Administered 2016-10-20 – 2016-10-21 (×3): 40 mg via INTRAVENOUS
  Filled 2016-10-19 (×3): qty 4

## 2016-10-19 MED ORDER — TRAZODONE HCL 50 MG PO TABS
100.0000 mg | ORAL_TABLET | Freq: Every day | ORAL | Status: DC
  Administered 2016-10-20 – 2016-10-24 (×6): 100 mg via ORAL
  Filled 2016-10-19 (×6): qty 2

## 2016-10-19 MED ORDER — ALBUTEROL SULFATE (2.5 MG/3ML) 0.083% IN NEBU
2.5000 mg | INHALATION_SOLUTION | RESPIRATORY_TRACT | Status: DC | PRN
  Administered 2016-10-21: 2.5 mg via RESPIRATORY_TRACT
  Filled 2016-10-19: qty 3

## 2016-10-19 MED ORDER — ONDANSETRON HCL 4 MG PO TABS: 4.0000 mg | ORAL_TABLET | Freq: Four times a day (QID) | ORAL | Status: DC | PRN

## 2016-10-19 MED ORDER — METOPROLOL SUCCINATE ER 50 MG PO TB24
50.0000 mg | ORAL_TABLET | Freq: Two times a day (BID) | ORAL | Status: DC
  Administered 2016-10-20 – 2016-10-24 (×10): 50 mg via ORAL
  Filled 2016-10-19 (×13): qty 1

## 2016-10-19 MED ORDER — ACETAMINOPHEN 325 MG PO TABS
650.0000 mg | ORAL_TABLET | Freq: Four times a day (QID) | ORAL | Status: DC | PRN
  Administered 2016-10-22: 650 mg via ORAL
  Filled 2016-10-19: qty 2

## 2016-10-19 MED ORDER — ALBUTEROL SULFATE (2.5 MG/3ML) 0.083% IN NEBU
2.5000 mg | INHALATION_SOLUTION | Freq: Four times a day (QID) | RESPIRATORY_TRACT | Status: DC
  Administered 2016-10-20 (×4): 2.5 mg via RESPIRATORY_TRACT
  Filled 2016-10-19 (×4): qty 3

## 2016-10-19 MED ORDER — POTASSIUM CHLORIDE CRYS ER 10 MEQ PO TBCR
20.0000 meq | EXTENDED_RELEASE_TABLET | ORAL | Status: DC
  Administered 2016-10-20 – 2016-10-22 (×2): 20 meq via ORAL
  Filled 2016-10-19 (×5): qty 2

## 2016-10-19 MED ORDER — FLECAINIDE ACETATE 100 MG PO TABS
100.0000 mg | ORAL_TABLET | Freq: Two times a day (BID) | ORAL | Status: DC
  Administered 2016-10-20 – 2016-10-25 (×12): 100 mg via ORAL
  Filled 2016-10-19 (×17): qty 1

## 2016-10-19 MED ORDER — ONDANSETRON HCL 4 MG/2ML IJ SOLN: 4.0000 mg | Freq: Four times a day (QID) | INTRAMUSCULAR | Status: DC | PRN

## 2016-10-19 NOTE — ED Triage Notes (Signed)
Pt reports shortness of breath since Friday gradually increasing.  Home health notes report sats in 58s since Friday.  Pt denies pain and arrived to ED on CPAP.

## 2016-10-19 NOTE — Telephone Encounter (Signed)
Both Mr. and Danielle Brooks are patients of mine. I saw Danielle Brooks in the office today for his routine visit and he mentioned to me concerns about his wife Danielle Brooks, specifically stating that she has been declining in the last few days, more short of breath, weak, also confused. In light of this and the reported hypoxia per home health and PT assessment, I would recommend that she be hospitalized for further evaluation. He did not want to take her to [facility], [facility] hospital would be closest for him. I expect she can be admitted to the hospitalist team there and have cardiology consultation assist with medicine adjustments. She is pending a follow-up echocardiogram as mentioned in my recent office note.

## 2016-10-19 NOTE — H&P (Signed)
History and Physical    Danielle Brooks UJW:[id number] DOB: 10-Jul-1928 DOA: 10/19/2016  PCP: Ignatius Specking, MD  Patient coming from: Home.    Chief Complaint:  SOB, sent in by her primary cardiologist Dr Diona Browner.   HPI: Danielle Brooks is an 83 y.o. female with hx of afib on Coumadin, known diastolic CHF, OA, hx of TIA, saw Dr Diona Browner in the office, having SOB, bilateral pedal edema, but no chest pain.  He felt she needs to be admitted for further adjustment of her cardiac medication with inpatient cardiology consultation.  Evaluation in the ER showed larger pleural effusion and vascular congestion, but negative troponin, normal renal fx test, and normal liver Fx.  She has no leukocytosis with normal Hb.  Originally, she was given Bipap, as she was hypoxic with Sat in the high 80's, but improved. She was given a nebTx and Bipap.  She felt well now, but with exertion, her Sat dropped to the low 80's, corrected with supplement nasal cannula.  Hospitalist was asked to admit her for acute on chronic diastolic CHF, if not combined, and mild COPD.   ED Course:  See above.  Rewiew of Systems:  Constitutional: Negative for malaise, fever and chills. No significant weight loss or weight gain Eyes: Negative for eye pain, redness and discharge, diplopia, visual changes, or flashes of light. ENMT: Negative for ear pain, hoarseness, nasal congestion, sinus pressure and sore throat. No headaches; tinnitus, drooling, or problem swallowing. Cardiovascular: Negative for chest pain, palpitations, diaphoresis, Respiratory: Negative for cough, hemoptysis, wheezing and stridor. No pleuritic chestpain. Gastrointestinal: Negative for diarrhea, constipation,  melena, blood in stool, hematemesis, jaundice and rectal bleeding.    Genitourinary: Negative for frequency, dysuria, incontinence,flank pain and hematuria; Musculoskeletal: Negative for back pain and neck pain. Negative for swelling and trauma.;  Skin: . Negative for  pruritus, rash, abrasions, bruising and skin lesion.; ulcerations Neuro: Negative for headache, lightheadedness and neck stiffness. Negative for weakness, altered level of consciousness , altered mental status, extremity weakness, burning feet, involuntary movement, seizure and syncope.  Psych: negative for anxiety, depression, insomnia, tearfulness, panic attacks, hallucinations, paranoia, suicidal or homicidal ideation    Past Medical History:  Diagnosis Date  . Atrial fibrillation (HCC)    Paroxysmal, history of normal LV and negative ischemic workup  . Mixed hyperlipidemia   . Osteoporosis   . TIA (transient ischemic attack)   . Vitamin D deficiency    History reviewed. No pertinent surgical history.   reports that she has never smoked. She has never used smokeless tobacco. She reports that she does not drink alcohol or use drugs.  Allergies  Allergen Reactions  . Bactrim [Sulfamethoxazole-Trimethoprim] Rash  . Codeine Itching  . Novocain [Procaine Hcl] Other (See Comments)    Sob and passed out years ago at dentist office    Family History  Problem Relation Age of Onset  . Coronary artery disease Father   . Alzheimer's disease Mother   . Hypertension Sister      Prior to Admission medications   Medication Sig Start Date End Date Taking? Authorizing Provider  flecainide (TAMBOCOR) 100 MG tablet Take 1 tablet (100 mg total) by mouth 2 (two) times daily. 10/12/16  Yes Jonelle Sidle, MD  furosemide (LASIX) 20 MG tablet Take 2 tabs (40mg ) on Monday Wednesday Friday alternating with 20 mg 1 tab Patient taking differently: Take 20 mg by mouth daily. Patient changed directions to one daily (Take 2 tabs (40mg ) on Monday Wednesday  Friday alternating with 20 mg 1 tab) 08/08/15  Yes Jonelle Sidle, MD  metoprolol succinate (TOPROL-XL) 50 MG 24 hr tablet take 1 tablet by mouth twice a day 06/14/16  Yes Jonelle Sidle, MD  potassium chloride (K-DUR) 10 MEQ tablet Take 10-20  mEq by mouth daily. With lasix:Patient changed directions to one daily (Take 2 tabs (40mg ) on Monday Wednesday Friday alternating with 20 mg 1 tab) 01/15/16  Yes [provider]  traZODone (DESYREL) 100 MG tablet take 1/2 tablet by mouth at bedtime **MAY INCREASE TO 1 TABLET AT BEDTIME IF NEEDED** Patient taking differently: Take 100 mg by mouth at bedtime.  01/18/12  Yes Jonelle Sidle, MD  Vitamin D, Ergocalciferol, (DRISDOL) 50000 UNITS CAPS Take 50,000 Units by mouth every Tuesday.    Yes [provider]  warfarin (COUMADIN) 5 MG tablet Take 5 mg by mouth every evening. Managed by [facility] office   Yes [provider]    Physical Exam: Vitals:   10/19/16 1930 10/19/16 1957 10/19/16 2000 10/19/16 2056  BP: (!) 149/69 (!) 149/69 (!) 158/69 95/61  Pulse: (!) 57 62 60 82  Resp:  (!) 21 18 (!) 25  TempSrc:      SpO2: 100% 100% 100% 92%  Weight:      Height:        Constitutional: NAD, calm, comfortable Vitals:   10/19/16 1930 10/19/16 1957 10/19/16 2000 10/19/16 2056  BP: (!) 149/69 (!) 149/69 (!) 158/69 95/61  Pulse: (!) 57 62 60 82  Resp:  (!) 21 18 (!) 25  TempSrc:      SpO2: 100% 100% 100% 92%  Weight:      Height:       Eyes: PERRL, lids and conjunctivae normal ENMT: Mucous membranes are moist. Posterior pharynx clear of any exudate or lesions.Normal dentition.  Neck: normal, supple, no masses, no thyromegaly Respiratory: clear to auscultation bilaterally, no wheezing, no crackles. Normal respiratory effort. No accessory muscle use.  Cardiovascular: Regular rate and rhythm, no murmurs / rubs / gallops. No extremity edema. 2+ pedal pulses. No carotid bruits.  Abdomen: no tenderness, no masses palpated. No hepatosplenomegaly. Bowel sounds positive.  Musculoskeletal: no clubbing / cyanosis. No joint deformity upper and lower extremities. Good ROM, no contractures. Normal muscle tone.  Skin: no rashes, lesions, ulcers. No induration Neurologic: CN 2-12  grossly intact. Sensation intact, DTR normal. Strength 5/5 in all 4.  Psychiatric: Normal judgment and insight. Alert and oriented x 3. Normal mood.   Labs on Admission: I have personally reviewed following labs and imaging studies  CBC:  Recent Labs Lab 10/19/16 1833  WBC 7.0  NEUTROABS 4.8  HGB 12.4  HCT 38.6  MCV 88.9  PLT 317   Basic Metabolic Panel:  Recent Labs Lab 10/19/16 1833  NA 139  K 4.0  CL 101  CO2 28  GLUCOSE 103*  BUN 19  CREATININE 0.87  CALCIUM 8.8*   GFR: Estimated Creatinine Clearance: 45.9 mL/min (by C-G formula based on SCr of 0.87 mg/dL). Liver Function Tests:  Recent Labs Lab 10/19/16 1833  AST 46*  ALT 90*  ALKPHOS 72  BILITOT 0.5  PROT 6.8  ALBUMIN 3.4*    Recent Labs Lab 10/19/16 1833  TROPONINI <0.03   Radiological Exams on Admission: Dg Chest 2 View  Result Date: 10/19/2016 CLINICAL DATA:  Shortness of breath EXAM: CHEST  2 VIEW COMPARISON:  Jul 16, 2016 FINDINGS: There is a moderate right pleural effusion which is larger  in the interval with underlying opacity. Mild pulmonary venous congestion without overt edema. There is a compression fracture in a lower thoracic vertebral body which is new when compared to May of 2018. No other acute abnormalities. IMPRESSION: 1. There is a compression fracture of a lower thoracic vertebral body which is new compared to May 2018. Recommend clinical correlation for pain in this region. MRI could further assess acuity if clinically warranted. 2. The moderate right pleural effusion is larger in the interval. Mild pulmonary venous congestion. Electronically Signed   By: Gerome Sam III M.D   On: 10/19/2016 19:35    EKG: Independently reviewed. SB, with no acute ST T changes.    Assessment/Plan Principal Problem:   CHF, acute on chronic (HCC) Active Problems:   Paroxysmal atrial fibrillation (HCC)   Essential hypertension   PLAN:   CHF, acute on chronic, suspect diastolic, if not  combined.  Will repeat ECHO.  Give supplemental oxygen.  Check I and O and daily weight.  Consult cardiology as planned by Dr Diona Browner.  Will give BID lasix, and be cognizant of her GFR daily.   PAF:  Rate is controlled.  Continue with meds, including Coumadin per pharmacy's dosing.  COPD:  Her SOB is due partly to her mild bronchospasm as well.  She has some improvement with neb.  Will continue.  She has not required oxygen at home.  This is new requirement for her.  HTN:  BP is controlled.  Continue with her meds.    DVT prophylaxis: Coumadin.  Code Status: FULL CODE.  Family Communication: husband of 48 years at bedside.  Disposition Plan: Home.  Consults called: None.  Admission status: inpatient.    Javontae Marlette MD FACP. [facility]  If 7PM-7AM, please contact night-coverage [email or web address] Password [email or web address]  10/19/2016, 9:18 PM

## 2016-10-19 NOTE — ED Notes (Signed)
Ambulated pt to BR with O2-pt desat to 79% and reports feeling SOB, oxygen tank obtained, placed on 4L to ambulate back to room

## 2016-10-19 NOTE — ED Notes (Signed)
Assisted pt to bedside commode got 275 ot pt

## 2016-10-19 NOTE — ED Notes (Signed)
Pt states her breathing is easier since nebulizer.  Wheezing improved, but still present.

## 2016-10-19 NOTE — Telephone Encounter (Signed)
Dannielle Huh (Physical therapist) called stating patients heart rates have been between 50-52 since Friday. O2 levels have been running from 84-87% with minimal exertion. Patient is constantly short of breath with minimal exertion. Per physical therapist patient and husband were advised to go to the ER on Friday. Husband took patient but they did not stay to see a doctor. [name] spoke with husband today and advised him to have therapist call office. Will send to [name] to further advise.

## 2016-10-19 NOTE — ED Provider Notes (Signed)
[facility]-EMERGENCY DEPT Provider Note   CSN: [id number] Arrival date & time: 10/19/16  1807     History   Chief Complaint Chief Complaint  Patient presents with  . Shortness of Breath    HPI Danielle Brooks is a 83 y.o. female.  HPI  Patient presents in respiratory distress via EMS. Initially the history is provided by EMS providers, but after the patient has improvement she corroborates the details per She notes that she was released from rehabilitation after a fall, about 3 weeks ago. She has been doing not well at home, but in particular, over the past 3 or 4 days has had increasing dyspnea, weight gain, increasing lower extremity swelling. She has been taking her Lasix and all medication as directed including Coumadin. She denies focal pain, endorses generalized weakness, lightheadedness.  EMS notes that on arrival the patient was hypoxic on room air with a saturation of 84%. With increased work of breathing, tachypnea, the patient was started on continuous pressure support, and had improvement in route.   Past Medical History:  Diagnosis Date  . Atrial fibrillation (HCC)    Paroxysmal, history of normal LV and negative ischemic workup  . Mixed hyperlipidemia   . Osteoporosis   . TIA (transient ischemic attack)   . Vitamin D deficiency     Patient Active Problem List   Diagnosis Date Noted  . Paroxysmal atrial fibrillation (HCC) 07/29/2011  . Essential hypertension 07/29/2011  . Hx-TIA (transient ischemic attack) 07/29/2011    History reviewed. No pertinent surgical history.  OB History    No data available       Home Medications    Prior to Admission medications   Medication Sig Start Date End Date Taking? Authorizing Provider  ALPRAZolam Prudy Feeler) 0.25 MG tablet Take 1 tablet by mouth 2 (two) times daily as needed. 12/20/15   [provider]  flecainide (TAMBOCOR) 100 MG tablet Take 1 tablet (100 mg total) by mouth 2 (two) times daily. 10/12/16    Jonelle Sidle, MD  furosemide (LASIX) 20 MG tablet Take 2 tabs (40mg ) on Monday Wednesday Friday alternating with 20 mg 1 tab Patient taking differently: Take 20 mg by mouth daily. Patient changed directions to one daily (Take 2 tabs (40mg ) on Monday Wednesday Friday alternating with 20 mg 1 tab) 08/08/15   Jonelle Sidle, MD  metoprolol succinate (TOPROL-XL) 50 MG 24 hr tablet take 1 tablet by mouth twice a day 06/14/16   Jonelle Sidle, MD  potassium chloride (K-DUR) 10 MEQ tablet Take 1-2 tablets by mouth daily as needed. With lasix 01/15/16   [provider]  traZODone (DESYREL) 100 MG tablet take 1/2 tablet by mouth at bedtime **MAY INCREASE TO 1 TABLET AT BEDTIME IF NEEDED** Patient taking differently: Take 100 mg by mouth at bedtime.  01/18/12   Jonelle Sidle, MD  Vitamin D, Ergocalciferol, (DRISDOL) 50000 UNITS CAPS Take 50,000 Units by mouth every 7 (seven) days.    [provider]  warfarin (COUMADIN) 5 MG tablet Take 5 mg by mouth as directed. Managed by [facility] office    [provider]    Family History Family History  Problem Relation Age of Onset  . Coronary artery disease Father   . Alzheimer's disease Mother   . Hypertension Sister     Social History Social History  Substance Use Topics  . Smoking status: Never Smoker  . Smokeless tobacco: Never Used  . Alcohol use No  Allergies   Bactrim [sulfamethoxazole-trimethoprim]; Codeine; and Novocain [procaine hcl]   Review of Systems Review of Systems  Constitutional:       Per HPI, otherwise negative  HENT:       Per HPI, otherwise negative  Respiratory:       Per HPI, otherwise negative  Cardiovascular:       Per HPI, otherwise negative  Gastrointestinal: Negative for vomiting.  Endocrine:       Negative aside from HPI  Genitourinary:       Neg aside from HPI   Musculoskeletal:       Per HPI, otherwise negative  Skin: Negative.   Neurological: Negative for  syncope.     Physical Exam Updated Vital Signs BP (!) 148/84 (BP Location: Left Arm)   Pulse 63   Resp (!) 23   Ht 5\' 2"  (1.575 m)   Wt 81.6 kg (180 lb)   SpO2 (!) 84%   BMI 32.92 kg/m   Physical Exam  Constitutional: She is oriented to person, place, and time. She appears well-developed and well-nourished. She has a sickly appearance. She appears distressed.  Uncomfortable appearing elderly female with CPAP mask in place  HENT:  Head: Normocephalic and atraumatic.  Eyes: Conjunctivae and EOM are normal.  Cardiovascular: Normal rate and regular rhythm.   Pulmonary/Chest: Accessory muscle usage present. No stridor. Tachypnea noted. She is in respiratory distress. She has decreased breath sounds.  Abdominal: She exhibits no distension.  Musculoskeletal: She exhibits no edema.  Neurological: She is alert and oriented to person, place, and time. She displays atrophy. No cranial nerve deficit. She exhibits normal muscle tone. She displays no seizure activity.  Skin: Skin is warm and dry.  Psychiatric: She has a normal mood and affect. Cognition and memory are not impaired.  Nursing note and vitals reviewed.    ED Treatments / Results  Labs (all labs ordered are listed, but only abnormal results are displayed) Labs Reviewed  COMPREHENSIVE METABOLIC PANEL - Abnormal; Notable for the following:       Result Value   Glucose, Bld 103 (*)    Calcium 8.8 (*)    Albumin 3.4 (*)    AST 46 (*)    ALT 90 (*)    GFR calc non Af Amer 59 (*)    All other components within normal limits  BRAIN NATRIURETIC PEPTIDE - Abnormal; Notable for the following:    B Natriuretic Peptide 1,395.0 (*)    All other components within normal limits  CBC WITH DIFFERENTIAL/PLATELET  TROPONIN I    EKG  EKG Interpretation  Date/Time:  Tuesday October 19 2016 18:11:09 EDT Ventricular Rate:  58 PR Interval:    QRS Duration: 115 QT Interval:  466 QTC Calculation: 458 R Axis:   39 Text  Interpretation:  Sinus rhythm Prolonged PR interval Nonspecific intraventricular conduction delay T wave abnormality Artifact Abnormal ekg Confirmed by Gerhard Munch [phone]) on 10/19/2016 6:29:51 PM       Radiology Dg Chest 2 View  Result Date: 10/19/2016 CLINICAL DATA:  Shortness of breath EXAM: CHEST  2 VIEW COMPARISON:  Jul 16, 2016 FINDINGS: There is a moderate right pleural effusion which is larger in the interval with underlying opacity. Mild pulmonary venous congestion without overt edema. There is a compression fracture in a lower thoracic vertebral body which is new when compared to May of 2018. No other acute abnormalities. IMPRESSION: 1. There is a compression fracture of a lower thoracic vertebral body which is  new compared to May 2018. Recommend clinical correlation for pain in this region. MRI could further assess acuity if clinically warranted. 2. The moderate right pleural effusion is larger in the interval. Mild pulmonary venous congestion. Electronically Signed   By: Gerome Sam III M.D   On: 10/19/2016 19:35    Procedures Procedures (including critical care time)  Medications Ordered in ED Medications  furosemide (LASIX) injection 60 mg (not administered)  albuterol (PROVENTIL) (2.5 MG/3ML) 0.083% nebulizer solution 5 mg (5 mg Nebulization Given 10/19/16 1828)     Initial Impression / Assessment and Plan / ED Course  I have reviewed the triage vital signs and the nursing notes.  Pertinent labs & imaging results that were available during my care of the patient were reviewed by me and considered in my medical decision making (see chart for details).  After the initial evaluation was conducted while the patient was receiving noninvasive continuous pressure airway support, she had sat of 98% - abnormal    Update: the patient was transitioned to nasal cannula, and with a 4 L support via nasal cannula patient had pulse oxygenation ocean of 95%, abnormal  9:12 PM On  repeat exam patient appears substantially better than on arrival, no longer in respiratory distress. However, she has persistent tachypnea. Attempt at weaning the patient from supplemental oxygen is unsuccessful, with persistent hypoxia as well as increased work of breathing. With lab values concern for worsening congestive heart failure as well as pleural effusion and after receiving Lasix, continued oxygen, albuterol in the emergency deformity, the patient was admitted for further evaluation and management.  Final Clinical Impressions(s) / ED Diagnoses  Respiratory distress Elevated BNP Pleural effusion   CRITICAL CARE Performed by: Gerhard Munch Total critical care time: 40 minutes Critical care time was exclusive of separately billable procedures and treating other patients. Critical care was necessary to treat or prevent imminent or life-threatening deterioration. Critical care was time spent personally by me on the following activities: development of treatment plan with patient and/or surrogate as well as nursing, discussions with consultants, evaluation of patient's response to treatment, examination of patient, obtaining history from patient or surrogate, ordering and performing treatments and interventions, ordering and review of laboratory studies, ordering and review of radiographic studies, pulse oximetry and re-evaluation of patient's condition.    Gerhard Munch, MD 10/19/16 2114

## 2016-10-19 NOTE — ED Notes (Signed)
Report to Tandra, RN 

## 2016-10-19 NOTE — Telephone Encounter (Signed)
Spoke with husband and he stated he would take patient to [facility]

## 2016-10-20 ENCOUNTER — Inpatient Hospital Stay (HOSPITAL_COMMUNITY): Payer: Medicare Other

## 2016-10-20 DIAGNOSIS — I5033 Acute on chronic diastolic (congestive) heart failure: Secondary | ICD-10-CM

## 2016-10-20 DIAGNOSIS — I481 Persistent atrial fibrillation: Secondary | ICD-10-CM

## 2016-10-20 DIAGNOSIS — Z8673 Personal history of transient ischemic attack (TIA), and cerebral infarction without residual deficits: Secondary | ICD-10-CM

## 2016-10-20 DIAGNOSIS — R0602 Shortness of breath: Secondary | ICD-10-CM

## 2016-10-20 DIAGNOSIS — J90 Pleural effusion, not elsewhere classified: Secondary | ICD-10-CM

## 2016-10-20 DIAGNOSIS — R410 Disorientation, unspecified: Secondary | ICD-10-CM

## 2016-10-20 DIAGNOSIS — I10 Essential (primary) hypertension: Secondary | ICD-10-CM

## 2016-10-20 DIAGNOSIS — I361 Nonrheumatic tricuspid (valve) insufficiency: Secondary | ICD-10-CM

## 2016-10-20 LAB — ECHOCARDIOGRAM COMPLETE
AO mean calculated velocity dopler: 118 cm/s
AOPV: 0.65 m/s
AOVTI: 52.4 cm
AV Area VTI index: 0.67 cm2/m2
AV Area mean vel: 1.68 cm2
AV area mean vel ind: 0.85 cm2/m2
AVA: 1.32 cm2
AVAREAVTI: 1.66 cm2
AVCELMEANRAT: 0.66
AVG: 7 mmHg
AVPG: 14 mmHg
AVPHT: 440 ms
AVPKVEL: 185 cm/s
CHL CUP AV PEAK INDEX: 0.84
CHL CUP AV VEL: 1.32
CHL CUP RV SYS PRESS: 61 mmHg
CHL CUP TV REG PEAK VELOCITY: 382 cm/s
EWDT: 169 ms
FS: 30 % (ref 28–44)
Height: 62 in
IV/PV OW: 1.18
LA ID, A-P, ES: 40 mm
LA diam end sys: 40 mm
LA vol A4C: 61 ml
LA vol: 71.5 mL
LADIAMINDEX: 2.03 cm/m2
LAVOLIN: 36.2 mL/m2
LV PW d: 8.88 mm — AB (ref 0.6–1.1)
LV SIMPSON'S DISK: 62
LVDIAVOL: 100 mL (ref 46–106)
LVDIAVOLIN: 51 mL/m2
LVOT VTI: 27.3 cm
LVOT area: 2.54 cm2
LVOT diameter: 18 mm
LVOT peak VTI: 0.52 cm
LVOT peak grad rest: 6 mmHg
LVOT peak vel: 121 cm/s
LVOTSV: 69 mL
LVSYSVOL: 38 mL
LVSYSVOLIN: 19 mL/m2
MV Dec: 169
MVPG: 9 mmHg
MVPKEVEL: 146 m/s
Stroke v: 62 ml
TAPSE: 16.1 mm
TR max vel: 382 cm/s
Valve area index: 0.67
Weight: 3017.6 oz

## 2016-10-20 LAB — BASIC METABOLIC PANEL
Anion gap: 8 (ref 5–15)
BUN: 16 mg/dL (ref 6–20)
CHLORIDE: 97 mmol/L — AB (ref 101–111)
CO2: 33 mmol/L — AB (ref 22–32)
CREATININE: 0.86 mg/dL (ref 0.44–1.00)
Calcium: 8.4 mg/dL — ABNORMAL LOW (ref 8.9–10.3)
GFR calc Af Amer: >60 mL/min (ref >60)
GFR calc non Af Amer: 59 mL/min — ABNORMAL LOW (ref >60)
GLUCOSE: 115 mg/dL — AB (ref 65–99)
POTASSIUM: 3.2 mmol/L — AB (ref 3.5–5.1)
Sodium: 138 mmol/L (ref 135–145)

## 2016-10-20 LAB — URINALYSIS, ROUTINE W REFLEX MICROSCOPIC
BACTERIA UA: NONE SEEN
Bilirubin Urine: NEGATIVE
Glucose, UA: NEGATIVE mg/dL
KETONES UR: NEGATIVE mg/dL
Nitrite: NEGATIVE
PH: 6 (ref 5.0–8.0)
Protein, ur: 100 mg/dL — AB
SPECIFIC GRAVITY, URINE: 1.01 (ref 1.005–1.030)
SQUAMOUS EPITHELIAL / LPF: NONE SEEN

## 2016-10-20 LAB — BLOOD GAS, ARTERIAL
Acid-Base Excess: 6.8 mmol/L — ABNORMAL HIGH (ref 0.0–2.0)
BICARBONATE: 30.2 mmol/L — AB (ref 20.0–28.0)
DRAWN BY: 270161
O2 CONTENT: 4 L/min
O2 SAT: 95.9 %
PATIENT TEMPERATURE: 37
PO2 ART: 80.7 mmHg — AB (ref 83.0–108.0)
pCO2 arterial: 46.7 mmHg (ref 32.0–48.0)
pH, Arterial: 7.438 (ref 7.350–7.450)

## 2016-10-20 LAB — PROTIME-INR
INR: 3
PROTHROMBIN TIME: 31.8 s — AB (ref 11.4–15.2)

## 2016-10-20 MED ORDER — POTASSIUM CHLORIDE CRYS ER 10 MEQ PO TBCR
10.0000 meq | EXTENDED_RELEASE_TABLET | ORAL | Status: DC
  Administered 2016-10-21 – 2016-10-24 (×3): 10 meq via ORAL
  Filled 2016-10-20 (×2): qty 1

## 2016-10-20 MED ORDER — WARFARIN SODIUM 1 MG PO TABS
1.0000 mg | ORAL_TABLET | Freq: Once | ORAL | Status: DC
  Filled 2016-10-20: qty 1

## 2016-10-20 MED ORDER — POTASSIUM CHLORIDE CRYS ER 20 MEQ PO TBCR
40.0000 meq | EXTENDED_RELEASE_TABLET | Freq: Once | ORAL | Status: DC
  Filled 2016-10-20: qty 2

## 2016-10-20 MED ORDER — ALBUTEROL SULFATE (2.5 MG/3ML) 0.083% IN NEBU
2.5000 mg | INHALATION_SOLUTION | Freq: Three times a day (TID) | RESPIRATORY_TRACT | Status: DC
  Administered 2016-10-21 – 2016-10-23 (×9): 2.5 mg via RESPIRATORY_TRACT
  Filled 2016-10-20 (×9): qty 3

## 2016-10-20 MED ORDER — WARFARIN - PHARMACIST DOSING INPATIENT: Status: DC

## 2016-10-20 MED ORDER — LORAZEPAM 1 MG PO TABS
1.0000 mg | ORAL_TABLET | Freq: Three times a day (TID) | ORAL | Status: DC | PRN
  Administered 2016-10-20 (×2): 1 mg via ORAL
  Filled 2016-10-20 (×3): qty 1

## 2016-10-20 NOTE — Progress Notes (Signed)
*  PRELIMINARY RESULTS* Echocardiogram 2D Echocardiogram has been performed.  [name] 10/20/2016, 4:31 PM

## 2016-10-20 NOTE — Consult Note (Signed)
Cardiology Consultation:   Patient ID: Danielle Brooks; [id number]; [birth year]   Admit date: 10/19/2016 Date of Consult: 10/20/2016  Primary Care Provider: Ignatius Specking, MD Primary Cardiologist:[name] Primary Electrophysiologist:  NA   Patient Profile:   Danielle Brooks is a 83 y.o. female with a hx of Atrial fibrillation who is being seen today for the evaluation of CHF at the request of Dr. Kerry Hough  History of Present Illness:   Ms. Easler is a patient [name] with history of TIA, diastolic dysfunction, hypertension, COPD, PAF on flecainide and Coumadin who was recently seen on the office 10/12/16 and was in atrial fibrillation with some diastolic dysfunction. She was given and asked her dose of flecainide 100 mg over several days. Patient also had some diastolic dysfunction and was continued on Lasix. She had a recent fall with sternal fracture and had been hospitalized at [facility] and had undergone rehabilitation and was still doing physical therapy. She's had a negative ischemic workup in the past with normal Cardiolite 09/2014. Last 2-D echo 03/2015 LVEF 55-60% with normal wall motion mild to moderate aortic regurgitation, left atrium was mildly dilated.    Patient called the office yesterday complaining of worsening shortness of breath and was told to go to the emergency room. She was found to have a larger pleural effusion and vascular congestion. Troponin was negative normal renal function. She was given BiPAP and improved. EKG on admission shows that the patient is back in normal sinus rhythm.  Patient currently confused and hallucinating. Can't give any reliable history. Trying to take O2 off and stand up.    Past Medical History:  Diagnosis Date  . Atrial fibrillation (HCC)    Paroxysmal, history of normal LV and negative ischemic workup  . Mixed hyperlipidemia   . Osteoporosis   . TIA (transient ischemic attack)   . Vitamin D deficiency     History reviewed. No  pertinent surgical history.   Home Medications:  Prior to Admission medications   Medication Sig Start Date End Date Taking? Authorizing Provider  flecainide (TAMBOCOR) 100 MG tablet Take 1 tablet (100 mg total) by mouth 2 (two) times daily. 10/12/16  Yes Jonelle Sidle, MD  furosemide (LASIX) 20 MG tablet Take 2 tabs (40mg ) on Monday Wednesday Friday alternating with 20 mg 1 tab Patient taking differently: Take 20 mg by mouth daily. Patient changed directions to one daily (Take 2 tabs (40mg ) on Monday Wednesday Friday alternating with 20 mg 1 tab) 08/08/15  Yes Jonelle Sidle, MD  metoprolol succinate (TOPROL-XL) 50 MG 24 hr tablet take 1 tablet by mouth twice a day 06/14/16  Yes Jonelle Sidle, MD  potassium chloride (K-DUR) 10 MEQ tablet Take 10-20 mEq by mouth daily. With lasix:Patient changed directions to one daily (Take 2 tabs (40mg ) on Monday Wednesday Friday alternating with 20 mg 1 tab) 01/15/16  Yes [provider]  traZODone (DESYREL) 100 MG tablet take 1/2 tablet by mouth at bedtime **MAY INCREASE TO 1 TABLET AT BEDTIME IF NEEDED** Patient taking differently: Take 100 mg by mouth at bedtime.  01/18/12  Yes Jonelle Sidle, MD  Vitamin D, Ergocalciferol, (DRISDOL) 50000 UNITS CAPS Take 50,000 Units by mouth every Tuesday.    Yes [provider]  warfarin (COUMADIN) 5 MG tablet Take 5 mg by mouth every evening. Managed by [facility] office   Yes [provider]    Inpatient Medications: Scheduled Meds: . albuterol  2.5 mg Nebulization Q6H  .  flecainide  100 mg Oral BID  . furosemide  40 mg Intravenous BID  . metoprolol succinate  50 mg Oral BID  . potassium chloride  10-20 mEq Oral Daily  . traZODone  100 mg Oral QHS   Continuous Infusions:  PRN Meds: acetaminophen **OR** acetaminophen, albuterol, LORazepam, ondansetron **OR** ondansetron (ZOFRAN) IV  Allergies:    Allergies  Allergen Reactions  . Bactrim [Sulfamethoxazole-Trimethoprim] Rash   . Codeine Itching  . Novocain [Procaine Hcl] Other (See Comments)    Sob and passed out years ago at dentist office    Social History:   Social History   Social History  . Marital status: Married    Spouse name: JERRY  . Number of children: N/A  . Years of education: N/A   Occupational History  . RETIRED     RN   Social History Main Topics  . Smoking status: Never Smoker  . Smokeless tobacco: Never Used  . Alcohol use No  . Drug use: No  . Sexual activity: No   Other Topics Concern  . Not on file   Social History Narrative  . No narrative on file    Family History:    Family History  Problem Relation Age of Onset  . Coronary artery disease Father   . Alzheimer's disease Mother   . Hypertension Sister      ROS:  Please see the history of present illness.  Review of Systems  Reason unable to perform ROS: Confused and hallucinating.  Constitution: Negative.  HENT: Negative.   Eyes: Negative.   Cardiovascular: Negative.   Respiratory: Negative.   Hematologic/Lymphatic: Negative.   Musculoskeletal: Negative.  Negative for joint pain.  Gastrointestinal: Negative.   Genitourinary: Negative.   Neurological: Negative.      All other ROS reviewed and negative.     Physical Exam/Data:   Vitals:   10/20/16 0132 10/20/16 0340 10/20/16 0515 10/20/16 0732  BP:   (!) 148/88   Pulse:   68   Resp:   20   Temp:   98.5 F (36.9 C)   TempSrc:   Oral   SpO2: 93%  94% 92%  Weight:  188 lb 9.6 oz (85.5 kg)    Height:        Intake/Output Summary (Last 24 hours) at 10/20/16 0804 Last data filed at 10/19/16 2314  Gross per 24 hour  Intake              275 ml  Output              700 ml  Net             -425 ml   Filed Weights   10/19/16 1811 10/19/16 2258 10/20/16 0340  Weight: 180 lb (81.6 kg) 190 lb (86.2 kg) 188 lb 9.6 oz (85.5 kg)   Body mass index is 34.5 kg/m.  General:  obese, in no acute distress  HEENT: normal Lymph: no adenopathy Neck:  increased JVD Endocrine:  No thryomegaly Vascular: No carotid bruits; FA pulses 2+ bilaterally without bruits  Cardiac:  normal S1, S2; RRR; no murmur distant HS Lungs:  Decreased breath sounds with crackles Abd: soft, nontender, no hepatomegaly  Ext: plus 2 edema bilaterally Musculoskeletal:  No deformities, BUE and BLE strength normal and equal Skin: warm and dry  Neuro:   Confused Psych:  Hallucinating  EKG:  The EKG was personally reviewed and demonstrates:  NSR with nonspecific ST changes no acute change. Telemetry:  Telemetry was personally reviewed and demonstrates:  NSR  Relevant CV Studies:  Echocardiogram 03/05/2015: Study Conclusions  - Left ventricle: The cavity size was normal. Wall thickness was   normal. Systolic function was normal. The estimated ejection   fraction was in the range of 55% to 60%. Wall motion was normal;   there were no regional wall motion abnormalities. - Aortic valve: Mildly to moderately calcified annulus. Trileaflet;   mildly thickened leaflets. There was mild to moderate   regurgitation. Valve area (VTI): 2.9 cm^2. Valve area (Vmax):   2.72 cm^2. Valve area (Vmean): 2.74 cm^2. - Mitral valve: Mildly calcified annulus. Mildly thickened leaflets   . There was mild regurgitation. - Left atrium: The atrium was mildly dilated. - Right atrium: The atrium was mildly dilated. - Pulmonary arteries: Systolic pressure was mildly to moderately   increased. PA peak pressure: 40 mm Hg (S). - Technically adequate study.   Lexiscan Cardiolite 10/23/2014: Atrial fibrillation throughout with no diagnostic ST segment changes, probable variable soft tissue attenuation affecting the mid anterolateral and apical anteroseptal walls, overall low risk, LVEF 64%.  Laboratory Data:  Chemistry Recent Labs Lab 10/19/16 1833  NA 139  K 4.0  CL 101  CO2 28  GLUCOSE 103*  BUN 19  CREATININE 0.87  CALCIUM 8.8*  GFRNONAA 59*  GFRAA >60  ANIONGAP 10      Recent Labs Lab 10/19/16 1833  PROT 6.8  ALBUMIN 3.4*  AST 46*  ALT 90*  ALKPHOS 72  BILITOT 0.5   Hematology Recent Labs Lab 10/19/16 1833  WBC 7.0  RBC 4.34  HGB 12.4  HCT 38.6  MCV 88.9  MCH 28.6  MCHC 32.1  RDW 14.5  PLT 317   Cardiac Enzymes Recent Labs Lab 10/19/16 1833  TROPONINI <0.03   No results for input(s): TROPIPOC in the last 168 hours.  BNP Recent Labs Lab 10/19/16 1842  BNP 1,395.0*    DDimer No results for input(s): DDIMER in the last 168 hours.  Radiology/Studies:  Dg Chest 2 View  Result Date: 10/19/2016 CLINICAL DATA:  Shortness of breath EXAM: CHEST  2 VIEW COMPARISON:  Jul 16, 2016 FINDINGS: There is a moderate right pleural effusion which is larger in the interval with underlying opacity. Mild pulmonary venous congestion without overt edema. There is a compression fracture in a lower thoracic vertebral body which is new when compared to May of 2018. No other acute abnormalities. IMPRESSION: 1. There is a compression fracture of a lower thoracic vertebral body which is new compared to May 2018. Recommend clinical correlation for pain in this region. MRI could further assess acuity if clinically warranted. 2. The moderate right pleural effusion is larger in the interval. Mild pulmonary venous congestion. Electronically Signed   By: Gerome Sam III M.D   On: 10/19/2016 19:35    Assessment and Plan:   Acute on chronic diastolic CHF  BNP 1395. repeat 2-D echo pending. Negative 425 overnight after Lasix 60 mg x1. Now on 40 mg IV BID. Continue to diurese.  PAF on flecainide and Coumadin. Patient was recently in atrial fibrillation at office visit 10/12/16 and was given extra flecainide, now in normal sinus rhythm  Recent fall with sternal fracture underwent rehabilitation and still having PT, now confused and hallucinating and high risk for fall on Coumadin. Have alerted RN who is at bedside and will notify hospitalist.  History of TIA on  Coumadin  Hypertension BP up a little but agitated.   Signed, Elon Jester  Lilla Shook  10/20/2016 8:04 AM   The patient was seen and examined, and I agree with the history, physical exam, assessment and plan as documented above, with modifications as noted below. I have also personally reviewed all relevant documentation, old records, labs, and both radiographic and cardiovascular studies. I have also independently interpreted old and new ECG's.  83 yr old woman hospitalized for worsening shortness of breath, weakness, confusion, and bilateral leg edema. Chest xray showed moderate right pleural effusion and some venous congestion. She is now in sinus rhythm after flecainide dose was increased by [name]. Normally anticoagulated with warfarin and INR is pending. She is confused and disoriented but did say she felt better than yesterday (less short of breath).  Recommendations: Currently on IV Lasix 40 mg BID and already received an initial dose of 60 mg IV and has put out 425 cc. Pharmacy to manage warfarin. INR is pending. Continue current medical therapy.   Prentice Docker, MD, [facility]  10/20/2016 9:31 AM

## 2016-10-20 NOTE — Care Management (Signed)
CM received voicemail from Maralyn Sago of [place]. Patient is active with [facility] health. Shanda Bumps, CM made aware.

## 2016-10-20 NOTE — Progress Notes (Signed)
ANTICOAGULATION CONSULT NOTE  Pharmacy Consult for Warfarin Indication: atrial fibrillation  Allergies  Allergen Reactions  . Bactrim [Sulfamethoxazole-Trimethoprim] Rash  . Codeine Itching  . Novocain [Procaine Hcl] Other (See Comments)    Sob and passed out years ago at dentist office   Patient Measurements: Height: 5\' 2"  (157.5 cm) Weight: 188 lb 9.6 oz (85.5 kg) IBW/kg (Calculated) : 50.1 HEPARIN DW (KG): 69.7   Vital Signs: Temp: 98.5 F (36.9 C) (08/22 0515) Temp Source: Oral (08/22 0515) BP: 148/88 (08/22 0515) Pulse Rate: 68 (08/22 0515)  Labs:  Recent Labs  10/19/16 1833 10/20/16 1027  HGB 12.4  --   HCT 38.6  --   PLT 317  --   LABPROT  --  31.8*  INR  --  3.00  CREATININE 0.87 0.86  TROPONINI <0.03  --     Estimated Creatinine Clearance: 47.7 mL/min (by C-G formula based on SCr of 0.86 mg/dL).   Medical History: Past Medical History:  Diagnosis Date  . Atrial fibrillation (HCC)    Paroxysmal, history of normal LV and negative ischemic workup  . Mixed hyperlipidemia   . Osteoporosis   . TIA (transient ischemic attack)   . Vitamin D deficiency     Medications:  Prescriptions Prior to Admission  Medication Sig Dispense Refill Last Dose  . flecainide (TAMBOCOR) 100 MG tablet Take 1 tablet (100 mg total) by mouth 2 (two) times daily. 65 tablet 3 10/19/2016 at Unknown time  . furosemide (LASIX) 20 MG tablet Take 2 tabs (40mg ) on Monday Wednesday Friday alternating with 20 mg 1 tab (Patient taking differently: Take 20-40 mg by mouth daily. Patient changed directions to one daily (Take 2 tabs (40mg ) on Monday Wednesday Friday alternating with 20 mg (1 tab) Tuesday, Thursday, Saturday and Sunday.) 180 tablet 3 10/19/2016 at Unknown time  . metoprolol succinate (TOPROL-XL) 50 MG 24 hr tablet take 1 tablet by mouth twice a day 60 tablet 0 10/19/2016 at 800a  . potassium chloride (K-DUR) 10 MEQ tablet Take 10-20 mEq by mouth daily. With lasix:Patient changed  directions to one daily (Take 2 tabs (20mg ) on Monday Wednesday Friday alternating with 10 mg 1 tab on Tuesday, Thursday, Saturday, Sunday)  0 10/19/2016 at Unknown time  . traZODone (DESYREL) 100 MG tablet take 1/2 tablet by mouth at bedtime **MAY INCREASE TO 1 TABLET AT BEDTIME IF NEEDED** (Patient taking differently: Take 100 mg by mouth at bedtime. ) 30 tablet 2 10/18/2016 at Unknown time  . Vitamin D, Ergocalciferol, (DRISDOL) 50000 UNITS CAPS Take 50,000 Units by mouth every Tuesday.    10/19/2016 at Unknown time  . warfarin (COUMADIN) 5 MG tablet Take 5 mg by mouth every evening. Managed by [facility] office   10/18/2016 at 1700   Assessment: Okay for Protocol, INR 3.0.  Last dose 8/20 per home med list.  Goal of Therapy:  INR 2-3   Plan:  Warfarin 1mg  PO x 1. Daily PT/INR  Mady Gemma 10/20/2016,12:08 PM

## 2016-10-20 NOTE — Progress Notes (Signed)
Noted patient is more confused compared to yesterday's charting, patient urine pink tinged. Patient is aware of herself, unaware of time, day and which hospital she is in. Patient is aware she is in hospital. MD notified of change in cognition.

## 2016-10-20 NOTE — Progress Notes (Signed)
PROGRESS NOTE    Danielle Brooks  MCE:[id number] DOB: [birth year] DOA: 10/19/2016 PCP: Ignatius Specking, MD    Brief Narrative:  83 year old female with a history of atrial fibrillation on Coumadin, diastolic CHF, was sent to the hospital by her cardiologist for shortness of breath. She had worsening pedal edema and had evidence of decompensated CHF. She is admitted for further diuresis.   Assessment & Plan:   Principal Problem:   CHF, acute on chronic [facility]) Active Problems:   Paroxysmal atrial fibrillation (HCC)   Essential hypertension   1. Acute on chronic CHF, suspect diastolic . repeat echocardiogram has been ordered. Cardiology following. Continue on twice a day Lasix. Monitor intake and output. 2. Paroxysmal atrial fibrillation. Rate is controlled. Continue on flecainide and beta blockers. She is anticoagulated with Coumadin. 3. COPD . continue bronchodilators. No evidence of wheezing today. 4. Worsening pleural effusion. Suspect is related to CHF. Continue to monitor with diuresis. If does not improve, may need thoracentesis. 5. Hypertension. Blood pressures controlled. Continue current medications. 6. Confusion. Unclear etiology. She did not receive any sedative medications. Since she is on Coumadin, will check CT head for any underlying pathology. Check urinalysis and ABG.   DVT prophylaxis: Coumadin Code Status: Full code Family Communication: No family present Disposition Plan: Discharge back to [facility] once improved   Consultants:   Cardiology  Procedures:     Antimicrobials:      Subjective: confused  Objective: Vitals:   10/20/16 0515 10/20/16 0732 10/20/16 1443 10/20/16 1446  BP: (!) 148/88  (!) 152/54   Pulse: 68  (!) 57   Resp: 20     Temp: 98.5 F (36.9 C)     TempSrc: Oral     SpO2: 94% 92% 98% 100%  Weight:      Height:        Intake/Output Summary (Last 24 hours) at 10/20/16 1621 Last data filed at 10/20/16 1500  Gross per 24 hour    Intake              515 ml  Output              700 ml  Net             -185 ml   Filed Weights   10/19/16 1811 10/19/16 2258 10/20/16 0340  Weight: 81.6 kg (180 lb) 86.2 kg (190 lb) 85.5 kg (188 lb 9.6 oz)    Examination:  General exam: Appears calm and comfortable  Respiratory system: diminished breath sounds at bases. Respiratory effort normal. Cardiovascular system: S1 & S2 heard, RRR. No JVD, murmurs, rubs, gallops or clicks. 2+ pedal edema. Gastrointestinal system: Abdomen is nondistended, soft and nontender. No organomegaly or masses felt. Normal bowel sounds heard. Central nervous system: confused, somnolent. No focal neurological deficits. Extremities: Symmetric 5 x 5 power. Skin: No rashes, lesions or ulcers Psychiatry: confused     Data Reviewed: I have personally reviewed following labs and imaging studies  CBC:  Recent Labs Lab 10/19/16 1833  WBC 7.0  NEUTROABS 4.8  HGB 12.4  HCT 38.6  MCV 88.9  PLT 317   Basic Metabolic Panel:  Recent Labs Lab 10/19/16 1833 10/20/16 1027  NA 139 138  K 4.0 3.2*  CL 101 97*  CO2 28 33*  GLUCOSE 103* 115*  BUN 19 16  CREATININE 0.87 0.86  CALCIUM 8.8* 8.4*   GFR: Estimated Creatinine Clearance: 47.7 mL/min (by C-G formula based on SCr of 0.86 mg/dL). Liver  Function Tests:  Recent Labs Lab 10/19/16 1833  AST 46*  ALT 90*  ALKPHOS 72  BILITOT 0.5  PROT 6.8  ALBUMIN 3.4*   No results for input(s): LIPASE, AMYLASE in the last 168 hours. No results for input(s): AMMONIA in the last 168 hours. Coagulation Profile:  Recent Labs Lab 10/20/16 1027  INR 3.00   Cardiac Enzymes:  Recent Labs Lab 10/19/16 1833  TROPONINI <0.03   BNP (last 3 results) No results for input(s): PROBNP in the last 8760 hours. HbA1C: No results for input(s): HGBA1C in the last 72 hours. CBG: No results for input(s): GLUCAP in the last 168 hours. Lipid Profile: No results for input(s): CHOL, HDL, LDLCALC, TRIG,  CHOLHDL, LDLDIRECT in the last 72 hours. Thyroid Function Tests:  Recent Labs  10/19/16 1833  TSH 2.014   Anemia Panel: No results for input(s): VITAMINB12, FOLATE, FERRITIN, TIBC, IRON, RETICCTPCT in the last 72 hours. Sepsis Labs: No results for input(s): PROCALCITON, LATICACIDVEN in the last 168 hours.  No results found for this or any previous visit (from the past 240 hour(s)).       Radiology Studies: Dg Chest 2 View  Result Date: 10/19/2016 CLINICAL DATA:  Shortness of breath EXAM: CHEST  2 VIEW COMPARISON:  Jul 16, 2016 FINDINGS: There is a moderate right pleural effusion which is larger in the interval with underlying opacity. Mild pulmonary venous congestion without overt edema. There is a compression fracture in a lower thoracic vertebral body which is new when compared to May of 2018. No other acute abnormalities. IMPRESSION: 1. There is a compression fracture of a lower thoracic vertebral body which is new compared to May 2018. Recommend clinical correlation for pain in this region. MRI could further assess acuity if clinically warranted. 2. The moderate right pleural effusion is larger in the interval. Mild pulmonary venous congestion. Electronically Signed   By: Gerome Sam III M.D   On: 10/19/2016 19:35        Scheduled Meds: . albuterol  2.5 mg Nebulization Q6H  . flecainide  100 mg Oral BID  . furosemide  40 mg Intravenous BID  . metoprolol succinate  50 mg Oral BID  . [START ON 10/21/2016] potassium chloride  10 mEq Oral Once per day on Sun Tue Thu Sat  . potassium chloride  20 mEq Oral Q M,W,F  . potassium chloride  40 mEq Oral Once  . traZODone  100 mg Oral QHS  . warfarin  1 mg Oral Once  . [START ON 10/21/2016] Warfarin - Pharmacist Dosing Inpatient   Does not apply Q24H   Continuous Infusions:   LOS: 1 day    Time spent:    Damarkus Balis, MD [facility] Pager [phone]  If 7PM-7AM, please contact  night-coverage [email or web address] Password [email or web address] 10/20/2016, 4:21 PM

## 2016-10-21 DIAGNOSIS — R600 Localized edema: Secondary | ICD-10-CM

## 2016-10-21 DIAGNOSIS — I480 Paroxysmal atrial fibrillation: Secondary | ICD-10-CM

## 2016-10-21 LAB — BASIC METABOLIC PANEL
ANION GAP: 7 (ref 5–15)
BUN: 16 mg/dL (ref 6–20)
CALCIUM: 8.4 mg/dL — AB (ref 8.9–10.3)
CO2: 32 mmol/L (ref 22–32)
Chloride: 98 mmol/L — ABNORMAL LOW (ref 101–111)
Creatinine, Ser: 0.8 mg/dL (ref 0.44–1.00)
GFR calc Af Amer: >60 mL/min (ref >60)
GFR calc non Af Amer: >60 mL/min (ref >60)
GLUCOSE: 96 mg/dL (ref 65–99)
Potassium: 3 mmol/L — ABNORMAL LOW (ref 3.5–5.1)
Sodium: 137 mmol/L (ref 135–145)

## 2016-10-21 LAB — PROTIME-INR
INR: 2.47
Prothrombin Time: 27.2 seconds — ABNORMAL HIGH (ref 11.4–15.2)

## 2016-10-21 MED ORDER — POTASSIUM CHLORIDE CRYS ER 20 MEQ PO TBCR
40.0000 meq | EXTENDED_RELEASE_TABLET | ORAL | Status: AC
  Administered 2016-10-21 (×2): 40 meq via ORAL
  Filled 2016-10-21: qty 2

## 2016-10-21 MED ORDER — STROKE: EARLY STAGES OF RECOVERY BOOK
Freq: Once | Status: AC
  Administered 2016-10-21: 17:00:00
  Filled 2016-10-21: qty 1

## 2016-10-21 MED ORDER — METOLAZONE 5 MG PO TABS
5.0000 mg | ORAL_TABLET | Freq: Once | ORAL | Status: AC
  Administered 2016-10-21: 5 mg via ORAL
  Filled 2016-10-21: qty 1

## 2016-10-21 MED ORDER — FUROSEMIDE 10 MG/ML IJ SOLN
60.0000 mg | Freq: Once | INTRAMUSCULAR | Status: AC
  Administered 2016-10-21: 60 mg via INTRAVENOUS
  Filled 2016-10-21: qty 6

## 2016-10-21 MED ORDER — FUROSEMIDE 10 MG/ML IJ SOLN
60.0000 mg | Freq: Two times a day (BID) | INTRAMUSCULAR | Status: DC
  Administered 2016-10-22 – 2016-10-23 (×3): 60 mg via INTRAVENOUS
  Filled 2016-10-21 (×4): qty 6

## 2016-10-21 MED ORDER — WARFARIN SODIUM 2.5 MG PO TABS
2.5000 mg | ORAL_TABLET | Freq: Once | ORAL | Status: DC
  Filled 2016-10-21: qty 1

## 2016-10-21 NOTE — Progress Notes (Addendum)
0745 - Patient sitting in bed off of oxygen and found to be slightly confused to time and events. Circumoral cyanosis noted. RT in and oxygen reapplied per [place]. Patient breathing labored.

## 2016-10-21 NOTE — Progress Notes (Signed)
Progress Note  Patient Name: Danielle Brooks Date of Encounter: 10/21/2016  Primary Cardiologist: [name]  Subjective   She is feeling better today and is less short of breath and less confused. She remembers meeting me yesterday. Still has leg swelling bilaterally.  Inpatient Medications    Scheduled Meds: . albuterol  2.5 mg Nebulization TID  . flecainide  100 mg Oral BID  . furosemide  60 mg Intravenous BID  . metoprolol succinate  50 mg Oral BID  . potassium chloride  10 mEq Oral Once per day on Sun Tue Thu Sat  . potassium chloride  20 mEq Oral Q M,W,F  . potassium chloride  40 mEq Oral Once  . traZODone  100 mg Oral QHS  . warfarin  2.5 mg Oral Once  . Warfarin - Pharmacist Dosing Inpatient   Does not apply Q24H   Continuous Infusions:  PRN Meds: acetaminophen **OR** acetaminophen, albuterol, LORazepam, ondansetron **OR** ondansetron (ZOFRAN) IV   Vital Signs    Vitals:   10/21/16 0317 10/21/16 0631 10/21/16 0723 10/21/16 1414  BP:  (!) 144/61    Pulse:  60    Resp:  20    Temp:  98.4 F (36.9 C)    TempSrc:  Oral    SpO2: 95% 100% (!) 88% 98%  Weight:      Height:        Intake/Output Summary (Last 24 hours) at 10/21/16 1421 Last data filed at 10/21/16 1400  Gross per 24 hour  Intake              720 ml  Output             1450 ml  Net             -730 ml   Filed Weights   10/19/16 1811 10/19/16 2258 10/20/16 0340  Weight: 180 lb (81.6 kg) 190 lb (86.2 kg) 188 lb 9.6 oz (85.5 kg)    Telemetry    NSR - Personally Reviewed  ECG     Physical Exam   GEN: No acute distress.   Neck: No JVD Cardiac: RRR, no murmurs, rubs, or gallops.  Respiratory: Poor air movement, diminished on right, prolonged expiratory phase bilaterally GI: Soft, nontender, non-distended  MS: 1+ pitting b/l leg edema Neuro:  Nonfocal  Psych: Normal affect   Labs    Chemistry Recent Labs Lab 10/19/16 1833 10/20/16 1027 10/21/16 0548  NA 139 138 137  K 4.0  3.2* 3.0*  CL 101 97* 98*  CO2 28 33* 32  GLUCOSE 103* 115* 96  BUN 19 16 16   CREATININE 0.87 0.86 0.80  CALCIUM 8.8* 8.4* 8.4*  PROT 6.8  --   --   ALBUMIN 3.4*  --   --   AST 46*  --   --   ALT 90*  --   --   ALKPHOS 72  --   --   BILITOT 0.5  --   --   GFRNONAA 59* 59* >60  GFRAA >60 >60 >60  ANIONGAP 10 8 7      Hematology Recent Labs Lab 10/19/16 1833  WBC 7.0  RBC 4.34  HGB 12.4  HCT 38.6  MCV 88.9  MCH 28.6  MCHC 32.1  RDW 14.5  PLT 317    Cardiac Enzymes Recent Labs Lab 10/19/16 1833  TROPONINI <0.03   No results for input(s): TROPIPOC in the last 168 hours.   BNP Recent Labs Lab 10/19/16 1842  BNP 1,395.0*     DDimer No results for input(s): DDIMER in the last 168 hours.   Radiology    Dg Chest 2 View  Result Date: 10/19/2016 CLINICAL DATA:  Shortness of breath EXAM: CHEST  2 VIEW COMPARISON:  Jul 16, 2016 FINDINGS: There is a moderate right pleural effusion which is larger in the interval with underlying opacity. Mild pulmonary venous congestion without overt edema. There is a compression fracture in a lower thoracic vertebral body which is new when compared to May of 2018. No other acute abnormalities. IMPRESSION: 1. There is a compression fracture of a lower thoracic vertebral body which is new compared to May 2018. Recommend clinical correlation for pain in this region. MRI could further assess acuity if clinically warranted. 2. The moderate right pleural effusion is larger in the interval. Mild pulmonary venous congestion. Electronically Signed   By: Gerome Sam III M.D   On: 10/19/2016 19:35   Ct Head Wo Contrast  Result Date: 10/20/2016 CLINICAL DATA:  Shortness of breath, altered level consciousness. Decompensated CHF, atrial fibrillation on Coumadin. EXAM: CT HEAD WITHOUT CONTRAST TECHNIQUE: Contiguous axial images were obtained from the base of the skull through the vertex without intravenous contrast. COMPARISON:  CT HEAD Jul 16, 2016  FINDINGS: Moderately motion degraded examination. BRAIN: No intraparenchymal hemorrhage, mass effect nor midline shift. Moderate ventriculomegaly with mild sulcal effacement at the convexities. Confluent supratentorial white matter hypodensities, worse within RIGHT juxta cortical 2D white matter. Focal blurring of the RIGHT frontal gray-white matter. No abnormal extra-axial fluid collections. Basal cisterns are patent. VASCULAR: Mild calcific atherosclerosis of the carotid siphons. SKULL: No skull fracture. No significant scalp soft tissue swelling. SINUSES/ORBITS: The mastoid air-cells and included paranasal sinuses are well-aerated.The included ocular globes and orbital contents are non-suspicious. Brass post bilateral ocular lens implants. OTHER: None. IMPRESSION: 1. Moderately motion degraded examination. 2. Acute to subacute small RIGHT frontal lobe/ MCA territories suspected infarct. 3. Worsening moderate to severe chronic small vessel ischemic disease. 4. Chronic mild suspected normal pressure hydrocephalus. Electronically Signed   By: Awilda Metro M.D.   On: 10/20/2016 17:46    Cardiac Studies   Echocardiogram (10/20/16):  - Left ventricle: The cavity size was normal. Wall thickness was   normal. Systolic function was normal. The estimated ejection   fraction was in the range of 55% to 60%. Wall motion was normal;   there were no regional wall motion abnormalities. Doppler   parameters are consistent with restrictive physiology, indicative   of decreased left ventricular diastolic compliance and/or   increased left atrial pressure. - Aortic valve: Moderately calcified annulus. Trileaflet;   moderately calcified leaflets. There was moderate regurgitation.   Mean gradient (S): 7 mm Hg. Peak gradient (S): 14 mm Hg. VTI   ratio of LVOT to aortic valve: 0.52. Moderately sclerotic to   mildly stenotic aortic valve. Valve area (Vmax): 1.66 cm^2. Valve   area (Vmean): 1.68 cm^2. - Mitral  valve: Moderately calcified annulus. There was mild   regurgitation. - Left atrium: The atrium was mildly dilated. - Right ventricle: The cavity size was mildly dilated. Systolic   function was mildly reduced. - Right atrium: The atrium was mildly dilated. Central venous   pressure (est): 8 mm Hg. - Atrial septum: No defect or patent foramen ovale was identified. - Tricuspid valve: There was mild regurgitation. - Pulmonary arteries: Systolic pressure was severely increased. PA   peak pressure: 66 mm Hg (S). - Pericardium, extracardiac: There was a right  pleural effusion.  Impressions:  - Normal LV wall thickness with LVEF 55-60% and restrictive   diastolic filling pattern. Mild left atrial enlargement.   Moderately calcified mitral annulus with mild mitral   regurgitation. Moderately sclerotic to mildly stenotic aortic   valve with moderate aortic regurgitation. Mild dilated right   ventricle with mildly reduced contraction. Mild right atrial   enlargement. Mild tricuspid regurgitation with evidence of severe   pulmonary hypertension, estimated PASP 66 mmHg. Right pleural   effusion is noted.  Patient Profile     83 y.o. female with history of atrial fibrillation, TIA, COPD, and hypertension admitted with acute on chronic diastolic heart failure.  Assessment & Plan    1. Acute on chronic diastolic heart failure: Still has leg edema. Breathing has improved. Echocardiogram demonstrated restrictive diastolic physiology as detailed above. She has had slightly over 1 L output since admission. She had been on Lasix 40 mg IV twice daily and this was increased to 60 mg IV twice daily today by the hospitalist. I spoke with him and he is considering consulting interventional radiology for pleurocentesis given right pleural effusion which was moderate in size. I will also give a one time dose of metolazone 5 mg.  2. Paroxysmal atrial fibrillation: Remains in sinus rhythm on flecainide and  metoprolol succinate. Anticoagulated with warfarin.  3. Hypertension: Blood pressure is mildly elevated. Will monitor in light of ongoing diuresis.  Signed, Prentice Docker, MD  10/21/2016, 2:21 PM

## 2016-10-21 NOTE — Progress Notes (Signed)
Patient oriented and appropriate. Color improved pink, visiting with family and friends. Breathing non labored and chest rise symmetrical. Output per Foley. Patient states she feels better than this morning.

## 2016-10-21 NOTE — Progress Notes (Signed)
PROGRESS NOTE    Danielle Brooks  ZOX:[id number] DOB: [birth year] DOA: 10/19/2016 PCP: Ignatius Specking, MD    Brief Narrative:  83 year old female with a history of atrial fibrillation on Coumadin, diastolic CHF, was sent to the hospital by her cardiologist for shortness of breath. She had worsening pedal edema and had evidence of decompensated CHF. She is admitted for further diuresis.   Assessment & Plan:   Principal Problem:   CHF, acute on chronic [facility]) Active Problems:   Paroxysmal atrial fibrillation (HCC)   Essential hypertension   1. Acute on chronic CHF, suspect diastolic . Echo shows preserved EF with restrictive pattern. Cardiology following. Urine output has been unimpressive. Will increase lasix to 60mg  BID. Monitor intake and output. 2. Paroxysmal atrial fibrillation. Rate is controlled. Continue on flecainide and beta blockers. She is anticoagulated with Coumadin. INR is therapeutic. Will hold off on further coumadin in case thoracentesis is needed 3. COPD . continue bronchodilators. No evidence of wheezing today. 4. Right pleural effusion. Suspect is related to CHF. Continue to monitor with diuresis. If does not improve, may need thoracentesis. 5. Hypertension. Blood pressures controlled. Continue current medications. 6. Acute/Subacute CVA. Patient was noted to be confused yesterday. CT head indicated acute/subacute infarct in right frontal/MCA distribution. She would not be a candidate since exact onset of symptoms is unknown, and she is also on anticoagulation. Will pursue further work up with MRI/MRA of head, carotid dopplers and neurology consultation   DVT prophylaxis: Coumadin Code Status: Full code Family Communication: discussed with husband and son in the room Disposition Plan: will need SNF placement on discharge   Consultants:   Cardiology  Procedures:  Echo: - Normal LV wall thickness with LVEF 55-60% and restrictive   diastolic filling pattern. Mild left  atrial enlargement.   Moderately calcified mitral annulus with mild mitral   regurgitation. Moderately sclerotic to mildly stenotic aortic   valve with moderate aortic regurgitation. Mild dilated right   ventricle with mildly reduced contraction. Mild right atrial   enlargement. Mild tricuspid regurgitation with evidence of severe   pulmonary hypertension, estimated PASP 66 mmHg. Right pleural   effusion is noted.    Antimicrobials:      Subjective: Feels that her breathing is getting better today. No cough. No chest pain  Objective: Vitals:   10/21/16 1414 10/21/16 1500 10/21/16 1635 10/21/16 1830  BP:  (!) 139/56 138/65 118/86  Pulse:  (!) 56 (!) 58 (!) 57  Resp:  19 18 18   Temp:  98.1 F (36.7 C) 98.1 F (36.7 C) 98.4 F (36.9 C)  TempSrc:  Oral Oral Oral  SpO2: 98% 100% 100% 100%  Weight:      Height:        Intake/Output Summary (Last 24 hours) at 10/21/16 1842 Last data filed at 10/21/16 1700  Gross per 24 hour  Intake             1080 ml  Output             1450 ml  Net             -370 ml   Filed Weights   10/19/16 1811 10/19/16 2258 10/20/16 0340  Weight: 81.6 kg (180 lb) 86.2 kg (190 lb) 85.5 kg (188 lb 9.6 oz)    Examination:  General exam: Appears calm and comfortable  Respiratory system: diminished breath sounds at right base. Respiratory effort normal. Cardiovascular system: S1 & S2 heard, RRR. No JVD, murmurs, rubs,  gallops or clicks. 1-2+ pedal edema. Gastrointestinal system: Abdomen is nondistended, soft and nontender. No organomegaly or masses felt. Normal bowel sounds heard. Central nervous system: No focal neurological deficits. Extremities: Symmetric 5 x 5 power. Skin: No rashes, lesions or ulcers Psychiatry: awake and alert, cooperative     Data Reviewed: I have personally reviewed following labs and imaging studies  CBC:  Recent Labs Lab 10/19/16 1833  WBC 7.0  NEUTROABS 4.8  HGB 12.4  HCT 38.6  MCV 88.9  PLT 317    Basic Metabolic Panel:  Recent Labs Lab 10/19/16 1833 10/20/16 1027 10/21/16 0548  NA 139 138 137  K 4.0 3.2* 3.0*  CL 101 97* 98*  CO2 28 33* 32  GLUCOSE 103* 115* 96  BUN 19 16 16   CREATININE 0.87 0.86 0.80  CALCIUM 8.8* 8.4* 8.4*   GFR: Estimated Creatinine Clearance: 51.2 mL/min (by C-G formula based on SCr of 0.8 mg/dL). Liver Function Tests:  Recent Labs Lab 10/19/16 1833  AST 46*  ALT 90*  ALKPHOS 72  BILITOT 0.5  PROT 6.8  ALBUMIN 3.4*   No results for input(s): LIPASE, AMYLASE in the last 168 hours. No results for input(s): AMMONIA in the last 168 hours. Coagulation Profile:  Recent Labs Lab 10/20/16 1027 10/21/16 0548  INR 3.00 2.47   Cardiac Enzymes:  Recent Labs Lab 10/19/16 1833  TROPONINI <0.03   BNP (last 3 results) No results for input(s): PROBNP in the last 8760 hours. HbA1C: No results for input(s): HGBA1C in the last 72 hours. CBG: No results for input(s): GLUCAP in the last 168 hours. Lipid Profile: No results for input(s): CHOL, HDL, LDLCALC, TRIG, CHOLHDL, LDLDIRECT in the last 72 hours. Thyroid Function Tests:  Recent Labs  10/19/16 1833  TSH 2.014   Anemia Panel: No results for input(s): VITAMINB12, FOLATE, FERRITIN, TIBC, IRON, RETICCTPCT in the last 72 hours. Sepsis Labs: No results for input(s): PROCALCITON, LATICACIDVEN in the last 168 hours.  No results found for this or any previous visit (from the past 240 hour(s)).       Radiology Studies: Dg Chest 2 View  Result Date: 10/19/2016 CLINICAL DATA:  Shortness of breath EXAM: CHEST  2 VIEW COMPARISON:  Jul 16, 2016 FINDINGS: There is a moderate right pleural effusion which is larger in the interval with underlying opacity. Mild pulmonary venous congestion without overt edema. There is a compression fracture in a lower thoracic vertebral body which is new when compared to May of 2018. No other acute abnormalities. IMPRESSION: 1. There is a compression fracture  of a lower thoracic vertebral body which is new compared to May 2018. Recommend clinical correlation for pain in this region. MRI could further assess acuity if clinically warranted. 2. The moderate right pleural effusion is larger in the interval. Mild pulmonary venous congestion. Electronically Signed   By: Gerome Sam III M.D   On: 10/19/2016 19:35   Ct Head Wo Contrast  Result Date: 10/20/2016 CLINICAL DATA:  Shortness of breath, altered level consciousness. Decompensated CHF, atrial fibrillation on Coumadin. EXAM: CT HEAD WITHOUT CONTRAST TECHNIQUE: Contiguous axial images were obtained from the base of the skull through the vertex without intravenous contrast. COMPARISON:  CT HEAD Jul 16, 2016 FINDINGS: Moderately motion degraded examination. BRAIN: No intraparenchymal hemorrhage, mass effect nor midline shift. Moderate ventriculomegaly with mild sulcal effacement at the convexities. Confluent supratentorial white matter hypodensities, worse within RIGHT juxta cortical 2D white matter. Focal blurring of the RIGHT frontal gray-white matter. No abnormal  extra-axial fluid collections. Basal cisterns are patent. VASCULAR: Mild calcific atherosclerosis of the carotid siphons. SKULL: No skull fracture. No significant scalp soft tissue swelling. SINUSES/ORBITS: The mastoid air-cells and included paranasal sinuses are well-aerated.The included ocular globes and orbital contents are non-suspicious. Brass post bilateral ocular lens implants. OTHER: None. IMPRESSION: 1. Moderately motion degraded examination. 2. Acute to subacute small RIGHT frontal lobe/ MCA territories suspected infarct. 3. Worsening moderate to severe chronic small vessel ischemic disease. 4. Chronic mild suspected normal pressure hydrocephalus. Electronically Signed   By: Awilda Metro M.D.   On: 10/20/2016 17:46        Scheduled Meds: . albuterol  2.5 mg Nebulization TID  . flecainide  100 mg Oral BID  . furosemide  60 mg  Intravenous BID  . metoprolol succinate  50 mg Oral BID  . potassium chloride  10 mEq Oral Once per day on Sun Tue Thu Sat  . potassium chloride  20 mEq Oral Q M,W,F  . potassium chloride  40 mEq Oral Once  . potassium chloride  40 mEq Oral Q3H  . traZODone  100 mg Oral QHS   Continuous Infusions:   LOS: 2 days    Time spent:    MEMON,JEHANZEB, MD [facility] Pager [phone]  If 7PM-7AM, please contact night-coverage [email or web address] Password [facility] 10/21/2016, 6:42 PM

## 2016-10-21 NOTE — Care Management Note (Signed)
Case Management Note  Patient Details  Name: Danielle Brooks MRN: [id number] Date of Birth: Dec 17, 1928  Subjective/Objective:                  Admitted with CHF. Pt is from home, she is conversing appropriately this morning but has periods of confusion, asking if she is going back to [facility] tonight but meaning going home tonight. Pt lives with her husband, per pt is he is very involved in caring for her. She is able to bath herself with husbands stand-by assistance. She uses RW with ambulation. She has BSC. She has scale. She does not have oxygen but on it acutely now. Pt wants oxygen when she goes home. She is active with [facility] for RN and PT. Per RN pt reports she needs STR.   Action/Plan: PT eval pending. CM explained qualifying criteria for home oxygen. Pt will need home O2 assessment if DCing home. [facility] rep, Maralyn Sago, aware of admission to hospital. CM will cont to follow.   Expected Discharge Date:  10/22/16               Expected Discharge Plan:  Skilled Nursing Facility  Discharge planning Services  CM Consult  Status of Service:  In process, will continue to follow  Malcolm Metro, RN 10/21/2016, 10:36 AM

## 2016-10-21 NOTE — Progress Notes (Signed)
ANTICOAGULATION CONSULT NOTE  Pharmacy Consult for Warfarin Indication: atrial fibrillation  Allergies  Allergen Reactions  . Bactrim [Sulfamethoxazole-Trimethoprim] Rash  . Codeine Itching  . Novocain [Procaine Hcl] Other (See Comments)    Sob and passed out years ago at dentist office   Patient Measurements: Height: 5\' 2"  (157.5 cm) Weight: 188 lb 9.6 oz (85.5 kg) IBW/kg (Calculated) : 50.1 HEPARIN DW (KG): 69.7   Vital Signs: Temp: 98.4 F (36.9 C) (08/23 0631) Temp Source: Oral (08/23 0631) BP: 144/61 (08/23 0631) Pulse Rate: 60 (08/23 0631)  Labs:  Recent Labs  10/19/16 1833 10/20/16 1027 10/21/16 0548  HGB 12.4  --   --   HCT 38.6  --   --   PLT 317  --   --   LABPROT  --  31.8* 27.2*  INR  --  3.00 2.47  CREATININE 0.87 0.86 0.80  TROPONINI <0.03  --   --     Estimated Creatinine Clearance: 51.2 mL/min (by C-G formula based on SCr of 0.8 mg/dL).   Medical History: Past Medical History:  Diagnosis Date  . Atrial fibrillation (HCC)    Paroxysmal, history of normal LV and negative ischemic workup  . Mixed hyperlipidemia   . Osteoporosis   . TIA (transient ischemic attack)   . Vitamin D deficiency     Medications:  Prescriptions Prior to Admission  Medication Sig Dispense Refill Last Dose  . flecainide (TAMBOCOR) 100 MG tablet Take 1 tablet (100 mg total) by mouth 2 (two) times daily. 65 tablet 3 10/19/2016 at Unknown time  . furosemide (LASIX) 20 MG tablet Take 2 tabs (40mg ) on Monday Wednesday Friday alternating with 20 mg 1 tab (Patient taking differently: Take 20-40 mg by mouth daily. Patient changed directions to one daily (Take 2 tabs (40mg ) on Monday Wednesday Friday alternating with 20 mg (1 tab) Tuesday, Thursday, Saturday and Sunday.) 180 tablet 3 10/19/2016 at Unknown time  . metoprolol succinate (TOPROL-XL) 50 MG 24 hr tablet take 1 tablet by mouth twice a day 60 tablet 0 10/19/2016 at 800a  . potassium chloride (K-DUR) 10 MEQ tablet Take 10-20  mEq by mouth daily. With lasix:Patient changed directions to one daily (Take 2 tabs (20mg ) on Monday Wednesday Friday alternating with 10 mg 1 tab on Tuesday, Thursday, Saturday, Sunday)  0 10/19/2016 at Unknown time  . traZODone (DESYREL) 100 MG tablet take 1/2 tablet by mouth at bedtime **MAY INCREASE TO 1 TABLET AT BEDTIME IF NEEDED** (Patient taking differently: Take 100 mg by mouth at bedtime. ) 30 tablet 2 10/18/2016 at Unknown time  . Vitamin D, Ergocalciferol, (DRISDOL) 50000 UNITS CAPS Take 50,000 Units by mouth every Tuesday.    10/19/2016 at Unknown time  . warfarin (COUMADIN) 5 MG tablet Take 5 mg by mouth every evening. Managed by [facility]   10/18/2016 at 1700   Assessment: Okay for Protocol, INR 2.47.  Last dose 8/20 per home med list. Dose not given on 8/22 due to increased somnolence, unable to take PO.  Goal of Therapy:  INR 2-3   Plan:  Warfarin 2.5mg  PO x 1. Daily PT/INR Monitor for signs and symptoms of bleeding.   Mady Gemma 10/21/2016,10:09 AM

## 2016-10-21 NOTE — Evaluation (Signed)
Physical Therapy Evaluation Patient Details Name: Danielle Brooks MRN: [id number] DOB: 1928-06-21 Today's Date: 10/21/2016   History of Present Illness  77 o female with onset of AMS adn CHF with R pleural effusion noted on imaging, BLE edema and R frontal and acute subacute infarct.  PMHx:  throacic compression fractures, recent sternal fracture, osteoporosis, a-fib, CHF,   Clinical Impression  Pt is demonstrating issues of confusion and safety including needing close supervised and physically assisted transfers and giat.  Her husband intends to take her home but safety will need to be a priority including assisting her every time she walks.  Will follow acutely for gait and strengthening, and still recommend her to SNF due to the level of assist she needs and O2 sats are dropping from 97% to 92% on O2 supplementation for short walks in hosp room.    Follow Up Recommendations SNF    Equipment Recommendations  None recommended by PT    Recommendations for Other Services       Precautions / Restrictions Precautions Precautions: Fall;Back Precaution Booklet Issued: No Precaution Comments: thoracic compression fractures Required Braces or Orthoses:  (no brace issued for the compression fractures) Restrictions Weight Bearing Restrictions: No      Mobility  Bed Mobility Overal bed mobility: Needs Assistance Bed Mobility: Supine to Sit;Sit to Supine     Supine to sit: Min assist Sit to supine: Min assist   General bed mobility comments: mainly needs help to manage trunk sitting and to lift legs in getting supine  Transfers Overall transfer level: Needs assistance Equipment used: Rolling walker (2 wheeled);1 person hand held assist Transfers: Sit to/from Stand Sit to Stand: Mod assist         General transfer comment: consistent help to power up and to control initial standign  Ambulation/Gait Ambulation/Gait assistance: Min assist Ambulation Distance (Feet): 28 Feet (x  2) Assistive device: Rolling walker (2 wheeled) Gait Pattern/deviations: Step-through pattern;Shuffle;Decreased stride length;Wide base of support;Trunk flexed Gait velocity: reduced Gait velocity interpretation: Below normal speed for age/gender General Gait Details: pt is needing 100% cues for direction of walker and does not self monitor for safety  Stairs            Wheelchair Mobility    Modified Rankin (Stroke Patients Only)       Balance Overall balance assessment: History of Falls;Needs assistance Sitting-balance support: Feet supported Sitting balance-Leahy Scale: Fair     Standing balance support: Bilateral upper extremity supported Standing balance-Leahy Scale: Poor                               Pertinent Vitals/Pain Pain Assessment: Faces Faces Pain Scale: Hurts little more Pain Location: general and back Pain Intervention(s): Limited activity within patient's tolerance;Repositioned    Home Living Family/patient expects to be discharged to:: Private residence Living Arrangements: Spouse/significant other Available Help at Discharge: Family;Available 24 hours/day Type of Home: House       Home Layout: One level Home Equipment: Walker - 2 wheels Additional Comments: was just in SNF for rehab prior to admission    Prior Function Level of Independence: Needs assistance   Gait / Transfers Assistance Needed: was in SNF for gait assistance with RW  ADL's / Homemaking Assistance Needed: was in SNF for all these services including bathing assistance        Hand Dominance        Extremity/Trunk Assessment   Upper  Extremity Assessment Upper Extremity Assessment: Generalized weakness    Lower Extremity Assessment Lower Extremity Assessment: Generalized weakness    Cervical / Trunk Assessment Cervical / Trunk Assessment: Kyphotic  Communication   Communication: No difficulties  Cognition Arousal/Alertness: Awake/alert Behavior  During Therapy: Impulsive Overall Cognitive Status: Impaired/Different from baseline Area of Impairment: Orientation;Attention;Memory;Following commands;Safety/judgement;Awareness;Problem solving                 Orientation Level: Situation Current Attention Level: Selective Memory: Decreased short-term memory Following Commands: Follows one step commands inconsistently Safety/Judgement: Decreased awareness of safety;Decreased awareness of deficits Awareness: Intellectual Problem Solving: Slow processing;Difficulty sequencing;Requires verbal cues        General Comments      Exercises     Assessment/Plan    PT Assessment Patient needs continued PT services  PT Problem List Decreased strength;Decreased range of motion;Decreased activity tolerance;Decreased balance;Decreased mobility;Decreased coordination;Decreased cognition;Decreased knowledge of use of DME;Decreased safety awareness;Decreased knowledge of precautions;Cardiopulmonary status limiting activity;Obesity       PT Treatment Interventions DME instruction;Gait training;Functional mobility training;Therapeutic activities;Therapeutic exercise;Balance training;Neuromuscular re-education;Patient/family education    PT Goals (Current goals can be found in the Care Plan section)  Acute Rehab PT Goals Patient Stated Goal: to go directly home as she didnt like the nursing home PT Goal Formulation: With patient/family Time For Goal Achievement: 11/04/16 Potential to Achieve Goals: Good    Frequency Min 3X/week   Barriers to discharge Decreased caregiver support husband is elderly and will need to assist her on every trial of walking    Co-evaluation               AM-PAC PT "6 Clicks" Daily Activity  Outcome Measure Difficulty turning over in bed (including adjusting bedclothes, sheets and blankets)?: Unable Difficulty moving from lying on back to sitting on the side of the bed? : Unable Difficulty sitting  down on and standing up from a chair with arms (e.g., wheelchair, bedside commode, etc,.)?: Unable Help needed moving to and from a bed to chair (including a wheelchair)?: A Little Help needed walking in hospital room?: A Little Help needed climbing 3-5 steps with a railing? : A Lot 6 Click Score: 11    End of Session Equipment Utilized During Treatment: Gait belt;Oxygen Activity Tolerance: Patient limited by fatigue;Treatment limited secondary to medical complications (Comment) (back precautions) Patient left: in bed;with call bell/phone within reach;with family/visitor present;Other (comment) (doctor had stepped in to see her) Nurse Communication: Mobility status PT Visit Diagnosis: Unsteadiness on feet (R26.81);History of falling (Z91.81);Muscle weakness (generalized) (M62.81);Other abnormalities of gait and mobility (R26.89)    Time: 4098-1191 (+ 1309 to 1335= 38) PT Time Calculation (min) (ACUTE ONLY): 11 min   Charges:   PT Evaluation $PT Eval Moderate Complexity: 1 Mod PT Treatments $Gait Training: 8-22 mins $Therapeutic Exercise: 8-22 mins   PT G Codes:   PT G-Codes **NOT FOR INPATIENT CLASS** Functional Assessment Tool Used: AM-PAC 6 Clicks Basic Mobility    Ivar Drape 10/21/2016, 2:30 PM   2:32 PM, 10/21/16 Samul Dada, PT, MS Physical Therapist - [place] [phone] (Office)

## 2016-10-22 ENCOUNTER — Inpatient Hospital Stay (HOSPITAL_COMMUNITY)
Admit: 2016-10-22 | Discharge: 2016-10-22 | Disposition: A | Discharge status: Home / Self Care | Payer: Medicare Other | Attending: Neurology | Admitting: Neurology

## 2016-10-22 ENCOUNTER — Inpatient Hospital Stay (HOSPITAL_COMMUNITY): Payer: Medicare Other

## 2016-10-22 DIAGNOSIS — I639 Cerebral infarction, unspecified: Secondary | ICD-10-CM

## 2016-10-22 LAB — CBC
HEMATOCRIT: 38.9 % (ref 36.0–46.0)
HEMOGLOBIN: 12.5 g/dL (ref 12.0–15.0)
MCH: 28.4 pg (ref 26.0–34.0)
MCHC: 32.1 g/dL (ref 30.0–36.0)
MCV: 88.4 fL (ref 78.0–100.0)
Platelets: 289 10*3/uL (ref 150–400)
RBC: 4.4 MIL/uL (ref 3.87–5.11)
RDW: 14.3 % (ref 11.5–15.5)
WBC: 9.2 10*3/uL (ref 4.0–10.5)

## 2016-10-22 LAB — BASIC METABOLIC PANEL
Anion gap: 8 (ref 5–15)
BUN: 16 mg/dL (ref 6–20)
CO2: 37 mmol/L — ABNORMAL HIGH (ref 22–32)
CREATININE: 0.96 mg/dL (ref 0.44–1.00)
Calcium: 8.9 mg/dL (ref 8.9–10.3)
Chloride: 94 mmol/L — ABNORMAL LOW (ref 101–111)
GFR calc Af Amer: >60 mL/min (ref >60)
GFR, EST NON AFRICAN AMERICAN: 52 mL/min — AB (ref >60)
GLUCOSE: 96 mg/dL (ref 65–99)
Potassium: 4 mmol/L (ref 3.5–5.1)
SODIUM: 139 mmol/L (ref 135–145)

## 2016-10-22 LAB — PROTIME-INR
INR: 1.75
PROTHROMBIN TIME: 20.7 s — AB (ref 11.4–15.2)

## 2016-10-22 LAB — MAGNESIUM: Magnesium: 1.7 mg/dL (ref 1.7–2.4)

## 2016-10-22 LAB — HEMOGLOBIN A1C
Hgb A1c MFr Bld: 5 % (ref 4.8–5.6)
MEAN PLASMA GLUCOSE: 96.8 mg/dL

## 2016-10-22 LAB — LIPID PANEL
CHOL/HDL RATIO: 3.3 ratio
CHOLESTEROL: 128 mg/dL (ref 0–200)
HDL: 39 mg/dL — AB (ref >40)
LDL CALC: 72 mg/dL (ref 0–99)
TRIGLYCERIDES: 85 mg/dL (ref <150)
VLDL: 17 mg/dL (ref 0–40)

## 2016-10-22 LAB — VITAMIN B12: Vitamin B-12: 400 pg/mL (ref 180–914)

## 2016-10-22 MED ORDER — WARFARIN SODIUM 2.5 MG PO TABS
2.5000 mg | ORAL_TABLET | Freq: Once | ORAL | Status: AC
  Administered 2016-10-22: 2.5 mg via ORAL
  Filled 2016-10-22: qty 1

## 2016-10-22 MED ORDER — METOLAZONE 5 MG PO TABS
5.0000 mg | ORAL_TABLET | Freq: Once | ORAL | Status: AC
  Administered 2016-10-22: 5 mg via ORAL
  Filled 2016-10-22: qty 1

## 2016-10-22 MED ORDER — WARFARIN - PHARMACIST DOSING INPATIENT
Freq: Every day | Status: DC
  Administered 2016-10-22: 16:00:00

## 2016-10-22 NOTE — Progress Notes (Signed)
ANTICOAGULATION CONSULT NOTE  Pharmacy Consult for Warfarin Indication: atrial fibrillation  Allergies  Allergen Reactions  . Bactrim [Sulfamethoxazole-Trimethoprim] Rash  . Codeine Itching  . Novocain [Procaine Hcl] Other (See Comments)    Sob and passed out years ago at dentist office   Patient Measurements: Height: 5\' 2"  (157.5 cm) Weight: 188 lb 9.6 oz (85.5 kg) IBW/kg (Calculated) : 50.1 HEPARIN DW (KG): 69.7   Vital Signs: Temp: 98.5 F (36.9 C) (08/24 0930) Temp Source: Oral (08/24 0930) BP: 129/66 (08/24 0930)  Labs:  Recent Labs  10/19/16 1833 10/20/16 1027 10/21/16 0548 10/22/16 0548  HGB 12.4  --   --  12.5  HCT 38.6  --   --  38.9  PLT 317  --   --  289  LABPROT  --  31.8* 27.2* 20.7*  INR  --  3.00 2.47 1.75  CREATININE 0.87 0.86 0.80 0.96  TROPONINI <0.03  --   --   --     Estimated Creatinine Clearance: 42.7 mL/min (by C-G formula based on SCr of 0.96 mg/dL).   Medical History: Past Medical History:  Diagnosis Date  . Atrial fibrillation (HCC)    Paroxysmal, history of normal LV and negative ischemic workup  . Mixed hyperlipidemia   . Osteoporosis   . TIA (transient ischemic attack)   . Vitamin D deficiency     Medications:  Prescriptions Prior to Admission  Medication Sig Dispense Refill Last Dose  . flecainide (TAMBOCOR) 100 MG tablet Take 1 tablet (100 mg total) by mouth 2 (two) times daily. 65 tablet 3 10/19/2016 at Unknown time  . furosemide (LASIX) 20 MG tablet Take 2 tabs (40mg ) on Monday Wednesday Friday alternating with 20 mg 1 tab (Patient taking differently: Take 20-40 mg by mouth daily. Patient changed directions to one daily (Take 2 tabs (40mg ) on Monday Wednesday Friday alternating with 20 mg (1 tab) Tuesday, Thursday, Saturday and Sunday.) 180 tablet 3 10/19/2016 at Unknown time  . metoprolol succinate (TOPROL-XL) 50 MG 24 hr tablet take 1 tablet by mouth twice a day 60 tablet 0 10/19/2016 at 800a  . potassium chloride (K-DUR) 10  MEQ tablet Take 10-20 mEq by mouth daily. With lasix:Patient changed directions to one daily (Take 2 tabs (20mg ) on Monday Wednesday Friday alternating with 10 mg 1 tab on Tuesday, Thursday, Saturday, Sunday)  0 10/19/2016 at Unknown time  . traZODone (DESYREL) 100 MG tablet take 1/2 tablet by mouth at bedtime **MAY INCREASE TO 1 TABLET AT BEDTIME IF NEEDED** (Patient taking differently: Take 100 mg by mouth at bedtime. ) 30 tablet 2 10/18/2016 at Unknown time  . Vitamin D, Ergocalciferol, (DRISDOL) 50000 UNITS CAPS Take 50,000 Units by mouth every Tuesday.    10/19/2016 at Unknown time  . warfarin (COUMADIN) 5 MG tablet Take 5 mg by mouth every evening. Managed by [facility]   10/18/2016 at 1700   Assessment: Okay for Protocol.  Last dose 8/20 per home med list. Dose not given on 8/22 due to increased somnolence, unable to take PO.  INR today is 1.75. Coumadin dose not restarted 8/23, now to be restarted today.   Goal of Therapy:  INR 2-3   Plan:  Warfarin 2.5mg  PO x 1. Daily PT/INR Monitor for signs and symptoms of bleeding.   Elder Cyphers, BS Loura Back, BCPS Clinical Pharmacist Pager [phone] 10/22/2016,11:31 AM

## 2016-10-22 NOTE — Evaluation (Signed)
Occupational Therapy Evaluation Patient Details Name: Danielle Brooks MRN: [id number] DOB: May 23, 1928 Today's Date: 10/22/2016    History of Present Illness 41 o female with onset of AMS adn CHF with R pleural effusion noted on imaging, BLE edema and R frontal and acute subacute infarct.  PMHx:  throacic compression fractures, recent sternal fracture, osteoporosis, a-fib, CHF,    Clinical Impression   Pt received supine in bed, agreeable to OT evaluation, husband present for first half of evaluation. Pt reports she is feeling better, oriented to person, place, time, and situation. Pt demonstrates improved bed mobility requiring supervision for managing catheter. During evaluation pt demonstrate generalized weakness and fatigue, requiring rest breaks intermittently. Pt and husband report he assists pt with all ADL tasks as needed. Pt is planning to return home with resumption of HH RN and PT services, recommend HHOT to evaluate pt in home environment and determine if further services would be beneficial. At this time no further acute OT services are required.     Follow Up Recommendations  Home health OT;Supervision/Assistance - 24 hour    Equipment Recommendations  None recommended by OT       Precautions / Restrictions Precautions Precautions: Fall;Back Precaution Comments: thoracic compression fractures Restrictions Weight Bearing Restrictions: No      Mobility Bed Mobility Overal bed mobility: Needs Assistance Bed Mobility: Supine to Sit;Sit to Supine     Supine to sit: Supervision Sit to supine: Supervision      Transfers Overall transfer level: Needs assistance Equipment used: Rolling walker (2 wheeled) Transfers: Sit to/from Stand Sit to Stand: Mod assist                  ADL either performed or assessed with clinical judgement   ADL Overall ADL's : Needs assistance/impaired     Grooming: Set up;Sitting   Upper Body Bathing: Minimal  assistance;Sitting Upper Body Bathing Details (indicate cue type and reason): Assist for reaching back Lower Body Bathing: Minimal assistance;Sitting/lateral leans Lower Body Bathing Details (indicate cue type and reason): Assist to reach feet Upper Body Dressing : Supervision/safety;Sitting   Lower Body Dressing: Supervision/safety;Sitting/lateral leans   Toilet Transfer: Min guard;RW           Functional mobility during ADLs: Min guard;Minimal assistance;Rolling walker       Vision Baseline Vision/History: Wears glasses Wears Glasses: At all times Patient Visual Report: No change from baseline Vision Assessment?: No apparent visual deficits            Pertinent Vitals/Pain Pain Assessment: No/denies pain     Hand Dominance Right   Extremity/Trunk Assessment Upper Extremity Assessment Upper Extremity Assessment: Overall WFL for tasks assessed   Lower Extremity Assessment Lower Extremity Assessment: Defer to PT evaluation   Cervical / Trunk Assessment Cervical / Trunk Assessment: Kyphotic   Communication Communication Communication: No difficulties   Cognition Arousal/Alertness: Awake/alert Behavior During Therapy: WFL for tasks assessed/performed Overall Cognitive Status: No family/caregiver present to determine baseline cognitive functioning                                                Home Living Family/patient expects to be discharged to:: Private residence Living Arrangements: Spouse/significant other Available Help at Discharge: Family;Available 24 hours/day Type of Home: House Home Access: Level entry     Home Layout: One level  Bathroom Shower/Tub: Chief Strategy Officer: Standard     Home Equipment: Environmental consultant - 2 wheels;Bedside commode;Tub bench;Grab bars - tub/shower;Wheelchair - manual          Prior Functioning/Environment Level of Independence: Needs assistance  Gait / Transfers Assistance Needed:  Pt uses RW for ambulation ADL's / Homemaking Assistance Needed: Pt reports she is able to complete ADL tasks, husband assists as needed with dressing and bathing            OT Problem List: Decreased activity tolerance;Impaired balance (sitting and/or standing);Decreased safety awareness       AM-PAC PT "6 Clicks" Daily Activity     Outcome Measure Help from another person eating meals?: None Help from another person taking care of personal grooming?: A Little Help from another person toileting, which includes using toliet, bedpan, or urinal?: A Little Help from another person bathing (including washing, rinsing, drying)?: A Lot Help from another person to put on and taking off regular upper body clothing?: A Little Help from another person to put on and taking off regular lower body clothing?: A Little 6 Click Score: 18   End of Session Equipment Utilized During Treatment: Gait belt;Rolling walker  Activity Tolerance: Patient tolerated treatment well Patient left: in bed;with call bell/phone within reach;with bed alarm set  OT Visit Diagnosis: Muscle weakness (generalized) (M62.81)                Time: 0932-3557 OT Time Calculation (min): 45 min Charges:  OT General Charges $OT Visit: 1 Procedure OT Evaluation $OT Eval Low Complexity: 1 Procedure   Ezra Sites, OTR/L  [phone] 10/22/2016, 3:55 PM

## 2016-10-22 NOTE — Progress Notes (Signed)
Physical Therapy Treatment Patient Details Name: Danielle Brooks MRN: [id number] DOB: [birth year] Today's Date: 10/22/2016    History of Present Illness 89 o female with onset of AMS adn CHF with R pleural effusion noted on imaging, BLE edema and R frontal and acute subacute infarct.  PMHx:  throacic compression fractures, recent sternal fracture, osteoporosis, a-fib, CHF,     PT Comments    Pt is able to participate in limited therapy and was too sleepy to fully answer questions for the rounding nurse.  Her MD has ordered an EEG and will await the information to see what changes may be going on.  Follow pt acutely to see how her progress with mobility goes, and still expecting her to need SNF as she is so weak and limited for mobility.   Follow Up Recommendations  SNF     Equipment Recommendations  None recommended by PT    Recommendations for Other Services       Precautions / Restrictions Precautions Precautions: Fall;Back Precaution Booklet Issued: No Precaution Comments: thoracic compression fractures Restrictions Weight Bearing Restrictions: No    Mobility  Bed Mobility Overal bed mobility: Needs Assistance Bed Mobility: Supine to Sit;Sit to Supine     Supine to sit: Supervision Sit to supine: Supervision   General bed mobility comments: declined  Transfers Overall transfer level: Needs assistance Equipment used: Rolling walker (2 wheeled) Transfers: Sit to/from Stand Sit to Stand: Mod assist         General transfer comment: declined  Ambulation/Gait                 Stairs            Wheelchair Mobility    Modified Rankin (Stroke Patients Only)       Balance                                            Cognition Arousal/Alertness: Lethargic Behavior During Therapy: Flat affect Overall Cognitive Status: No family/caregiver present to determine baseline cognitive functioning Area of Impairment: Following  commands;Awareness;Problem solving;Attention                   Current Attention Level: Selective Memory: Decreased recall of precautions;Decreased short-term memory Following Commands: Follows one step commands with increased time Safety/Judgement: Decreased awareness of safety;Decreased awareness of deficits Awareness: Intellectual Problem Solving: Slow processing;Decreased initiation;Difficulty sequencing;Requires verbal cues;Requires tactile cues General Comments: Pt is more capable of moving than she presents today, but did talk with her about trying to walk.  Declined and asked for weekend therapy which wont be scheduled for her.  Agreed to bed exercise and then was too sleepy to answer questions from rounding nurse      Exercises General Exercises - Lower Extremity Ankle Circles/Pumps: PROM;Both;5 reps Quad Sets: 5 reps Heel Slides: PROM;AAROM;Both;10 reps Hip ABduction/ADduction: PROM;AAROM;Both;10 reps Hip Flexion/Marching: PROM;AAROM;Both;10 reps    General Comments        Pertinent Vitals/Pain Pain Assessment: No/denies pain    Home Living Family/patient expects to be discharged to:: Private residence Living Arrangements: Spouse/significant other Available Help at Discharge: Family;Available 24 hours/day Type of Home: House Home Access: Level entry   Home Layout: One level Home Equipment: Walker - 2 wheels;Bedside commode;Tub bench;Grab bars - tub/shower;Wheelchair - manual      Prior Function Level of Independence: Needs assistance  Gait / Transfers  Assistance Needed: Pt uses RW for ambulation ADL's / Homemaking Assistance Needed: Pt reports she is able to complete ADL tasks, husband assists as needed with dressing and bathing     PT Goals (current goals can now be found in the care plan section) Acute Rehab PT Goals Patient Stated Goal: to wait for the weekend to move Progress towards PT goals: Progressing toward goals    Frequency    Min  3X/week      PT Plan Current plan remains appropriate    Co-evaluation              AM-PAC PT "6 Clicks" Daily Activity  Outcome Measure  Difficulty turning over in bed (including adjusting bedclothes, sheets and blankets)?: Unable Difficulty moving from lying on back to sitting on the side of the bed? : Unable Difficulty sitting down on and standing up from a chair with arms (e.g., wheelchair, bedside commode, etc,.)?: Unable Help needed moving to and from a bed to chair (including a wheelchair)?: A Lot Help needed walking in hospital room?: A Lot Help needed climbing 3-5 steps with a railing? : Total 6 Click Score: 8    End of Session Equipment Utilized During Treatment: Oxygen Activity Tolerance: Patient limited by fatigue;Patient limited by lethargy;Other (comment) (back precautions) Patient left: in bed;with call bell/phone within reach;with bed alarm set;with nursing/sitter in room Nurse Communication: Mobility status PT Visit Diagnosis: Unsteadiness on feet (R26.81);History of falling (Z91.81);Muscle weakness (generalized) (M62.81);Other abnormalities of gait and mobility (R26.89)     Time: 6333-5456 PT Time Calculation (min) (ACUTE ONLY): 19 min  Charges:  $Therapeutic Exercise: 8-22 mins                    G Codes:  Functional Assessment Tool Used: AM-PAC 6 Clicks Basic Mobility     Ivar Drape 10/22/2016, 6:16 PM   6:20 PM, 10/22/16 Samul Dada, PT, MS Physical Therapist - [place] [phone] (Office)

## 2016-10-22 NOTE — Progress Notes (Signed)
PROGRESS NOTE    Danielle Brooks  ZOX:[id number] DOB: 07-Mar-1928 DOA: 10/19/2016 PCP: Ignatius Specking, MD    Brief Narrative:  83 year old female with a history of atrial fibrillation on Coumadin, diastolic CHF, was sent to the hospital by her cardiologist for shortness of breath. She had worsening pedal edema and had evidence of decompensated CHF. She is admitted for further diuresis.   Assessment & Plan:   Principal Problem:   CHF, acute on chronic [facility]) Active Problems:   Paroxysmal atrial fibrillation (HCC)   Essential hypertension   1. Acute on chronic CHF, suspect diastolic . Echo shows preserved EF with restrictive pattern. Cardiology following. volume status -4.1L since admission. Continue lasix to 60mg  BID. Monitor intake and output. 2. Paroxysmal atrial fibrillation. Rate is controlled. Continue on flecainide and beta blockers. She is anticoagulated with Coumadin. 3. COPD . continue bronchodilators. No evidence of wheezing today. 4. Right pleural effusion. Suspect is related to CHF. Continue to monitor with diuresis. If does not improve, may need thoracentesis. 5. Hypertension. Blood pressures controlled. Continue current medications. 6. Remote CVA. Patient was noted to be confused 8/22. CT head indicated acute/subacute infarct in right frontal/MCA distribution. MRI of the brain showed encephalomalacia of right frontal lobe without any acute infarcts. Seen by neurology and EEG has been ordered to rule out seizures. It is possible that her change in mental status was related to medications vs. Acute illness and hypoxia.   DVT prophylaxis: Coumadin Code Status: Full code Family Communication: discussed with husband in the room Disposition Plan: will need SNF placement on discharge   Consultants:   Cardiology  Neurology  Procedures:  Echo: - Normal LV wall thickness with LVEF 55-60% and restrictive   diastolic filling pattern. Mild left atrial enlargement.   Moderately  calcified mitral annulus with mild mitral   regurgitation. Moderately sclerotic to mildly stenotic aortic   valve with moderate aortic regurgitation. Mild dilated right   ventricle with mildly reduced contraction. Mild right atrial   enlargement. Mild tricuspid regurgitation with evidence of severe   pulmonary hypertension, estimated PASP 66 mmHg. Right pleural   effusion is noted.    Antimicrobials:      Subjective: Shortness of breath improving. No chest pain  Objective: Vitals:   10/22/16 1230 10/22/16 1344 10/22/16 1430 10/22/16 1500  BP: (!) 125/47  122/63 127/69  Pulse: (!) 54  (!) 59 62  Resp: 18  19 20   Temp: 98.6 F (37 C)  98.3 F (36.8 C) 98.5 F (36.9 C)  TempSrc: Oral  Oral Oral  SpO2: 97% 99% 98% 99%  Weight:      Height:        Intake/Output Summary (Last 24 hours) at 10/22/16 1707 Last data filed at 10/22/16 1300  Gross per 24 hour  Intake              480 ml  Output             4100 ml  Net            -3620 ml   Filed Weights   10/19/16 2258 10/20/16 0340 10/22/16 1223  Weight: 86.2 kg (190 lb) 85.5 kg (188 lb 9.6 oz) 79.8 kg (176 lb)    Examination:  General exam: Appears calm and comfortable  Respiratory system: diminished breath sounds at right base. Respiratory effort normal. Cardiovascular system: S1 & S2 heard, irregular. murmurs, rubs, gallops or clicks. 1+ pedal edema. Gastrointestinal system: Abdomen is nondistended, soft and  nontender. No organomegaly or masses felt. Normal bowel sounds heard. Central nervous system: No focal neurological deficits. Extremities: Symmetric 5 x 5 power. Skin: No rashes, lesions or ulcers Psychiatry: awake and alert, cooperative     Data Reviewed: I have personally reviewed following labs and imaging studies  CBC:  Recent Labs Lab 10/19/16 1833 10/22/16 0548  WBC 7.0 9.2  NEUTROABS 4.8  --   HGB 12.4 12.5  HCT 38.6 38.9  MCV 88.9 88.4  PLT 317 289   Basic Metabolic Panel:  Recent  Labs Lab 10/19/16 1833 10/20/16 1027 10/21/16 0548 10/22/16 0548  NA 139 138 137 139  K 4.0 3.2* 3.0* 4.0  CL 101 97* 98* 94*  CO2 28 33* 32 37*  GLUCOSE 103* 115* 96 96  BUN 19 16 16 16   CREATININE 0.87 0.86 0.80 0.96  CALCIUM 8.8* 8.4* 8.4* 8.9  MG  --   --   --  1.7   GFR: Estimated Creatinine Clearance: 41.2 mL/min (by C-G formula based on SCr of 0.96 mg/dL). Liver Function Tests:  Recent Labs Lab 10/19/16 1833  AST 46*  ALT 90*  ALKPHOS 72  BILITOT 0.5  PROT 6.8  ALBUMIN 3.4*   No results for input(s): LIPASE, AMYLASE in the last 168 hours. No results for input(s): AMMONIA in the last 168 hours. Coagulation Profile:  Recent Labs Lab 10/20/16 1027 10/21/16 0548 10/22/16 0548  INR 3.00 2.47 1.75   Cardiac Enzymes:  Recent Labs Lab 10/19/16 1833  TROPONINI <0.03   BNP (last 3 results) No results for input(s): PROBNP in the last 8760 hours. HbA1C:  Recent Labs  10/22/16 0548  HGBA1C 5.0   CBG: No results for input(s): GLUCAP in the last 168 hours. Lipid Profile:  Recent Labs  10/22/16 0548  CHOL 128  HDL 39*  LDLCALC 72  TRIG 85  CHOLHDL 3.3   Thyroid Function Tests:  Recent Labs  10/19/16 1833  TSH 2.014   Anemia Panel: No results for input(s): VITAMINB12, FOLATE, FERRITIN, TIBC, IRON, RETICCTPCT in the last 72 hours. Sepsis Labs: No results for input(s): PROCALCITON, LATICACIDVEN in the last 168 hours.  No results found for this or any previous visit (from the past 240 hour(s)).       Radiology Studies: Ct Head Wo Contrast  Result Date: 10/20/2016 CLINICAL DATA:  Shortness of breath, altered level consciousness. Decompensated CHF, atrial fibrillation on Coumadin. EXAM: CT HEAD WITHOUT CONTRAST TECHNIQUE: Contiguous axial images were obtained from the base of the skull through the vertex without intravenous contrast. COMPARISON:  CT HEAD Jul 16, 2016 FINDINGS: Moderately motion degraded examination. BRAIN: No  intraparenchymal hemorrhage, mass effect nor midline shift. Moderate ventriculomegaly with mild sulcal effacement at the convexities. Confluent supratentorial white matter hypodensities, worse within RIGHT juxta cortical 2D white matter. Focal blurring of the RIGHT frontal gray-white matter. No abnormal extra-axial fluid collections. Basal cisterns are patent. VASCULAR: Mild calcific atherosclerosis of the carotid siphons. SKULL: No skull fracture. No significant scalp soft tissue swelling. SINUSES/ORBITS: The mastoid air-cells and included paranasal sinuses are well-aerated.The included ocular globes and orbital contents are non-suspicious. Brass post bilateral ocular lens implants. OTHER: None. IMPRESSION: 1. Moderately motion degraded examination. 2. Acute to subacute small RIGHT frontal lobe/ MCA territories suspected infarct. 3. Worsening moderate to severe chronic small vessel ischemic disease. 4. Chronic mild suspected normal pressure hydrocephalus. Electronically Signed   By: Awilda Metro M.D.   On: 10/20/2016 17:46   Mr Brain Wo Contrast  Result Date:  10/22/2016 CLINICAL DATA:  Altered mental status over the last 2 days. EXAM: MRI HEAD WITHOUT CONTRAST MRA HEAD WITHOUT CONTRAST TECHNIQUE: Multiplanar, multiecho pulse sequences of the brain and surrounding structures were obtained without intravenous contrast. Angiographic images of the head were obtained using MRA technique without contrast. COMPARISON:  Head CT 10/22/2016.  MRI 04/19/2006. FINDINGS: MRI HEAD FINDINGS Brain: Diffusion imaging does not show any acute or subacute infarction. There are chronic small-vessel ischemic changes of the pons. No focal cerebellar insult. Cerebral hemispheres show old small vessel infarctions in the thalami, basal ganglia and hemispheric deep white matter. Encephalomalacia in the right frontal deep white matter with mild involvement of the right insular region. No large vessel territory infarction. No mass  lesion, hemorrhage, hydrocephalus or extra-axial collection. Ventricular size is in proportion to the degree of brain atrophy. Vascular: Major vessels at the base of the brain show flow. Skull and upper cervical spine: Negative Sinuses/Orbits: Clear/normal Other: None significant MRA HEAD FINDINGS Considerable motion degradation. Both internal carotid arteries are widely patent through the skullbase and siphon regions. The anterior and middle cerebral vessels are patent bilaterally. Cannot evaluate accurately for stenotic disease given the degradation. Both vertebral arteries are patent to the basilar. The basilar appears widely patent. Major posterior circulation branch vessels are patent. Large posterior communicating arteries bilaterally. Detail not sufficient to evaluate for stenotic disease. IMPRESSION: No acute or reversible finding. Atrophy and chronic small-vessel ischemic changes. 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 Motion degraded MR angiogram. Major vessels show flow. No evidence of occlusion or severe proximal stenosis, though detail is limited by motion. Electronically Signed   By: Paulina Fusi M.D.   On: 10/22/2016 08:40   US Carotid Bilateral (at [facility] Only)  Result Date: 10/22/2016 CLINICAL DATA:  83 year old female with a history of stroke. Cardiovascular risk factors include hypertension, known prior stroke/TIA, coronary artery disease, hyperlipidemia. EXAM: BILATERAL CAROTID DUPLEX ULTRASOUND TECHNIQUE: Wallace Cullens scale imaging, color Doppler and duplex ultrasound were performed of bilateral carotid and vertebral arteries in the neck. COMPARISON:  No prior duplex FINDINGS: Criteria: Quantification of carotid stenosis is based on velocity parameters that correlate the residual internal carotid diameter with NASCET-based stenosis levels, using the diameter of the distal internal carotid lumen as the denominator for stenosis measurement. The following velocity measurements were obtained:  RIGHT ICA:  Systolic 87 cm/sec, Diastolic 17 cm/sec CCA:  87 cm/sec SYSTOLIC ICA/CCA RATIO:  1.0 ECA:  65 cm/sec LEFT ICA:  Systolic 73 cm/sec, Diastolic 13 cm/sec CCA:  65 cm/sec SYSTOLIC ICA/CCA RATIO:  1.1 ECA:  50 cm/sec Right Brachial SBP: Not acquired Left Brachial SBP: Not acquired RIGHT CAROTID ARTERY: No significant calcified disease of the right common carotid artery. Intermediate waveform maintained. Heterogeneous plaque without significant calcifications at the right carotid bifurcation. Low resistance waveform of the right ICA. Mild tortuosity RIGHT VERTEBRAL ARTERY: Antegrade flow with low resistance waveform. LEFT CAROTID ARTERY: No significant calcified disease of the left common carotid artery. Intermediate waveform maintained. Heterogeneous plaque at the left carotid bifurcation without significant calcifications. Low resistance waveform of the left ICA. Mild tortuosity LEFT VERTEBRAL ARTERY:  Antegrade flow with low resistance waveform. IMPRESSION: Color duplex indicates minimal heterogeneous plaque, with no hemodynamically significant stenosis by duplex criteria in the extracranial cerebrovascular circulation. Signed, Yvone Neu. Loreta Ave, DO [facility] Radiology Electronically Signed   By: Gilmer Mor D.O.   On: 10/22/2016 09:22   Mr Maxine Glenn Head Wo Contrast  Result Date: 10/22/2016 CLINICAL  DATA:  Altered mental status over the last 2 days. EXAM: MRI HEAD WITHOUT CONTRAST MRA HEAD WITHOUT CONTRAST TECHNIQUE: Multiplanar, multiecho pulse sequences of the brain and surrounding structures were obtained without intravenous contrast. Angiographic images of the head were obtained using MRA technique without contrast. COMPARISON:  Head CT 10/22/2016.  MRI 04/19/2006. FINDINGS: MRI HEAD FINDINGS Brain: Diffusion imaging does not show any acute or subacute infarction. There are chronic small-vessel ischemic changes of the pons. No focal cerebellar insult.  Cerebral hemispheres show old small vessel infarctions in the thalami, basal ganglia and hemispheric deep white matter. Encephalomalacia in the right frontal deep white matter with mild involvement of the right insular region. No large vessel territory infarction. No mass lesion, hemorrhage, hydrocephalus or extra-axial collection. Ventricular size is in proportion to the degree of brain atrophy. Vascular: Major vessels at the base of the brain show flow. Skull and upper cervical spine: Negative Sinuses/Orbits: Clear/normal Other: None significant MRA HEAD FINDINGS Considerable motion degradation. Both internal carotid arteries are widely patent through the skullbase and siphon regions. The anterior and middle cerebral vessels are patent bilaterally. Cannot evaluate accurately for stenotic disease given the degradation. Both vertebral arteries are patent to the basilar. The basilar appears widely patent. Major posterior circulation branch vessels are patent. Large posterior communicating arteries bilaterally. Detail not sufficient to evaluate for stenotic disease. IMPRESSION: No acute or reversible finding. Atrophy and chronic small-vessel ischemic changes. 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 Motion degraded MR angiogram. Major vessels show flow. No evidence of occlusion or severe proximal stenosis, though detail is limited by motion. Electronically Signed   By: Paulina Fusi M.D.   On: 10/22/2016 08:40        Scheduled Meds: . albuterol  2.5 mg Nebulization TID  . flecainide  100 mg Oral BID  . furosemide  60 mg Intravenous BID  . metoprolol succinate  50 mg Oral BID  . potassium chloride  10 mEq Oral Once per day on Sun Tue Thu Sat  . potassium chloride  20 mEq Oral Q M,W,F  . potassium chloride  40 mEq Oral Once  . traZODone  100 mg Oral QHS  . warfarin  2.5 mg Oral Once  . Warfarin - Pharmacist Dosing Inpatient   Does not apply q1800   Continuous Infusions:   LOS: 3 days    Time  spent:    Kaneshia Cater, MD [facility] Pager [phone]  If 7PM-7AM, please contact night-coverage [email or web address] Password [email or web address] 10/22/2016, 5:07 PM

## 2016-10-22 NOTE — Progress Notes (Signed)
EEG Completed; Results Pending  

## 2016-10-22 NOTE — Care Management (Signed)
Patient declines SNF. Patient will return home with [facility].

## 2016-10-22 NOTE — Clinical Social Work Note (Signed)
Patient and spouse decline SNF. They state that they are very pleased with [facility] home health and would like to continue those services. Patient has previously been at [facility] for rehab and did not have a pleasant experience. At baseline, patient lives with her husband and uses a walker.   LCSW signing off.      Odelia Graciano, Juleen China, LCSW

## 2016-10-22 NOTE — Care Management Important Message (Signed)
Important Message  Patient Details  Name: Danielle Brooks MRN: [id number] Date of Birth: January 09, 1929   Medicare Important Message Given:  Yes    Amro Winebarger, Chrystine Oiler, RN 10/22/2016, 9:57 AM

## 2016-10-22 NOTE — Consult Note (Signed)
Danielle A. Merlene Laughter, MD     [email or web address]          Danielle Brooks is an 83 y.o. female.   ASSESSMENT/PLAN: 1. Acute encephalopathy - resolved: The etiology likely multifactorial but includes medication and acute illness due to UTI and congestive heart failure. She does have a previous history of large vessel cortical infarct so seizures also a possibility. I will get an EEG.  2. Paroxysmal atrial fibrillation: She can be restarted on warfarin.     The patient is a 83 year old white female who presents with congestive heart failure. During the course of the hospitalization she came confused. At times her that she did receive Ativan especially during the time that she was confused. However, she seemed to have a UTI. The patient has gradually improved. She tells that she has had episodes of confusion in the past. She has had TIA and stroke in the past although she cannot relate to me specific symptoms. She tells me that none of her symptoms were permanent. She reports feeling well today. She does not report having focal numbness, weakness, dysarthria or dysphagia. No chest pain or shortness of breath is reported. Her INR is reduced and this appeared therapy range. I believe this was held the concern of the patient's confusion and possibly into trees intracranial hemorrhage. Initial CT suggested an acute ischemic stroke but this was not confirmed by MRI which she states showed a chronic infarct.   GENERAL: This is a pleasant obese female who is in no acute distress.  HEENT: Marked right exotropia; neck supple; no trauma  ABDOMEN: soft  EXTREMITIES: There is mild to moderate pitting edema involving the feet and distal legs.  BACK: This is normal  SKIN: Normal by inspection.    MENTAL STATUS: Alert and oriented. Speech, language and cognition are generally intact. Judgment and insight normal.   CRANIAL NERVES: Pupils are equal, round and reactive to light and  accomodation; extra ocular movements are full, there is no significant nystagmus; visual fields are full; upper and lower facial muscles are normal in strength and symmetric, there is no flattening of the nasolabial folds; tongue is midline; uvula is midline; shoulder elevation is normal.  MOTOR: There is mild weakness of the upper extremities proximally graded as 4/5. Distal strength is good. Normal tone, bulk and strength of the legs; there is mild downward drift involving the left upper extremity. No drift on the right.  COORDINATION: Left finger to nose is normal, right finger to nose is normal, No rest tremor; no intention tremor; no postural tremor; no bradykinesia.  REFLEXES: Deep tendon reflexes are symmetrical and normal. Babinski reflexes are flexor bilaterally.   SENSATION: Normal to light touch, temperature, and pinprick.      Blood pressure 127/69, pulse 62, temperature 98.5 F (36.9 C), temperature source Oral, resp. rate 20, height '5\' 2"'  (1.575 m), weight 176 lb (79.8 kg), SpO2 99 %.  Past Medical History:  Diagnosis Date  . Atrial fibrillation (HCC)    Paroxysmal, history of normal LV and negative ischemic workup  . Mixed hyperlipidemia   . Osteoporosis   . TIA (transient ischemic attack)   . Vitamin D deficiency     History reviewed. No pertinent surgical history.  Family History  Problem Relation Age of Onset  . Coronary artery disease Father   . Alzheimer's disease Mother   . Hypertension Sister     Social History:  reports that she has never smoked. She has never  used smokeless tobacco. She reports that she does not drink alcohol or use drugs.  Allergies:  Allergies  Allergen Reactions  . Bactrim [Sulfamethoxazole-Trimethoprim] Rash  . Codeine Itching  . Novocain [Procaine Hcl] Other (See Comments)    Sob and passed out years ago at dentist office    Medications: Prior to Admission medications   Medication Sig Start Date End Date Taking?  Authorizing Provider  flecainide (TAMBOCOR) 100 MG tablet Take 1 tablet (100 mg total) by mouth 2 (two) times daily. 10/12/16  Yes Satira Sark, MD  furosemide (LASIX) 20 MG tablet Take 2 tabs (47m) on Monday Wednesday Friday alternating with 20 mg 1 tab Patient taking differently: Take 20-40 mg by mouth daily. Patient changed directions to one daily (Take 2 tabs (427m on Monday Wednesday Friday alternating with 20 mg (1 tab) Tuesday, Thursday, Saturday and Sunday. 08/08/15  Yes McSatira SarkMD  metoprolol succinate (TOPROL-XL) 50 MG 24 hr tablet take 1 tablet by mouth twice a day 06/14/16  Yes McSatira SarkMD  potassium chloride (K-DUR) 10 MEQ tablet Take 10-20 mEq by mouth daily. With lasix:Patient changed directions to one daily (Take 2 tabs (2023mon Monday Wednesday Friday alternating with 10 mg 1 tab on Tuesday, Thursday, Saturday, Sunday) 01/15/16  Yes [provider]  traZODone (DESYREL) 100 MG tablet take 1/2 tablet by mouth at bedtime **MAY INCREASE TO 1 TABLET AT BEDTIME IF NEEDED** Patient taking differently: Take 100 mg by mouth at bedtime.  01/18/12  Yes McDSatira SarkD  Vitamin D, Ergocalciferol, (DRISDOL) 50000 UNITS CAPS Take 50,000 Units by mouth every Tuesday.    Yes [provider]  warfarin (COUMADIN) 5 MG tablet Take 5 mg by mouth every evening. [facility]   Yes [provider]    Scheduled Meds: . albuterol  2.5 mg Nebulization TID  . flecainide  100 mg Oral BID  . furosemide  60 mg Intravenous BID  . metoprolol succinate  50 mg Oral BID  . potassium chloride  10 mEq Oral Once per day on Sun Tue Thu Sat  . potassium chloride  20 mEq Oral Q M,W,F  . potassium chloride  40 mEq Oral Once  . traZODone  100 mg Oral QHS  . warfarin  2.5 mg Oral Once  . Warfarin - Pharmacist Dosing Inpatient   Does not apply q1800   Continuous Infusions: PRN Meds:.acetaminophen **OR** acetaminophen, albuterol, LORazepam,  ondansetron **OR** ondansetron (ZOFRAN) IV     Results for orders placed or performed during the hospital encounter of 10/19/16 (from the past 48 hour(s))  Blood gas, arterial     Status: Abnormal   Collection Time: 10/20/16  3:40 PM  Result Value Ref Range   O2 Content 4.0 L/min   Delivery systems NASAL CANNULA    pH, Arterial 7.438 7.350 - 7.450   pCO2 arterial 46.7 32.0 - 48.0 mmHg   pO2, Arterial 80.7 (L) 83.0 - 108.0 mmHg   Bicarbonate 30.2 (H) 20.0 - 28.0 mmol/L   Acid-Base Excess 6.8 (H) 0.0 - 2.0 mmol/L   O2 Saturation 95.9 %   Patient temperature 37.0    Collection site RIGHT RADIAL    Drawn by [id number] Sample type ARTERIAL DRAW    Allens test (pass/fail) PASS PASS  Basic metabolic panel     Status: Abnormal   Collection Time: 10/21/16  5:48 AM  Result Value Ref Range   Sodium 137 135 - 145 mmol/L  Potassium 3.0 (L) 3.5 - 5.1 mmol/L   Chloride 98 (L) 101 - 111 mmol/L   CO2 32 22 - 32 mmol/L   Glucose, Bld 96 65 - 99 mg/dL   BUN 16 6 - 20 mg/dL   Creatinine, Ser 0.80 0.44 - 1.00 mg/dL   Calcium 8.4 (L) 8.9 - 10.3 mg/dL   GFR calc non Af Amer >60 >60 mL/min   GFR calc Af Amer >60 >60 mL/min    Comment: (NOTE) The eGFR has been calculated using the CKD EPI equation. This calculation has not been validated in all clinical situations. eGFR's persistently <60 mL/min signify possible Chronic Kidney Disease.    Anion gap 7 5 - 15  Protime-INR     Status: Abnormal   Collection Time: 10/21/16  5:48 AM  Result Value Ref Range   Prothrombin Time 27.2 (H) 11.4 - 15.2 seconds   INR 4.09   Basic metabolic panel     Status: Abnormal   Collection Time: 10/22/16  5:48 AM  Result Value Ref Range   Sodium 139 135 - 145 mmol/L   Potassium 4.0 3.5 - 5.1 mmol/L    Comment: DELTA CHECK NOTED   Chloride 94 (L) 101 - 111 mmol/L   CO2 37 (H) 22 - 32 mmol/L   Glucose, Bld 96 65 - 99 mg/dL   BUN 16 6 - 20 mg/dL   Creatinine, Ser 0.96 0.44 - 1.00 mg/dL   Calcium 8.9 8.9 - 10.3  mg/dL   GFR calc non Af Amer 52 (L) >60 mL/min   GFR calc Af Amer >60 >60 mL/min    Comment: (NOTE) The eGFR has been calculated using the CKD EPI equation. This calculation has not been validated in all clinical situations. eGFR's persistently <60 mL/min signify possible Chronic Kidney Disease.    Anion gap 8 5 - 15  Protime-INR     Status: Abnormal   Collection Time: 10/22/16  5:48 AM  Result Value Ref Range   Prothrombin Time 20.7 (H) 11.4 - 15.2 seconds   INR 1.75   Hemoglobin A1c     Status: None   Collection Time: 10/22/16  5:48 AM  Result Value Ref Range   Hgb A1c MFr Bld 5.0 4.8 - 5.6 %    Comment: (NOTE) Pre diabetes:          5.7%-6.4% Diabetes:              >6.4% Glycemic control for   <7.0% adults with diabetes    Mean Plasma Glucose 96.8 mg/dL    Comment: Performed at [facility] [address], [place] [address]  CBC     Status: None   Collection Time: 10/22/16  5:48 AM  Result Value Ref Range   WBC 9.2 4.0 - 10.5 K/uL   RBC 4.40 3.87 - 5.11 MIL/uL   Hemoglobin 12.5 12.0 - 15.0 g/dL   HCT 38.9 36.0 - 46.0 %   MCV 88.4 78.0 - 100.0 fL   MCH 28.4 26.0 - 34.0 pg   MCHC 32.1 30.0 - 36.0 g/dL   RDW 14.3 11.5 - 15.5 %   Platelets 289 150 - 400 K/uL  Magnesium     Status: None   Collection Time: 10/22/16  5:48 AM  Result Value Ref Range   Magnesium 1.7 1.7 - 2.4 mg/dL  Lipid panel     Status: Abnormal   Collection Time: 10/22/16  5:48 AM  Result Value Ref Range  Cholesterol 128 0 - 200 mg/dL   Triglycerides 85 <150 mg/dL   HDL 39 (L) >40 mg/dL   Total CHOL/HDL Ratio 3.3 RATIO   VLDL 17 0 - 40 mg/dL   LDL Cholesterol 72 0 - 99 mg/dL    Comment:        Total Cholesterol/HDL:CHD Risk Coronary Heart Disease Risk Table                     Men   Women  1/2 Average Risk   3.4   3.3  Average Risk       5.0   4.4  2 X Average Risk   9.6   7.1  3 X Average Risk  23.4   11.0        Use the calculated Patient Ratio above and the CHD Risk  Table to determine the patient's CHD Risk.        ATP III CLASSIFICATION (LDL):  <100     mg/dL   Optimal  100-129  mg/dL   Near or Above                    Optimal  130-159  mg/dL   Borderline  160-189  mg/dL   High  >190     mg/dL   Very High     Studies/Results:   BRAIN MRI MRA FINDINGS: MRI HEAD FINDINGS  Brain: Diffusion imaging does not show any acute or subacute infarction. There are chronic small-vessel ischemic changes of the pons. No focal cerebellar insult. Cerebral hemispheres show old small vessel infarctions in the thalami, basal ganglia and hemispheric deep white matter. Encephalomalacia in the right frontal deep white matter with mild involvement of the right insular region. No large vessel territory infarction. No mass lesion, hemorrhage, hydrocephalus or extra-axial collection. Ventricular size is in proportion to the degree of brain atrophy.  Vascular: Major vessels at the base of the brain show flow.  Skull and upper cervical spine: Negative  Sinuses/Orbits: Clear/normal  Other: None significant  MRA HEAD FINDINGS  Considerable motion degradation. Both internal carotid arteries are widely patent through the skullbase and siphon regions. The anterior and middle cerebral vessels are patent bilaterally. Cannot evaluate accurately for stenotic disease given the degradation. Both vertebral arteries are patent to the basilar. The basilar appears widely patent. Major posterior circulation branch vessels are patent. Large posterior communicating arteries bilaterally. Detail not sufficient to evaluate for stenotic disease.  IMPRESSION: No acute or reversible finding.  Atrophy and chronic small-vessel ischemic changes. 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  Motion degraded MR angiogram. Major vessels show flow. No evidence of occlusion or severe proximal stenosis, though detail is limited by motion.   The brain MRI and MRA are both  reviewed in person. There is moderate global atrophy with some evidence of ventriculomegaly. There is moderate size encephalomalacia involving the right frontal lobe which extends to the insular cortex. There is significant increase signal involving the periventricular white matter region and also deep white matter seen on FLAIR imaging. This is moderate and confluent. No acute findings are seen. No hemorrhages appreciated. MRA shows absence PCA bilaterally of unclear significance. There is absence of the A1 segment on the right side along with dropout of the M2 segment on the right side.         Lillar Bianca A. Merlene Brooks, M.D.  Diplomate, Tax adviser of Psychiatry and Neurology ( Neurology). 10/22/2016, 3:18 PM

## 2016-10-22 NOTE — Progress Notes (Signed)
Progress Note  Patient Name: Danielle Brooks Date of Encounter: 10/22/2016  Primary Cardiologist: [name]  Subjective   Feeling better today. Breathing and leg swelling are better.  Inpatient Medications    Scheduled Meds: . albuterol  2.5 mg Nebulization TID  . flecainide  100 mg Oral BID  . furosemide  60 mg Intravenous BID  . metoprolol succinate  50 mg Oral BID  . potassium chloride  10 mEq Oral Once per day on Sun Tue Thu Sat  . potassium chloride  20 mEq Oral Q M,W,F  . potassium chloride  40 mEq Oral Once  . traZODone  100 mg Oral QHS   Continuous Infusions:  PRN Meds: acetaminophen **OR** acetaminophen, albuterol, LORazepam, ondansetron **OR** ondansetron (ZOFRAN) IV   Vital Signs    Vitals:   10/21/16 2141 10/21/16 2230 10/22/16 0714 10/22/16 0830  BP: (!) 106/53 112/60  (!) 140/42  Pulse: 68     Resp: 20 18  18   Temp: 98.3 F (36.8 C) 98.4 F (36.9 C)    TempSrc: Oral Oral    SpO2: 100% 98% 99% 100%  Weight:      Height:        Intake/Output Summary (Last 24 hours) at 10/22/16 0933 Last data filed at 10/22/16 0700  Gross per 24 hour  Intake              840 ml  Output             4400 ml  Net            -3560 ml   Filed Weights   10/19/16 1811 10/19/16 2258 10/20/16 0340  Weight: 180 lb (81.6 kg) 190 lb (86.2 kg) 188 lb 9.6 oz (85.5 kg)    Telemetry    NSR - Personally Reviewed  ECG      Physical Exam   GEN: No acute distress.   Neck: No JVD Cardiac: RRR, no murmurs, rubs, or gallops.  Respiratory: Bibasilar crackles GI: Soft, nontender, non-distended  MS: 1+ pitting b/l leg edema (improved from yesterday). Neuro:  Nonfocal  Psych: Normal affect   Labs    Chemistry Recent Labs Lab 10/19/16 1833 10/20/16 1027 10/21/16 0548 10/22/16 0548  NA 139 138 137 139  K 4.0 3.2* 3.0* 4.0  CL 101 97* 98* 94*  CO2 28 33* 32 37*  GLUCOSE 103* 115* 96 96  BUN 19 16 16 16   CREATININE 0.87 0.86 0.80 0.96  CALCIUM 8.8* 8.4* 8.4*  8.9  PROT 6.8  --   --   --   ALBUMIN 3.4*  --   --   --   AST 46*  --   --   --   ALT 90*  --   --   --   ALKPHOS 72  --   --   --   BILITOT 0.5  --   --   --   GFRNONAA 59* 59* >60 52*  GFRAA >60 >60 >60 >60  ANIONGAP 10 8 7 8      Hematology Recent Labs Lab 10/19/16 1833 10/22/16 0548  WBC 7.0 9.2  RBC 4.34 4.40  HGB 12.4 12.5  HCT 38.6 38.9  MCV 88.9 88.4  MCH 28.6 28.4  MCHC 32.1 32.1  RDW 14.5 14.3  PLT 317 289    Cardiac Enzymes Recent Labs Lab 10/19/16 1833  TROPONINI <0.03   No results for input(s): TROPIPOC in the last 168 hours.   BNP Recent  Labs Lab 10/19/16 1842  BNP 1,395.0*     DDimer No results for input(s): DDIMER in the last 168 hours.   Radiology    Ct Head Wo Contrast  Result Date: 10/20/2016 CLINICAL DATA:  Shortness of breath, altered level consciousness. Decompensated CHF, atrial fibrillation on Coumadin. EXAM: CT HEAD WITHOUT CONTRAST TECHNIQUE: Contiguous axial images were obtained from the base of the skull through the vertex without intravenous contrast. COMPARISON:  CT HEAD Jul 16, 2016 FINDINGS: Moderately motion degraded examination. BRAIN: No intraparenchymal hemorrhage, mass effect nor midline shift. Moderate ventriculomegaly with mild sulcal effacement at the convexities. Confluent supratentorial white matter hypodensities, worse within RIGHT juxta cortical 2D white matter. Focal blurring of the RIGHT frontal gray-white matter. No abnormal extra-axial fluid collections. Basal cisterns are patent. VASCULAR: Mild calcific atherosclerosis of the carotid siphons. SKULL: No skull fracture. No significant scalp soft tissue swelling. SINUSES/ORBITS: The mastoid air-cells and included paranasal sinuses are well-aerated.The included ocular globes and orbital contents are non-suspicious. Brass post bilateral ocular lens implants. OTHER: None. IMPRESSION: 1. Moderately motion degraded examination. 2. Acute to subacute small RIGHT frontal lobe/ MCA  territories suspected infarct. 3. Worsening moderate to severe chronic small vessel ischemic disease. 4. Chronic mild suspected normal pressure hydrocephalus. Electronically Signed   By: Awilda Metro M.D.   On: 10/20/2016 17:46   Mr Brain Wo Contrast  Result Date: 10/22/2016 CLINICAL DATA:  Altered mental status over the last 2 days. EXAM: MRI HEAD WITHOUT CONTRAST MRA HEAD WITHOUT CONTRAST TECHNIQUE: Multiplanar, multiecho pulse sequences of the brain and surrounding structures were obtained without intravenous contrast. Angiographic images of the head were obtained using MRA technique without contrast. COMPARISON:  Head CT 10/22/2016.  MRI 04/19/2006. FINDINGS: MRI HEAD FINDINGS Brain: Diffusion imaging does not show any acute or subacute infarction. There are chronic small-vessel ischemic changes of the pons. No focal cerebellar insult. Cerebral hemispheres show old small vessel infarctions in the thalami, basal ganglia and hemispheric deep white matter. Encephalomalacia in the right frontal deep white matter with mild involvement of the right insular region. No large vessel territory infarction. No mass lesion, hemorrhage, hydrocephalus or extra-axial collection. Ventricular size is in proportion to the degree of brain atrophy. Vascular: Major vessels at the base of the brain show flow. Skull and upper cervical spine: Negative Sinuses/Orbits: Clear/normal Other: None significant MRA HEAD FINDINGS Considerable motion degradation. Both internal carotid arteries are widely patent through the skullbase and siphon regions. The anterior and middle cerebral vessels are patent bilaterally. Cannot evaluate accurately for stenotic disease given the degradation. Both vertebral arteries are patent to the basilar. The basilar appears widely patent. Major posterior circulation branch vessels are patent. Large posterior communicating arteries bilaterally. Detail not sufficient to evaluate for stenotic disease.  IMPRESSION: No acute or reversible finding. Atrophy and chronic small-vessel ischemic changes. 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 Motion degraded MR angiogram. Major vessels show flow. No evidence of occlusion or severe proximal stenosis, though detail is limited by motion. Electronically Signed   By: Paulina Fusi M.D.   On: 10/22/2016 08:40   US Carotid Bilateral (at [facility] And [facility] Only)  Result Date: 10/22/2016 CLINICAL DATA:  83 year old female with a history of stroke. Cardiovascular risk factors include hypertension, known prior stroke/TIA, coronary artery disease, hyperlipidemia. EXAM: BILATERAL CAROTID DUPLEX ULTRASOUND TECHNIQUE: Wallace Cullens scale imaging, color Doppler and duplex ultrasound were performed of bilateral carotid and vertebral arteries in the neck. COMPARISON:  No prior duplex FINDINGS: Criteria: Quantification of carotid stenosis is based  on velocity parameters that correlate the residual internal carotid diameter with NASCET-based stenosis levels, using the diameter of the distal internal carotid lumen as the denominator for stenosis measurement. The following velocity measurements were obtained: RIGHT ICA:  Systolic 87 cm/sec, Diastolic 17 cm/sec CCA:  87 cm/sec SYSTOLIC ICA/CCA RATIO:  1.0 ECA:  65 cm/sec LEFT ICA:  Systolic 73 cm/sec, Diastolic 13 cm/sec CCA:  65 cm/sec SYSTOLIC ICA/CCA RATIO:  1.1 ECA:  50 cm/sec Right Brachial SBP: Not acquired Left Brachial SBP: Not acquired RIGHT CAROTID ARTERY: No significant calcified disease of the right common carotid artery. Intermediate waveform maintained. Heterogeneous plaque without significant calcifications at the right carotid bifurcation. Low resistance waveform of the right ICA. Mild tortuosity RIGHT VERTEBRAL ARTERY: Antegrade flow with low resistance waveform. LEFT CAROTID ARTERY: No significant calcified disease of the left common carotid artery. Intermediate waveform maintained. Heterogeneous plaque at the left carotid bifurcation  without significant calcifications. Low resistance waveform of the left ICA. Mild tortuosity LEFT VERTEBRAL ARTERY:  Antegrade flow with low resistance waveform. IMPRESSION: Color duplex indicates minimal heterogeneous plaque, with no hemodynamically significant stenosis by duplex criteria in the extracranial cerebrovascular circulation. Signed, Yvone Neu. Loreta Ave, DO [facility] Radiology Electronically Signed   By: Gilmer Mor D.O.   On: 10/22/2016 09:22   Mr Maxine Glenn Head Wo Contrast  Result Date: 10/22/2016 CLINICAL DATA:  Altered mental status over the last 2 days. EXAM: MRI HEAD WITHOUT CONTRAST MRA HEAD WITHOUT CONTRAST TECHNIQUE: Multiplanar, multiecho pulse sequences of the brain and surrounding structures were obtained without intravenous contrast. Angiographic images of the head were obtained using MRA technique without contrast. COMPARISON:  Head CT 10/22/2016.  MRI 04/19/2006. FINDINGS: MRI HEAD FINDINGS Brain: Diffusion imaging does not show any acute or subacute infarction. There are chronic small-vessel ischemic changes of the pons. No focal cerebellar insult. Cerebral hemispheres show old small vessel infarctions in the thalami, basal ganglia and hemispheric deep white matter. Encephalomalacia in the right frontal deep white matter with mild involvement of the right insular region. No large vessel territory infarction. No mass lesion, hemorrhage, hydrocephalus or extra-axial collection. Ventricular size is in proportion to the degree of brain atrophy. Vascular: Major vessels at the base of the brain show flow. Skull and upper cervical spine: Negative Sinuses/Orbits: Clear/normal Other: None significant MRA HEAD FINDINGS Considerable motion degradation. Both internal carotid arteries are widely patent through the skullbase and siphon regions. The anterior and middle cerebral vessels are patent bilaterally. Cannot evaluate accurately for stenotic disease  given the degradation. Both vertebral arteries are patent to the basilar. The basilar appears widely patent. Major posterior circulation branch vessels are patent. Large posterior communicating arteries bilaterally. Detail not sufficient to evaluate for stenotic disease. IMPRESSION: No acute or reversible finding. Atrophy and chronic small-vessel ischemic changes. 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 Motion degraded MR angiogram. Major vessels show flow. No evidence of occlusion or severe proximal stenosis, though detail is limited by motion. Electronically Signed   By: Paulina Fusi M.D.   On: 10/22/2016 08:40    Cardiac Studies   Echocardiogram (10/20/16):  - Left ventricle: The cavity size was normal. Wall thickness was normal. Systolic function was normal. The estimated ejection fraction was in the range of 55% to 60%. Wall motion was normal; there were no regional wall motion abnormalities. Doppler parameters are consistent with restrictive physiology, indicative of decreased left ventricular diastolic compliance and/or increased left atrial pressure. - Aortic valve: Moderately calcified annulus. Trileaflet; moderately  calcified leaflets. There was moderate regurgitation. Mean gradient (S): 7 mm Hg. Peak gradient (S): 14 mm Hg. VTI ratio of LVOT to aortic valve: 0.52. Moderately sclerotic to mildly stenotic aortic valve. Valve area (Vmax): 1.66 cm^2. Valve area (Vmean): 1.68 cm^2. - Mitral valve: Moderately calcified annulus. There was mild regurgitation. - Left atrium: The atrium was mildly dilated. - Right ventricle: The cavity size was mildly dilated. Systolic function was mildly reduced. - Right atrium: The atrium was mildly dilated. Central venous pressure (est): 8 mm Hg. - Atrial septum: No defect or patent foramen ovale was identified. - Tricuspid valve: There was mild regurgitation. - Pulmonary arteries: Systolic pressure was severely  increased. PA peak pressure: 66 mm Hg (S). - Pericardium, extracardiac: There was a right pleural effusion.  Impressions:  - Normal LV wall thickness with LVEF 55-60% and restrictive diastolic filling pattern. Mild left atrial enlargement. Moderately calcified mitral annulus with mild mitral regurgitation. Moderately sclerotic to mildly stenotic aortic valve with moderate aortic regurgitation. Mild dilated right ventricle with mildly reduced contraction. Mild right atrial enlargement. Mild tricuspid regurgitation with evidence of severe pulmonary hypertension, estimated PASP 66 mmHg. Right pleural effusion is noted.  Patient Profile     83 y.o. female with history of atrial fibrillation, TIA, COPD, and hypertension admitted with acute on chronic diastolic heart failure.  Assessment & Plan    1. Acute on chronic diastolic heart failure: Nearly 3.3 L output in last 24+ hours. Will give an additional 5 mg of metolazone in addition to the 60 mg IV bid Lasix she is taking. K is normal today. Symptomatically improved as well. Echocardiogram demonstrated restrictive diastolic physiology as detailed above.   2. Paroxysmal atrial fibrillation: Remains in sinus rhythm on flecainide and metoprolol succinate. Warfarin on hold. INR 1.75. Given adequate urinary output now and concern for CVA, will resume.  3. Hypertension: Blood pressure is mildly elevated. Will monitor in light of ongoing diuresis.  Signed, Prentice Docker, MD  10/22/2016, 9:33 AM

## 2016-10-23 DIAGNOSIS — J9601 Acute respiratory failure with hypoxia: Secondary | ICD-10-CM

## 2016-10-23 DIAGNOSIS — J9600 Acute respiratory failure, unspecified whether with hypoxia or hypercapnia: Secondary | ICD-10-CM | POA: Diagnosis present

## 2016-10-23 LAB — BASIC METABOLIC PANEL
Anion gap: 10 (ref 5–15)
BUN: 20 mg/dL (ref 6–20)
CHLORIDE: 91 mmol/L — AB (ref 101–111)
CO2: 34 mmol/L — ABNORMAL HIGH (ref 22–32)
Calcium: 8.9 mg/dL (ref 8.9–10.3)
Creatinine, Ser: 0.96 mg/dL (ref 0.44–1.00)
GFR calc non Af Amer: 52 mL/min — ABNORMAL LOW (ref >60)
Glucose, Bld: 85 mg/dL (ref 65–99)
POTASSIUM: 3.2 mmol/L — AB (ref 3.5–5.1)
SODIUM: 135 mmol/L (ref 135–145)

## 2016-10-23 LAB — PROTIME-INR
INR: 1.24
PROTHROMBIN TIME: 15.7 s — AB (ref 11.4–15.2)

## 2016-10-23 MED ORDER — FUROSEMIDE 10 MG/ML IJ SOLN
40.0000 mg | Freq: Two times a day (BID) | INTRAMUSCULAR | Status: DC
  Administered 2016-10-23 – 2016-10-24 (×3): 40 mg via INTRAVENOUS
  Filled 2016-10-23 (×2): qty 4

## 2016-10-23 MED ORDER — POTASSIUM CHLORIDE CRYS ER 20 MEQ PO TBCR
40.0000 meq | EXTENDED_RELEASE_TABLET | ORAL | Status: AC
  Administered 2016-10-23 (×2): 40 meq via ORAL
  Filled 2016-10-23 (×2): qty 2

## 2016-10-23 MED ORDER — WARFARIN SODIUM 5 MG PO TABS
5.0000 mg | ORAL_TABLET | Freq: Once | ORAL | Status: AC
  Administered 2016-10-23: 5 mg via ORAL
  Filled 2016-10-23: qty 1

## 2016-10-23 NOTE — Progress Notes (Signed)
PROGRESS NOTE    Danielle Brooks  UVJ:[id number] DOB: Jul 11, 1928 DOA: 10/19/2016 PCP: Ignatius Specking, MD    Brief Narrative:  83 year old female with a history of atrial fibrillation on Coumadin, diastolic CHF, was sent to the hospital by her cardiologist for shortness of breath. She had worsening pedal edema and had evidence of decompensated CHF. She is admitted for further diuresis.   Assessment & Plan:   Principal Problem:   CHF, acute on chronic [facility]) Active Problems:   Paroxysmal atrial fibrillation (HCC)   Essential hypertension   Acute respiratory failure (HCC)   1. Acute on chronic CHF, suspect diastolic . Echo shows preserved EF with restrictive pattern. Cardiology following. volume status -6.7L since admission. Her weight is down approximately 20 pounds since admission. Decrease Lasix to 40 mg BID. Monitor intake and output. Will possibly transition to oral diuretics in AM 2. Acute respiratory failure with hypoxia. Related to decompensated CHF. Weaning off oxygen as tolerated. 3. Paroxysmal atrial fibrillation. Rate is controlled. Continue on flecainide and beta blockers. She is anticoagulated with Coumadin. 4. COPD . continue bronchodilators. No evidence of wheezing today. 5. Right pleural effusion. Suspect is related to CHF. Continue to monitor with diuresis. If does not improve, may need thoracentesis. 6. Hypertension. Blood pressures controlled. Continue current medications. 7. Remote CVA. Patient was noted to be confused 8/22. CT head indicated acute/subacute infarct in right frontal/MCA distribution. MRI of the brain showed encephalomalacia of right frontal lobe without any acute infarcts. Seen by neurology and EEG has been ordered to rule out seizures. It is possible that her change in mental status was related to medications vs. Acute illness and hypoxia.   DVT prophylaxis: Coumadin Code Status: Full code Family Communication: discussed with husband in the  room Disposition Plan: will need SNF placement on discharge   Consultants:   Cardiology  Neurology  Procedures:  Echo: - Normal LV wall thickness with LVEF 55-60% and restrictive   diastolic filling pattern. Mild left atrial enlargement.   Moderately calcified mitral annulus with mild mitral   regurgitation. Moderately sclerotic to mildly stenotic aortic   valve with moderate aortic regurgitation. Mild dilated right   ventricle with mildly reduced contraction. Mild right atrial   enlargement. Mild tricuspid regurgitation with evidence of severe   pulmonary hypertension, estimated PASP 66 mmHg. Right pleural   effusion is noted.    Antimicrobials:      Subjective: Feeling better. No chest pain. Shortness of breath improving. Denies dizziness on standing.  Objective: Vitals:   10/23/16 0523 10/23/16 0756 10/23/16 1452 10/23/16 1458  BP: (!) 130/39   (!) 98/35  Pulse: (!) 54   (!) 55  Resp: 18   20  Temp: 98.1 F (36.7 C)   (!) 97.5 F (36.4 C)  TempSrc: Oral   Axillary  SpO2: 98% 96% 93% 96%  Weight: 77.1 kg (169 lb 14.4 oz)     Height:        Intake/Output Summary (Last 24 hours) at 10/23/16 1739 Last data filed at 10/23/16 1500  Gross per 24 hour  Intake              480 ml  Output             1800 ml  Net            -1320 ml   Filed Weights   10/20/16 0340 10/22/16 1223 10/23/16 0523  Weight: 85.5 kg (188 lb 9.6 oz) 79.8 kg (176 lb)  77.1 kg (169 lb 14.4 oz)    Examination:  General exam: Alert, awake, oriented x 3 Respiratory system: diminished breath sounds at right base. Respiratory effort normal. Cardiovascular system:irregular. No murmurs, rubs, gallops. Gastrointestinal system: Abdomen is nondistended, soft and nontender. No organomegaly or masses felt. Normal bowel sounds heard. Central nervous system: Alert and oriented. No focal neurological deficits. Extremities: 1-2+ edema bilaterally Skin: No rashes, lesions or ulcers Psychiatry:  Judgement and insight appear normal. Mood & affect appropriate.     Data Reviewed: I have personally reviewed following labs and imaging studies  CBC:  Recent Labs Lab 10/19/16 1833 10/22/16 0548  WBC 7.0 9.2  NEUTROABS 4.8  --   HGB 12.4 12.5  HCT 38.6 38.9  MCV 88.9 88.4  PLT 317 289   Basic Metabolic Panel:  Recent Labs Lab 10/19/16 1833 10/20/16 1027 10/21/16 0548 10/22/16 0548 10/23/16 0619  NA 139 138 137 139 135  K 4.0 3.2* 3.0* 4.0 3.2*  CL 101 97* 98* 94* 91*  CO2 28 33* 32 37* 34*  GLUCOSE 103* 115* 96 96 85  BUN 19 16 16 16 20   CREATININE 0.87 0.86 0.80 0.96 0.96  CALCIUM 8.8* 8.4* 8.4* 8.9 8.9  MG  --   --   --  1.7  --    GFR: Estimated Creatinine Clearance: 40.4 mL/min (by C-G formula based on SCr of 0.96 mg/dL). Liver Function Tests:  Recent Labs Lab 10/19/16 1833  AST 46*  ALT 90*  ALKPHOS 72  BILITOT 0.5  PROT 6.8  ALBUMIN 3.4*   No results for input(s): LIPASE, AMYLASE in the last 168 hours. No results for input(s): AMMONIA in the last 168 hours. Coagulation Profile:  Recent Labs Lab 10/20/16 1027 10/21/16 0548 10/22/16 0548 10/23/16 0619  INR 3.00 2.47 1.75 1.24   Cardiac Enzymes:  Recent Labs Lab 10/19/16 1833  TROPONINI <0.03   BNP (last 3 results) No results for input(s): PROBNP in the last 8760 hours. HbA1C:  Recent Labs  10/22/16 0548  HGBA1C 5.0   CBG: No results for input(s): GLUCAP in the last 168 hours. Lipid Profile:  Recent Labs  10/22/16 0548  CHOL 128  HDL 39*  LDLCALC 72  TRIG 85  CHOLHDL 3.3   Thyroid Function Tests: No results for input(s): TSH, T4TOTAL, FREET4, T3FREE, THYROIDAB in the last 72 hours. Anemia Panel:  Recent Labs  10/22/16 1610  VITAMINB12 400   Sepsis Labs: No results for input(s): PROCALCITON, LATICACIDVEN in the last 168 hours.  No results found for this or any previous visit (from the past 240 hour(s)).       Radiology Studies: Mr Brain 76  Contrast  Result Date: 10/22/2016 CLINICAL DATA:  Altered mental status over the last 2 days. EXAM: MRI HEAD WITHOUT CONTRAST MRA HEAD WITHOUT CONTRAST TECHNIQUE: Multiplanar, multiecho pulse sequences of the brain and surrounding structures were obtained without intravenous contrast. Angiographic images of the head were obtained using MRA technique without contrast. COMPARISON:  Head CT 10/22/2016.  MRI 04/19/2006. FINDINGS: MRI HEAD FINDINGS Brain: Diffusion imaging does not show any acute or subacute infarction. There are chronic small-vessel ischemic changes of the pons. No focal cerebellar insult. Cerebral hemispheres show old small vessel infarctions in the thalami, basal ganglia and hemispheric deep white matter. Encephalomalacia in the right frontal deep white matter with mild involvement of the right insular region. No large vessel territory infarction. No mass lesion, hemorrhage, hydrocephalus or extra-axial collection. Ventricular size is in proportion to the  degree of brain atrophy. Vascular: Major vessels at the base of the brain show flow. Skull and upper cervical spine: Negative Sinuses/Orbits: Clear/normal Other: None significant MRA HEAD FINDINGS Considerable motion degradation. Both internal carotid arteries are widely patent through the skullbase and siphon regions. The anterior and middle cerebral vessels are patent bilaterally. Cannot evaluate accurately for stenotic disease given the degradation. Both vertebral arteries are patent to the basilar. The basilar appears widely patent. Major posterior circulation branch vessels are patent. Large posterior communicating arteries bilaterally. Detail not sufficient to evaluate for stenotic disease. IMPRESSION: No acute or reversible finding. Atrophy and chronic small-vessel ischemic changes. 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 Motion degraded MR angiogram. Major vessels show flow. No evidence of occlusion or severe proximal stenosis, though  detail is limited by motion. Electronically Signed   By: Paulina Fusi M.D.   On: 10/22/2016 08:40   US Carotid Bilateral (at [facility] And [facility] Only)  Result Date: 10/22/2016 CLINICAL DATA:  83 year old female with a history of stroke. Cardiovascular risk factors include hypertension, known prior stroke/TIA, coronary artery disease, hyperlipidemia. EXAM: BILATERAL CAROTID DUPLEX ULTRASOUND TECHNIQUE: Wallace Cullens scale imaging, color Doppler and duplex ultrasound were performed of bilateral carotid and vertebral arteries in the neck. COMPARISON:  No prior duplex FINDINGS: Criteria: Quantification of carotid stenosis is based on velocity parameters that correlate the residual internal carotid diameter with NASCET-based stenosis levels, using the diameter of the distal internal carotid lumen as the denominator for stenosis measurement. The following velocity measurements were obtained: RIGHT ICA:  Systolic 87 cm/sec, Diastolic 17 cm/sec CCA:  87 cm/sec SYSTOLIC ICA/CCA RATIO:  1.0 ECA:  65 cm/sec LEFT ICA:  Systolic 73 cm/sec, Diastolic 13 cm/sec CCA:  65 cm/sec SYSTOLIC ICA/CCA RATIO:  1.1 ECA:  50 cm/sec Right Brachial SBP: Not acquired Left Brachial SBP: Not acquired RIGHT CAROTID ARTERY: No significant calcified disease of the right common carotid artery. Intermediate waveform maintained. Heterogeneous plaque without significant calcifications at the right carotid bifurcation. Low resistance waveform of the right ICA. Mild tortuosity RIGHT VERTEBRAL ARTERY: Antegrade flow with low resistance waveform. LEFT CAROTID ARTERY: No significant calcified disease of the left common carotid artery. Intermediate waveform maintained. Heterogeneous plaque at the left carotid bifurcation without significant calcifications. Low resistance waveform of the left ICA. Mild tortuosity LEFT VERTEBRAL ARTERY:  Antegrade flow with low resistance waveform. IMPRESSION: Color duplex indicates minimal heterogeneous plaque, with no hemodynamically  significant stenosis by duplex criteria in the extracranial cerebrovascular circulation. Signed, Yvone Neu. Loreta Ave, DO [facility] Electronically Signed   By: Gilmer Mor D.O.   On: 10/22/2016 09:22   Mr Maxine Glenn Head Wo Contrast  Result Date: 10/22/2016 CLINICAL DATA:  Altered mental status over the last 2 days. EXAM: MRI HEAD WITHOUT CONTRAST MRA HEAD WITHOUT CONTRAST TECHNIQUE: Multiplanar, multiecho pulse sequences of the brain and surrounding structures were obtained without intravenous contrast. Angiographic images of the head were obtained using MRA technique without contrast. COMPARISON:  Head CT 10/22/2016.  MRI 04/19/2006. FINDINGS: MRI HEAD FINDINGS Brain: Diffusion imaging does not show any acute or subacute infarction. There are chronic small-vessel ischemic changes of the pons. No focal cerebellar insult. Cerebral hemispheres show old small vessel infarctions in the thalami, basal ganglia and hemispheric deep white matter. Encephalomalacia in the right frontal deep white matter with mild involvement of the right insular region. No large vessel territory infarction. No mass lesion, hemorrhage, hydrocephalus or extra-axial collection. Ventricular size is in proportion to the degree of  brain atrophy. Vascular: Major vessels at the base of the brain show flow. Skull and upper cervical spine: Negative Sinuses/Orbits: Clear/normal Other: None significant MRA HEAD FINDINGS Considerable motion degradation. Both internal carotid arteries are widely patent through the skullbase and siphon regions. The anterior and middle cerebral vessels are patent bilaterally. Cannot evaluate accurately for stenotic disease given the degradation. Both vertebral arteries are patent to the basilar. The basilar appears widely patent. Major posterior circulation branch vessels are patent. Large posterior communicating arteries bilaterally. Detail not sufficient to evaluate  for stenotic disease. IMPRESSION: No acute or reversible finding. Atrophy and chronic small-vessel ischemic changes. 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 Motion degraded MR angiogram. Major vessels show flow. No evidence of occlusion or severe proximal stenosis, though detail is limited by motion. Electronically Signed   By: Paulina Fusi M.D.   On: 10/22/2016 08:40        Scheduled Meds: . albuterol  2.5 mg Nebulization TID  . flecainide  100 mg Oral BID  . furosemide  40 mg Intravenous BID  . metoprolol succinate  50 mg Oral BID  . potassium chloride  10 mEq Oral Once per day on Sun Tue Thu Sat  . potassium chloride  20 mEq Oral Q M,W,F  . potassium chloride  40 mEq Oral Once  . potassium chloride  40 mEq Oral Q3H  . traZODone  100 mg Oral QHS  . warfarin  5 mg Oral Once  . Warfarin - Pharmacist Dosing Inpatient   Does not apply q1800   Continuous Infusions:   LOS: 4 days    Time spent:    Shedric Fredericks, MD [facility] Pager [phone]  If 7PM-7AM, please contact night-coverage [email or web address] Password [facility] 10/23/2016, 5:39 PM

## 2016-10-23 NOTE — Progress Notes (Signed)
ANTICOAGULATION CONSULT NOTE  Pharmacy Consult for Warfarin Indication: atrial fibrillation  Allergies  Allergen Reactions  . Bactrim [Sulfamethoxazole-Trimethoprim] Rash  . Codeine Itching  . Novocain [Procaine Hcl] Other (See Comments)    Sob and passed out years ago at dentist office   Patient Measurements: Height: 5\' 2"  (157.5 cm) Weight: 169 lb 14.4 oz (77.1 kg) IBW/kg (Calculated) : 50.1 HEPARIN DW (KG): 69.7   Vital Signs: Temp: 98.1 F (36.7 C) (08/25 0523) Temp Source: Oral (08/25 0523) BP: 130/39 (08/25 0523) Pulse Rate: 54 (08/25 0523)  Labs:  Recent Labs  10/21/16 0548 10/22/16 0548 10/23/16 0619  HGB  --  12.5  --   HCT  --  38.9  --   PLT  --  289  --   LABPROT 27.2* 20.7* 15.7*  INR 2.47 1.75 1.24  CREATININE 0.80 0.96 0.96    Estimated Creatinine Clearance: 40.4 mL/min (by C-G formula based on SCr of 0.96 mg/dL).   Medical History: Past Medical History:  Diagnosis Date  . Atrial fibrillation (HCC)    Paroxysmal, history of normal LV and negative ischemic workup  . Mixed hyperlipidemia   . Osteoporosis   . TIA (transient ischemic attack)   . Vitamin D deficiency     Medications:  Prescriptions Prior to Admission  Medication Sig Dispense Refill Last Dose  . flecainide (TAMBOCOR) 100 MG tablet Take 1 tablet (100 mg total) by mouth 2 (two) times daily. 65 tablet 3 10/19/2016 at Unknown time  . furosemide (LASIX) 20 MG tablet Take 2 tabs (40mg ) on Monday Wednesday Friday alternating with 20 mg 1 tab (Patient taking differently: Take 20-40 mg by mouth daily. Patient changed directions to one daily (Take 2 tabs (40mg ) on Monday Wednesday Friday alternating with 20 mg (1 tab) Tuesday, Thursday, Saturday and Sunday.) 180 tablet 3 10/19/2016 at Unknown time  . metoprolol succinate (TOPROL-XL) 50 MG 24 hr tablet take 1 tablet by mouth twice a day 60 tablet 0 10/19/2016 at 800a  . potassium chloride (K-DUR) 10 MEQ tablet Take 10-20 mEq by mouth daily. With  lasix:Patient changed directions to one daily (Take 2 tabs (20mg ) on Monday Wednesday Friday alternating with 10 mg 1 tab on Tuesday, Thursday, Saturday, Sunday)  0 10/19/2016 at Unknown time  . traZODone (DESYREL) 100 MG tablet take 1/2 tablet by mouth at bedtime **MAY INCREASE TO 1 TABLET AT BEDTIME IF NEEDED** (Patient taking differently: Take 100 mg by mouth at bedtime. ) 30 tablet 2 10/18/2016 at Unknown time  . Vitamin D, Ergocalciferol, (DRISDOL) 50000 UNITS CAPS Take 50,000 Units by mouth every Tuesday.    10/19/2016 at Unknown time  . warfarin (COUMADIN) 5 MG tablet Take 5 mg by mouth every evening. Managed by [facility] office   10/18/2016 at 1700   Assessment: Okay for Protocol.  Last dose 8/20 per home med list. Dose not given on 8/22 due to increased somnolence, unable to take PO. Coumadin dose restarted 8/24. INR 1.24. Will give 1 time booster dose.  Goal of Therapy:  INR 2-3   Plan:  Warfarin 5mg  PO x 1. Daily PT/INR Monitor for signs and symptoms of bleeding.   Elder Cyphers, BS Loura Back, BCPS Clinical Pharmacist Pager #[phone] 10/23/2016,11:46 AM

## 2016-10-24 ENCOUNTER — Inpatient Hospital Stay (HOSPITAL_COMMUNITY): Payer: Medicare Other

## 2016-10-24 LAB — BASIC METABOLIC PANEL
Anion gap: 11 (ref 5–15)
BUN: 25 mg/dL — AB (ref 6–20)
CALCIUM: 9 mg/dL (ref 8.9–10.3)
CHLORIDE: 93 mmol/L — AB (ref 101–111)
CO2: 33 mmol/L — ABNORMAL HIGH (ref 22–32)
CREATININE: 0.97 mg/dL (ref 0.44–1.00)
GFR calc Af Amer: 60 mL/min — ABNORMAL LOW (ref >60)
GFR calc non Af Amer: 51 mL/min — ABNORMAL LOW (ref >60)
Glucose, Bld: 88 mg/dL (ref 65–99)
Potassium: 3.6 mmol/L (ref 3.5–5.1)
SODIUM: 137 mmol/L (ref 135–145)

## 2016-10-24 LAB — PROTIME-INR
INR: 1.25
Prothrombin Time: 15.8 seconds — ABNORMAL HIGH (ref 11.4–15.2)

## 2016-10-24 MED ORDER — FUROSEMIDE 40 MG PO TABS
40.0000 mg | ORAL_TABLET | Freq: Every day | ORAL | Status: DC
  Administered 2016-10-25: 40 mg via ORAL
  Filled 2016-10-24 (×2): qty 1

## 2016-10-24 MED ORDER — WARFARIN SODIUM 5 MG PO TABS
5.0000 mg | ORAL_TABLET | Freq: Once | ORAL | Status: AC
Start: 2016-10-24 — End: 2016-10-24
  Administered 2016-10-24: 5 mg via ORAL
  Filled 2016-10-24: qty 1

## 2016-10-24 NOTE — Progress Notes (Signed)
Danielle Brooks  ZPH:[id number] DOB: 10/19/1928 DOA: 10/19/2016 PCP: Ignatius Specking, MD    Brief Narrative:  83 year old female with a history of atrial fibrillation on Coumadin, diastolic CHF, was sent to the hospital by her cardiologist for shortness of breath. She had worsening pedal edema and had evidence of decompensated CHF. She is admitted for further diuresis.   Assessment & Plan:   Principal Problem:   CHF, acute on chronic [facility]) Active Problems:   Paroxysmal atrial fibrillation (HCC)   Essential hypertension   Acute respiratory failure (HCC)   1. Acute on chronic CHF, suspect diastolic . Echo shows preserved EF with restrictive pattern. Cardiology following. volume status -10.6L since admission. Her weight is down approximately 22 pounds since admission. Will change Lasix to 40 mg by mouth daily. Repeat chest x-ray in a.m. to evaluate effusion.. Monitor intake and output.  2. Acute respiratory failure with hypoxia. Related to decompensated CHF. Weaning off oxygen as tolerated. 3. Paroxysmal atrial fibrillation. Rate is controlled. Continue on flecainide and beta blockers. She is anticoagulated with Coumadin. 4. COPD . continue bronchodilators. No evidence of wheezing today. 5. Right pleural effusion. Suspect is related to CHF. Continue to monitor with diuresis. If does not improve, may need thoracentesis. Reevaluate with chest x-ray 6. Hypertension. Blood pressures controlled. Continue current medications. 7. Remote CVA. Patient was noted to be confused 8/22. CT head indicated acute/subacute infarct in right frontal/MCA distribution. MRI of the brain showed encephalomalacia of right frontal lobe without any acute infarcts. Seen by neurology and EEG has been ordered to rule out seizures. It is possible that her change in mental status was related to medications vs. Acute illness and hypoxia.   DVT prophylaxis: Coumadin Code Status: Full code Family Communication:  No family in room Disposition Plan: Return to [place] on discharge   Consultants:   Cardiology  Neurology  Procedures:  Echo: - Normal LV wall thickness with LVEF 55-60% and restrictive   diastolic filling pattern. Mild left atrial enlargement.   Moderately calcified mitral annulus with mild mitral   regurgitation. Moderately sclerotic to mildly stenotic aortic   valve with moderate aortic regurgitation. Mild dilated right   ventricle with mildly reduced contraction. Mild right atrial   enlargement. Mild tricuspid regurgitation with evidence of severe   pulmonary hypertension, estimated PASP 66 mmHg. Right pleural   effusion is noted.    Antimicrobials:      Subjective: Use of breathing continues to slowly improve. No chest pain  Objective: Vitals:   10/23/16 2117 10/23/16 2226 10/24/16 0502 10/24/16 1427  BP: (!) 100/53  (!) 107/34 (!) 118/42  Pulse: (!) 55 62 (!) 54 80  Resp: 20  20 20   Temp: 98.7 F (37.1 C)  98.5 F (36.9 C) 98.4 F (36.9 C)  TempSrc: Oral  Oral Oral  SpO2: 96%  95% 98%  Weight:   76.3 kg (168 lb 4.8 oz)   Height:        Intake/Output Summary (Last 24 hours) at 10/24/16 1923 Last data filed at 10/24/16 1818  Gross per 24 hour  Intake                0 ml  Output             3400 ml  Net            -3400 ml   Filed Weights   10/22/16 1223 10/23/16 0523 10/24/16 0502  Weight: 79.8 kg (176  lb) 77.1 kg (169 lb 14.4 oz) 76.3 kg (168 lb 4.8 oz)    Examination:  General exam: Alert, awake, oriented x 3 Respiratory system: diminished breath sounds at right base. Respiratory effort normal. Cardiovascular system:irregular. No murmurs, rubs, gallops. Gastrointestinal system: Abdomen is nondistended, soft and nontender. No organomegaly or masses felt. Normal bowel sounds heard. Central nervous system: Alert and oriented. No focal neurological deficits. Extremities: 1-2+ edema bilaterally Skin: No rashes, lesions or ulcers Psychiatry:  Judgement and insight appear normal. Mood & affect appropriate.     Data Reviewed: I have personally reviewed following labs and imaging studies  CBC:  Recent Labs Lab 10/19/16 1833 10/22/16 0548  WBC 7.0 9.2  NEUTROABS 4.8  --   HGB 12.4 12.5  HCT 38.6 38.9  MCV 88.9 88.4  PLT 317 289   Basic Metabolic Panel:  Recent Labs Lab 10/20/16 1027 10/21/16 0548 10/22/16 0548 10/23/16 0619 10/24/16 0628  NA 138 137 139 135 137  K 3.2* 3.0* 4.0 3.2* 3.6  CL 97* 98* 94* 91* 93*  CO2 33* 32 37* 34* 33*  GLUCOSE 115* 96 96 85 88  BUN 16 16 16 20  25*  CREATININE 0.86 0.80 0.96 0.96 0.97  CALCIUM 8.4* 8.4* 8.9 8.9 9.0  MG  --   --  1.7  --   --    GFR: Estimated Creatinine Clearance: 39.8 mL/min (by C-G formula based on SCr of 0.97 mg/dL). Liver Function Tests:  Recent Labs Lab 10/19/16 1833  AST 46*  ALT 90*  ALKPHOS 72  BILITOT 0.5  PROT 6.8  ALBUMIN 3.4*   No results for input(s): LIPASE, AMYLASE in the last 168 hours. No results for input(s): AMMONIA in the last 168 hours. Coagulation Profile:  Recent Labs Lab 10/20/16 1027 10/21/16 0548 10/22/16 0548 10/23/16 0619 10/24/16 0628  INR 3.00 2.47 1.75 1.24 1.25   Cardiac Enzymes:  Recent Labs Lab 10/19/16 1833  TROPONINI <0.03   BNP (last 3 results) No results for input(s): PROBNP in the last 8760 hours. HbA1C:  Recent Labs  10/22/16 0548  HGBA1C 5.0   CBG: No results for input(s): GLUCAP in the last 168 hours. Lipid Profile:  Recent Labs  10/22/16 0548  CHOL 128  HDL 39*  LDLCALC 72  TRIG 85  CHOLHDL 3.3   Thyroid Function Tests: No results for input(s): TSH, T4TOTAL, FREET4, T3FREE, THYROIDAB in the last 72 hours. Anemia Panel:  Recent Labs  10/22/16 1610  VITAMINB12 400   Sepsis Labs: No results for input(s): PROCALCITON, LATICACIDVEN in the last 168 hours.  No results found for this or any previous visit (from the past 240 hour(s)).       Radiology Studies: No  results found.      Scheduled Meds: . flecainide  100 mg Oral BID  . furosemide  40 mg Intravenous BID  . metoprolol succinate  50 mg Oral BID  . potassium chloride  10 mEq Oral Once per day on Sun Tue Thu Sat  . potassium chloride  20 mEq Oral Q M,W,F  . potassium chloride  40 mEq Oral Once  . traZODone  100 mg Oral QHS  . Warfarin - Pharmacist Dosing Inpatient   Does not apply q1800   Continuous Infusions:   LOS: 5 days    Time spent:    Mileena Rothenberger, MD [facility] Pager [phone]  If 7PM-7AM, please contact night-coverage [email or web address] Password [facility] 10/24/2016, 7:23 PM

## 2016-10-24 NOTE — Progress Notes (Signed)
Pt scheduled to have DG 2 view tonight but does not want to have it done. Pt said it was too late to have it done and wants to wait until morning.

## 2016-10-24 NOTE — Progress Notes (Signed)
ANTICOAGULATION CONSULT NOTE  Pharmacy Consult for Warfarin Indication: atrial fibrillation  Allergies  Allergen Reactions  . Bactrim [Sulfamethoxazole-Trimethoprim] Rash  . Codeine Itching  . Novocain [Procaine Hcl] Other (See Comments)    Sob and passed out years ago at dentist office   Patient Measurements: Height: 5\' 2"  (157.5 cm) Weight: 168 lb 4.8 oz (76.3 kg) IBW/kg (Calculated) : 50.1 HEPARIN DW (KG): 69.7   Vital Signs: Temp: 98.5 F (36.9 C) (08/26 0502) Temp Source: Oral (08/26 0502) BP: 107/34 (08/26 0502) Pulse Rate: 54 (08/26 0502)  Labs:  Recent Labs  10/22/16 0548 10/23/16 0619 10/24/16 0628  HGB 12.5  --   --   HCT 38.9  --   --   PLT 289  --   --   LABPROT 20.7* 15.7* 15.8*  INR 1.75 1.24 1.25  CREATININE 0.96 0.96 0.97    Estimated Creatinine Clearance: 39.8 mL/min (by C-G formula based on SCr of 0.97 mg/dL).   Medical History: Past Medical History:  Diagnosis Date  . Atrial fibrillation (HCC)    Paroxysmal, history of normal LV and negative ischemic workup  . Mixed hyperlipidemia   . Osteoporosis   . TIA (transient ischemic attack)   . Vitamin D deficiency     Medications:  Prescriptions Prior to Admission  Medication Sig Dispense Refill Last Dose  . flecainide (TAMBOCOR) 100 MG tablet Take 1 tablet (100 mg total) by mouth 2 (two) times daily. 65 tablet 3 10/19/2016 at Unknown time  . furosemide (LASIX) 20 MG tablet Take 2 tabs (40mg ) on Monday Wednesday Friday alternating with 20 mg 1 tab (Patient taking differently: Take 20-40 mg by mouth daily. Patient changed directions to one daily (Take 2 tabs (40mg ) on Monday Wednesday Friday alternating with 20 mg (1 tab) Tuesday, Thursday, Saturday and Sunday.) 180 tablet 3 10/19/2016 at Unknown time  . metoprolol succinate (TOPROL-XL) 50 MG 24 hr tablet take 1 tablet by mouth twice a day 60 tablet 0 10/19/2016 at 800a  . potassium chloride (K-DUR) 10 MEQ tablet Take 10-20 mEq by mouth daily. With  lasix:Patient changed directions to one daily (Take 2 tabs (20mg ) on Monday Wednesday Friday alternating with 10 mg 1 tab on Tuesday, Thursday, Saturday, Sunday)  0 10/19/2016 at Unknown time  . traZODone (DESYREL) 100 MG tablet take 1/2 tablet by mouth at bedtime **MAY INCREASE TO 1 TABLET AT BEDTIME IF NEEDED** (Patient taking differently: Take 100 mg by mouth at bedtime. ) 30 tablet 2 10/18/2016 at Unknown time  . Vitamin D, Ergocalciferol, (DRISDOL) 50000 UNITS CAPS Take 50,000 Units by mouth every Tuesday.    10/19/2016 at Unknown time  . warfarin (COUMADIN) 5 MG tablet Take 5 mg by mouth every evening. Managed by [facility]   10/18/2016 at 1700   Assessment: Okay for Protocol.  Last dose 8/20 per home med list. Dose not given on 8/22 due to increased somnolence, unable to take PO. Coumadin dose restarted 8/24. INR 1.25.   Goal of Therapy:  INR 2-3   Plan:  Warfarin 5mg  PO x 1. Daily PT/INR Monitor for signs and symptoms of bleeding.   Elder Cyphers, BS Loura Back, BCPS Clinical Pharmacist Pager [phone] 10/24/2016,10:32 AM

## 2016-10-25 ENCOUNTER — Inpatient Hospital Stay (HOSPITAL_COMMUNITY): Payer: Medicare Other

## 2016-10-25 LAB — BASIC METABOLIC PANEL
Anion gap: 11 (ref 5–15)
BUN: 30 mg/dL — ABNORMAL HIGH (ref 6–20)
CALCIUM: 9.1 mg/dL (ref 8.9–10.3)
CHLORIDE: 92 mmol/L — AB (ref 101–111)
CO2: 34 mmol/L — ABNORMAL HIGH (ref 22–32)
CREATININE: 1.08 mg/dL — AB (ref 0.44–1.00)
GFR calc Af Amer: 52 mL/min — ABNORMAL LOW (ref >60)
GFR calc non Af Amer: 45 mL/min — ABNORMAL LOW (ref >60)
Glucose, Bld: 90 mg/dL (ref 65–99)
Potassium: 3 mmol/L — ABNORMAL LOW (ref 3.5–5.1)
SODIUM: 137 mmol/L (ref 135–145)

## 2016-10-25 LAB — PROTIME-INR
INR: 1.18
PROTHROMBIN TIME: 15 s (ref 11.4–15.2)

## 2016-10-25 LAB — HOMOCYSTEINE: Homocysteine: 17.1 umol/L — ABNORMAL HIGH (ref 0.0–15.0)

## 2016-10-25 MED ORDER — WARFARIN SODIUM 5 MG PO TABS: 6.0000 mg | ORAL_TABLET | Freq: Once | ORAL | Status: DC

## 2016-10-25 MED ORDER — POTASSIUM CHLORIDE CRYS ER 10 MEQ PO TBCR: 20.0000 meq | EXTENDED_RELEASE_TABLET | ORAL | Status: DC

## 2016-10-25 MED ORDER — POTASSIUM CHLORIDE CRYS ER 20 MEQ PO TBCR
40.0000 meq | EXTENDED_RELEASE_TABLET | ORAL | Status: AC
  Administered 2016-10-25 (×2): 40 meq via ORAL
  Filled 2016-10-25 (×2): qty 2

## 2016-10-25 MED ORDER — FUROSEMIDE 40 MG PO TABS: 40.0000 mg | ORAL_TABLET | Freq: Every day | ORAL | 0 refills | Status: DC

## 2016-10-25 NOTE — Procedures (Signed)
  [facility] [name] A. [name], MD     [email or web address]           HISTORY: The patient is a 83 year old female who presents with altered mental status. The quality has been episodic at times raising the suspicion for possible complex partial seizures.  MEDICATIONS: Scheduled Meds: . flecainide  100 mg Oral BID  . furosemide  40 mg Oral Daily  . metoprolol succinate  50 mg Oral BID  . potassium chloride  10 mEq Oral Once per day on Sun Tue Thu Sat  . potassium chloride  20 mEq Oral Q M,W,F  . potassium chloride  40 mEq Oral Once  . traZODone  100 mg Oral QHS  . Warfarin - Pharmacist Dosing Inpatient   Does not apply q1800   Continuous Infusions: PRN Meds:.acetaminophen **OR** acetaminophen, albuterol, LORazepam, ondansetron **OR** ondansetron (ZOFRAN) IV  Prior to Admission medications   Medication Sig Start Date End Date Taking? Authorizing Provider  flecainide (TAMBOCOR) 100 MG tablet Take 1 tablet (100 mg total) by mouth 2 (two) times daily. 10/12/16  Yes Jonelle Sidle, MD  furosemide (LASIX) 20 MG tablet Take 2 tabs (40mg ) on Monday Wednesday Friday alternating with 20 mg 1 tab Patient taking differently: Take 20-40 mg by mouth daily. Patient changed directions to one daily (Take 2 tabs (40mg ) on Monday Wednesday Friday alternating with 20 mg (1 tab) Tuesday, Thursday, Saturday and Sunday. 08/08/15  Yes Jonelle Sidle, MD  metoprolol succinate (TOPROL-XL) 50 MG 24 hr tablet take 1 tablet by mouth twice a day 06/14/16  Yes Jonelle Sidle, MD  potassium chloride (K-DUR) 10 MEQ tablet Take 10-20 mEq by mouth daily. With lasix:Patient changed directions to one daily (Take 2 tabs (20mg ) on Monday Wednesday Friday alternating with 10 mg 1 tab on Tuesday, Thursday, Saturday, Sunday) 01/15/16  Yes [provider]  traZODone (DESYREL) 100 MG tablet take 1/2 tablet by mouth at bedtime **MAY INCREASE TO 1 TABLET AT BEDTIME IF NEEDED** Patient taking  differently: Take 100 mg by mouth at bedtime.  01/18/12  Yes Jonelle Sidle, MD  Vitamin D, Ergocalciferol, (DRISDOL) 50000 UNITS CAPS Take 50,000 Units by mouth every Tuesday.    Yes [provider]  warfarin (COUMADIN) 5 MG tablet Take 5 mg by mouth every evening. Managed by [facility] office   Yes [provider]      ANALYSIS: A 16 channel recording using standard 10 20 measurements is conducted for 20 minutes. There is a well-formed posterior dominant rhythm of 20 Hz which attenuates with eye opening. There is beta activity observing the frontal areas. Awake and sleep activities are observed. K complexes and sleep spindles are documented indicating stage II non-REM sleep. Photic stimulation and hyperventilation were not carried out. There is no focal or lateral slowing. There is no epileptiform activities observed.   IMPRESSION: This is a normal recording of the awake and sleep states.      Delmo Matty A. [name], M.D.  Diplomate, Biomedical engineer of Psychiatry and Neurology ( Neurology).

## 2016-10-25 NOTE — Care Management Important Message (Signed)
Important Message  Patient Details  Name: Danielle Brooks MRN: [id number] Date of Birth: [birth year]   Medicare Important Message Given:  Yes    Malcolm Metro, RN 10/25/2016, 2:00 PM

## 2016-10-25 NOTE — Discharge Summary (Signed)
Physician Discharge Summary  GRETNA BERGIN ZOX:[id number] DOB: [birth year] DOA: 10/19/2016  PCP: Ignatius Specking, MD  Admit date: 10/19/2016 Discharge date: 10/25/2016  Admitted From: home Disposition:  home  Recommendations for Outpatient Follow-up:  1. Follow up with PCP in 1-2 weeks 2. Please obtain BMP/CBC in one week 3. Repeat chest xray in 2-3 weeks to ensure resolution of RLL effusion 4. Follow up with cardiology on 9/10  Home Health: Home health from [facility] Equipment/Devices: oxygen 2L  Discharge Condition: stable CODE STATUS: full code Diet recommendation: Heart Healthy   Brief/Interim Summary: 83 year old female with a history of atrial fibrillation on Coumadin, diastolic CHF, was sent to the hospital by her cardiologist for shortness of breath. She had worsening pedal edema and had evidence of decompensated CHF. She is admitted for further diuresis.  Discharge Diagnoses:  Principal Problem:   CHF, acute on chronic [facility]) Active Problems:   Paroxysmal atrial fibrillation (HCC)   Essential hypertension   Acute respiratory failure (HCC)  1. Acute on chronic CHF, suspect diastolic . Echo shows preserved EF with restrictive pattern. Cardiology following. volume status -10.7L since admission. Her weight is down approximately 24 pounds since admission. Will change Lasix to 40 mg by mouth daily. Repeat chest x-ray shows improving of right-sided effusion. She'll need a repeat x-ray to 3 weeks to ensure further improvement. Continue on oral diuretics. Follow with cardiology as an outpatient. 2. Acute respiratory failure with hypoxia. Related to decompensated CHF. She'll be discharged home with 2 L supplemental oxygen. This can be further weaned as an outpatient.. 3. Paroxysmal atrial fibrillation. Rate is controlled. Continue on flecainide and beta blockers. She is anticoagulated with Coumadin. 4. COPD . continue bronchodilators. No evidence of wheezing 5. Right pleural effusion.  Suspect is related to CHF. Continue to monitor with diuresis. She is already had some improvement with diuresis. Repeat chest x-ray next few weeks to ensure resolution 6. Hypertension. Blood pressures controlled. Continue current medications. 7. Remote CVA. Patient was noted to be confused 8/22. CT head indicated acute/subacute infarct in right frontal/MCA distribution. MRI of the brain showed encephalomalacia of right frontal lobe without any acute infarcts. Seen by neurology and EEG did not show any acute findings. It is possible that her change in mental status was related to medications vs. Acute illness and hypoxia. Mental status is back to baseline 8. Generalized weakness. Seen by physical therapy recommended skilled nursing facility placement. Patient has refused and wishes to return home.  Discharge Instructions  Discharge Instructions    Diet - low sodium heart healthy    Complete by:  As directed    Increase activity slowly    Complete by:  As directed      Allergies as of 10/25/2016      Reactions   Bactrim [sulfamethoxazole-trimethoprim] Rash   Codeine Itching   Novocain [procaine Hcl] Other (See Comments)   Sob and passed out years ago at dentist office      Medication List    TAKE these medications   flecainide 100 MG tablet Commonly known as:  TAMBOCOR Take 1 tablet (100 mg total) by mouth 2 (two) times daily.   furosemide 40 MG tablet Commonly known as:  LASIX Take 1 tablet (40 mg total) by mouth daily. What changed:  medication strength  how much to take  how to take this  when to take this  additional instructions   metoprolol succinate 50 MG 24 hr tablet Commonly known as:  TOPROL-XL take 1 tablet  by mouth twice a day   potassium chloride 10 MEQ tablet Commonly known as:  K-DUR Take 10-20 mEq by mouth daily. With lasix:Patient changed directions to one daily (Take 2 tabs (20mg ) on Monday Wednesday Friday alternating with 10 mg 1 tab on Tuesday,  Thursday, Saturday, Sunday)   traZODone 100 MG tablet Commonly known as:  DESYREL take 1/2 tablet by mouth at bedtime **MAY INCREASE TO 1 TABLET AT BEDTIME IF NEEDED** What changed:  how much to take  how to take this  when to take this  additional instructions   Vitamin D (Ergocalciferol) 50000 units Caps capsule Commonly known as:  DRISDOL Take 50,000 Units by mouth every Tuesday.   warfarin 5 MG tablet Commonly known as:  COUMADIN Take 5 mg by mouth every evening. Managed by [facility] office            Durable Medical Equipment        Start     Ordered   10/25/16 1328  For home use only DME oxygen  Once    Question Answer Comment  Mode or (Route) Nasal cannula   Liters per Minute 2   Frequency Continuous (stationary and portable oxygen unit needed)   Oxygen conserving device Yes   Oxygen delivery system Gas      10/25/16 1327       Discharge Care Instructions        Start     Ordered   10/25/16 0000  furosemide (LASIX) 40 MG tablet  Daily    Comments:  DOSE INCREASE 08/08/15   10/25/16 1329   10/25/16 0000  Increase activity slowly     10/25/16 1329   10/25/16 0000  Diet - low sodium heart healthy     08 /27/18 1329      Allergies  Allergen Reactions  . Bactrim [Sulfamethoxazole-Trimethoprim] Rash  . Codeine Itching  . Novocain [Procaine Hcl] Other (See Comments)    Sob and passed out years ago at dentist office    Consultations:  Cardiology  neurology   Procedures/Studies: Dg Chest 2 View  Result Date: 10/25/2016 CLINICAL DATA:  Shortness of Breath EXAM: CHEST  2 VIEW COMPARISON:  10/19/2016 FINDINGS: There is cardiomegaly. Right lower lobe consolidation noted with elevation of the right hemidiaphragm small to moderate right pleural effusion. No focal opacity on the left. No acute bony abnormality. Stable lower thoracic compression fracture. IMPRESSION: Cardiomegaly. Small to moderate right pleural effusion with right lower lobe atelectasis or  pneumonia. Electronically Signed   By: Charlett Nose M.D.   On: 10/25/2016 10:30   Dg Chest 2 View  Result Date: 10/19/2016 CLINICAL DATA:  Shortness of breath EXAM: CHEST  2 VIEW COMPARISON:  Jul 16, 2016 FINDINGS: There is a moderate right pleural effusion which is larger in the interval with underlying opacity. Mild pulmonary venous congestion without overt edema. There is a compression fracture in a lower thoracic vertebral body which is new when compared to May of 2018. No other acute abnormalities. IMPRESSION: 1. There is a compression fracture of a lower thoracic vertebral body which is new compared to May 2018. Recommend clinical correlation for pain in this region. MRI could further assess acuity if clinically warranted. 2. The moderate right pleural effusion is larger in the interval. Mild pulmonary venous congestion. Electronically Signed   By: Gerome Sam III M.D   On: 10/19/2016 19:35   Ct Head Wo Contrast  Result Date: 10/20/2016 CLINICAL DATA:  Shortness of breath, altered level consciousness.  Decompensated CHF, atrial fibrillation on Coumadin. EXAM: CT HEAD WITHOUT CONTRAST TECHNIQUE: Contiguous axial images were obtained from the base of the skull through the vertex without intravenous contrast. COMPARISON:  CT HEAD Jul 16, 2016 FINDINGS: Moderately motion degraded examination. BRAIN: No intraparenchymal hemorrhage, mass effect nor midline shift. Moderate ventriculomegaly with mild sulcal effacement at the convexities. Confluent supratentorial white matter hypodensities, worse within RIGHT juxta cortical 2D white matter. Focal blurring of the RIGHT frontal gray-white matter. No abnormal extra-axial fluid collections. Basal cisterns are patent. VASCULAR: Mild calcific atherosclerosis of the carotid siphons. SKULL: No skull fracture. No significant scalp soft tissue swelling. SINUSES/ORBITS: The mastoid air-cells and included paranasal sinuses are well-aerated.The included ocular globes and  orbital contents are non-suspicious. Brass post bilateral ocular lens implants. OTHER: None. IMPRESSION: 1. Moderately motion degraded examination. 2. Acute to subacute small RIGHT frontal lobe/ MCA territories suspected infarct. 3. Worsening moderate to severe chronic small vessel ischemic disease. 4. Chronic mild suspected normal pressure hydrocephalus. Electronically Signed   By: Awilda Metro M.D.   On: 10/20/2016 17:46   Mr Brain Wo Contrast  Result Date: 10/22/2016 CLINICAL DATA:  Altered mental status over the last 2 days. EXAM: MRI HEAD WITHOUT CONTRAST MRA HEAD WITHOUT CONTRAST TECHNIQUE: Multiplanar, multiecho pulse sequences of the brain and surrounding structures were obtained without intravenous contrast. Angiographic images of the head were obtained using MRA technique without contrast. COMPARISON:  Head CT 10/22/2016.  MRI 04/19/2006. FINDINGS: MRI HEAD FINDINGS Brain: Diffusion imaging does not show any acute or subacute infarction. There are chronic small-vessel ischemic changes of the pons. No focal cerebellar insult. Cerebral hemispheres show old small vessel infarctions in the thalami, basal ganglia and hemispheric deep white matter. Encephalomalacia in the right frontal deep white matter with mild involvement of the right insular region. No large vessel territory infarction. No mass lesion, hemorrhage, hydrocephalus or extra-axial collection. Ventricular size is in proportion to the degree of brain atrophy. Vascular: Major vessels at the base of the brain show flow. Skull and upper cervical spine: Negative Sinuses/Orbits: Clear/normal Other: None significant MRA HEAD FINDINGS Considerable motion degradation. Both internal carotid arteries are widely patent through the skullbase and siphon regions. The anterior and middle cerebral vessels are patent bilaterally. Cannot evaluate accurately for stenotic disease given the degradation. Both vertebral arteries are patent to the basilar. The  basilar appears widely patent. Major posterior circulation branch vessels are patent. Large posterior communicating arteries bilaterally. Detail not sufficient to evaluate for stenotic disease. IMPRESSION: No acute or reversible finding. Atrophy and chronic small-vessel ischemic changes. 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 Motion degraded MR angiogram. Major vessels show flow. No evidence of occlusion or severe proximal stenosis, though detail is limited by motion. Electronically Signed   By: Paulina Fusi M.D.   On: 10/22/2016 08:40   US Carotid Bilateral (at [facility] Only)  Result Date: 10/22/2016 CLINICAL DATA:  83 year old female with a history of stroke. Cardiovascular risk factors include hypertension, known prior stroke/TIA, coronary artery disease, hyperlipidemia. EXAM: BILATERAL CAROTID DUPLEX ULTRASOUND TECHNIQUE: Wallace Cullens scale imaging, color Doppler and duplex ultrasound were performed of bilateral carotid and vertebral arteries in the neck. COMPARISON:  No prior duplex FINDINGS: Criteria: Quantification of carotid stenosis is based on velocity parameters that correlate the residual internal carotid diameter with NASCET-based stenosis levels, using the diameter of the distal internal carotid lumen as the denominator for stenosis measurement. The following velocity measurements were obtained: RIGHT ICA:  Systolic 87 cm/sec, Diastolic 17 cm/sec CCA:  87 cm/sec SYSTOLIC ICA/CCA RATIO:  1.0 ECA:  65 cm/sec LEFT ICA:  Systolic 73 cm/sec, Diastolic 13 cm/sec CCA:  65 cm/sec SYSTOLIC ICA/CCA RATIO:  1.1 ECA:  50 cm/sec Right Brachial SBP: Not acquired Left Brachial SBP: Not acquired RIGHT CAROTID ARTERY: No significant calcified disease of the right common carotid artery. Intermediate waveform maintained. Heterogeneous plaque without significant calcifications at the right carotid bifurcation. Low resistance waveform of the right ICA. Mild tortuosity RIGHT VERTEBRAL ARTERY: Antegrade flow with low  resistance waveform. LEFT CAROTID ARTERY: No significant calcified disease of the left common carotid artery. Intermediate waveform maintained. Heterogeneous plaque at the left carotid bifurcation without significant calcifications. Low resistance waveform of the left ICA. Mild tortuosity LEFT VERTEBRAL ARTERY:  Antegrade flow with low resistance waveform. IMPRESSION: Color duplex indicates minimal heterogeneous plaque, with no hemodynamically significant stenosis by duplex criteria in the extracranial cerebrovascular circulation. Signed, Yvone Neu. Loreta Ave, DO [facility] Radiology Electronically Signed   By: Gilmer Mor D.O.   On: 10/22/2016 09:22   Mr Maxine Glenn Head Wo Contrast  Result Date: 10/22/2016 CLINICAL DATA:  Altered mental status over the last 2 days. EXAM: MRI HEAD WITHOUT CONTRAST MRA HEAD WITHOUT CONTRAST TECHNIQUE: Multiplanar, multiecho pulse sequences of the brain and surrounding structures were obtained without intravenous contrast. Angiographic images of the head were obtained using MRA technique without contrast. COMPARISON:  Head CT 10/22/2016.  MRI 04/19/2006. FINDINGS: MRI HEAD FINDINGS Brain: Diffusion imaging does not show any acute or subacute infarction. There are chronic small-vessel ischemic changes of the pons. No focal cerebellar insult. Cerebral hemispheres show old small vessel infarctions in the thalami, basal ganglia and hemispheric deep white matter. Encephalomalacia in the right frontal deep white matter with mild involvement of the right insular region. No large vessel territory infarction. No mass lesion, hemorrhage, hydrocephalus or extra-axial collection. Ventricular size is in proportion to the degree of brain atrophy. Vascular: Major vessels at the base of the brain show flow. Skull and upper cervical spine: Negative Sinuses/Orbits: Clear/normal Other: None significant MRA HEAD FINDINGS Considerable motion degradation. Both  internal carotid arteries are widely patent through the skullbase and siphon regions. The anterior and middle cerebral vessels are patent bilaterally. Cannot evaluate accurately for stenotic disease given the degradation. Both vertebral arteries are patent to the basilar. The basilar appears widely patent. Major posterior circulation branch vessels are patent. Large posterior communicating arteries bilaterally. Detail not sufficient to evaluate for stenotic disease. IMPRESSION: No acute or reversible finding. Atrophy and chronic small-vessel ischemic changes. 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 Motion degraded MR angiogram. Major vessels show flow. No evidence of occlusion or severe proximal stenosis, though detail is limited by motion. Electronically Signed   By: Paulina Fusi M.D.   On: 10/22/2016 08:40    EEG: This is a normal recording of the awake and sleep states.  Echo: - Normal LV wall thickness with LVEF 55-60% and restrictive   diastolic filling pattern. Mild left atrial enlargement.   Moderately calcified mitral annulus with mild mitral   regurgitation. Moderately sclerotic to mildly stenotic aortic   valve with moderate aortic regurgitation. Mild dilated right   ventricle with mildly reduced contraction. Mild right atrial   enlargement. Mild tricuspid regurgitation with evidence of severe   pulmonary hypertension, estimated PASP 66 mmHg. Right pleural   effusion is noted.  Subjective: Feeling better. Shortness of breath improving.  Discharge Exam: Vitals:   10/24/16 2100 10/25/16 0500  BP: (!) 107/57 Marland Kitchen)  116/38  Pulse: (!) 51 64  Resp: 16 16  Temp: 98.5 F (36.9 C) 97.6 F (36.4 C)  SpO2: 98% 96%   Vitals:   10/24/16 1427 10/24/16 1959 10/24/16 2100 10/25/16 0500  BP: (!) 118/42  (!) 107/57 (!) 116/38  Pulse: 80  (!) 51 64  Resp: 20  16 16   Temp: 98.4 F (36.9 C)  98.5 F (36.9 C) 97.6 F (36.4 C)  TempSrc: Oral  Oral Oral  SpO2: 98% 92% 98% 96%  Weight:     75.6 kg (166 lb 10.7 oz)  Height:        General: Pt is alert, awake, not in acute distress Cardiovascular: RRR, S1/S2 +, no rubs, no gallops Respiratory: diminished breath sounds at right base, no wheezing Abdominal: Soft, NT, ND, bowel sounds + Extremities: no edema, no cyanosis    The results of significant diagnostics from this hospitalization (including imaging, microbiology, ancillary and laboratory) are listed below for reference.     Microbiology: No results found for this or any previous visit (from the past 240 hour(s)).   Labs: BNP (last 3 results)  Recent Labs  10/19/16 1842  BNP 1,395.0*   Basic Metabolic Panel:  Recent Labs Lab 10/21/16 0548 10/22/16 0548 10/23/16 0619 10/24/16 0628 10/25/16 0449  NA 137 139 135 137 137  K 3.0* 4.0 3.2* 3.6 3.0*  CL 98* 94* 91* 93* 92*  CO2 32 37* 34* 33* 34*  GLUCOSE 96 96 85 88 90  BUN 16 16 20  25* 30*  CREATININE 0.80 0.96 0.96 0.97 1.08*  CALCIUM 8.4* 8.9 8.9 9.0 9.1  MG  --  1.7  --   --   --    Liver Function Tests:  Recent Labs Lab 10/19/16 1833  AST 46*  ALT 90*  ALKPHOS 72  BILITOT 0.5  PROT 6.8  ALBUMIN 3.4*   No results for input(s): LIPASE, AMYLASE in the last 168 hours. No results for input(s): AMMONIA in the last 168 hours. CBC:  Recent Labs Lab 10/19/16 1833 10/22/16 0548  WBC 7.0 9.2  NEUTROABS 4.8  --   HGB 12.4 12.5  HCT 38.6 38.9  MCV 88.9 88.4  PLT 317 289   Cardiac Enzymes:  Recent Labs Lab 10/19/16 1833  TROPONINI <0.03   BNP: Invalid input(s): POCBNP CBG: No results for input(s): GLUCAP in the last 168 hours. D-Dimer No results for input(s): DDIMER in the last 72 hours. Hgb A1c No results for input(s): HGBA1C in the last 72 hours. Lipid Profile No results for input(s): CHOL, HDL, LDLCALC, TRIG, CHOLHDL, LDLDIRECT in the last 72 hours. Thyroid function studies No results for input(s): TSH, T4TOTAL, T3FREE, THYROIDAB in the last 72 hours.  Invalid input(s):  FREET3 Anemia work up  Recent Labs  10/22/16 1610  VITAMINB12 400   Urinalysis    Component Value Date/Time   COLORURINE YELLOW 10/20/2016 1454   APPEARANCEUR HAZY (A) 10/20/2016 1454   LABSPEC 1.010 10/20/2016 1454   PHURINE 6.0 10/20/2016 1454   GLUCOSEU NEGATIVE 10/20/2016 1454   HGBUR LARGE (A) 10/20/2016 1454   BILIRUBINUR NEGATIVE 10/20/2016 1454   KETONESUR NEGATIVE 10/20/2016 1454   PROTEINUR 100 (A) 10/20/2016 1454   NITRITE NEGATIVE 10/20/2016 1454   LEUKOCYTESUR MODERATE (A) 10/20/2016 1454   Sepsis Labs Invalid input(s): PROCALCITONIN,  WBC,  LACTICIDVEN Microbiology No results found for this or any previous visit (from the past 240 hour(s)).   Time coordinating discharge: Over 30 minutes  SIGNED:   MEMON,JEHANZEB,  MD  [facility] 10/25/2016, 1:30 PM Pager   If 7PM-7AM, please contact night-coverage [email or web address] Password [email or web address]

## 2016-10-25 NOTE — Progress Notes (Signed)
Physical Therapy Treatment Patient Details Name: Danielle Brooks MRN: [id number] DOB: 05-07-1928 Today's Date: 10/25/2016    History of Present Illness 54 o female with onset of AMS adn CHF with R pleural effusion noted on imaging, BLE edema and R frontal and acute subacute infarct.  PMHx:  throacic compression fractures, recent sternal fracture, osteoporosis, a-fib, CHF,     PT Comments    Patient demonstrates increased tolerance for taking steps in room and required Min assist with FWW and  Liters O2 x 15 feet, 10 feet x 2 trials demonstrating slow labored cadence with one near fall once fatigued.  Patient transferred to chair, completed BLE exercises with verbal cues and required occasional repeated verbal cues to complete functional tasks.    Follow Up Recommendations  SNF     Equipment Recommendations  None recommended by PT    Recommendations for Other Services       Precautions / Restrictions Precautions Precautions: Fall Precaution Comments: thoracic compression fractures Restrictions Weight Bearing Restrictions: No    Mobility  Bed Mobility Overal bed mobility: Needs Assistance Bed Mobility: Supine to Sit;Sit to Supine     Supine to sit: Supervision Sit to supine: Supervision      Transfers Overall transfer level: Needs assistance Equipment used: Rolling walker (2 wheeled) Transfers: Sit to/from [employer] Sit to Stand: Min assist Stand pivot transfers: Min assist          Ambulation/Gait Ambulation/Gait assistance: Min assist Ambulation Distance (Feet): 15 Feet Assistive device: Rolling walker (2 wheeled) Gait Pattern/deviations: Decreased step length - left;Decreased stance time - right;Decreased stance time - left;Decreased stride length;Decreased step length - right Gait velocity: reduced Gait velocity interpretation: Below normal speed for age/gender General Gait Details: unsteady slow labored cadence, limited secondary to SOB,  fatigue   Stairs            Wheelchair Mobility    Modified Rankin (Stroke Patients Only)       Balance Overall balance assessment: Needs assistance Sitting-balance support: Feet supported Sitting balance-Leahy Scale: Fair     Standing balance support: Bilateral upper extremity supported Standing balance-Leahy Scale: Poor                              Cognition Arousal/Alertness: Awake/alert Behavior During Therapy: WFL for tasks assessed/performed Overall Cognitive Status: Within Functional Limits for tasks assessed Area of Impairment: Following commands;Awareness;Problem solving;Attention                   Current Attention Level: Selective Memory: Decreased short-term memory         General Comments: Patient follows directions consistently, but occasionally requires repeated verbal cues      Exercises General Exercises - Lower Extremity Ankle Circles/Pumps: Seated;15 reps;Both Long Arc Quad: Seated;Both;15 reps Heel Raises: Seated;Both;15 reps    General Comments        Pertinent Vitals/Pain Pain Assessment: No/denies pain    Home Living                      Prior Function            PT Goals (current goals can now be found in the care plan section) Acute Rehab PT Goals Patient Stated Goal: Return home with spouse to assist PT Goal Formulation: With patient Time For Goal Achievement: 10/29/16 Potential to Achieve Goals: Good Progress towards PT goals: Progressing toward goals  Frequency    Min 3X/week      PT Plan Current plan remains appropriate    Co-evaluation              AM-PAC PT "6 Clicks" Daily Activity  Outcome Measure  Difficulty turning over in bed (including adjusting bedclothes, sheets and blankets)?: Unable Difficulty moving from lying on back to sitting on the side of the bed? : Unable Difficulty sitting down on and standing up from a chair with arms (e.g., wheelchair, bedside  commode, etc,.)?: Unable Help needed moving to and from a bed to chair (including a wheelchair)?: A Lot Help needed walking in hospital room?: A Lot Help needed climbing 3-5 steps with a railing? : A Lot 6 Click Score: 9    End of Session Equipment Utilized During Treatment: Gait belt Activity Tolerance: Patient limited by fatigue Patient left: in bed;with call bell/phone within reach Nurse Communication: Mobility status PT Visit Diagnosis: Unsteadiness on feet (R26.81);Other abnormalities of gait and mobility (R26.89);Muscle weakness (generalized) (M62.81)     Time: 0930-1001 PT Time Calculation (min) (ACUTE ONLY): 31 min  Charges:  $Therapeutic Activity: 23-37 mins                    G Codes:       10:23 AM, 10/26/16 Ocie Bob, MPT Physical Therapist with [facility] [phone] office [phone] mobile phone

## 2016-10-25 NOTE — Care Management Note (Addendum)
Case Management Note  Patient Details  Name: Danielle Brooks MRN: [id number] Date of Birth: 05-Sep-1928   Expected Discharge Date:  10/25/16               Expected Discharge Plan:  Skilled Nursing Facility  In-House Referral:  Clinical Social Work  Discharge planning Services  CM Consult  Post Acute Care Choice:  Home Health, Durable Medical Equipment, Resumption of Svcs/PTA Provider Choice offered to:  Patient  DME Arranged:  Oxygen DME Agency:  [facility]  HH Arranged:  RN, PT [facility] Agency:  [facility]  Status of Service:  Completed, signed off  Additional Comments: Discharging home today with resumption of HH services. Pt has refused SNF, pt's husband at the bedside and supportive of wife's choice. Pt/husband aware HH has 48 hrs to make resumption visit. [facility] rep, aware of DC home today and resumption order has been placed. Pt needs home oxygen. Has chosen [facility] from list of DME providers. Bonita Quin, [facility] rep, aware of referral and will obtain pt info from chart and deliver port tank to pt room prior to DC.    Addendum: Pt requests DC summary be faxed to her PCP Dr. Sherril Croon ([phone]). CM has faxed DC summary.  Malcolm Metro, RN 10/25/2016, 1:57 PM

## 2016-10-25 NOTE — Care Management (Signed)
    Durable Medical Equipment        Start     Ordered   10/25/16 1328  For home use only DME oxygen  Once    Question Answer Comment  Mode or (Route) Nasal cannula   Liters per Minute 2   Frequency Continuous (stationary and portable oxygen unit needed)   Oxygen conserving device Yes   Oxygen delivery system Gas      10/25/16 1327      Pt requires continuous supplemental oxygen at home due to alternate treatments (IV lasix) having been tried and found to be insufficient.

## 2016-10-25 NOTE — Progress Notes (Signed)
Progress Note  Patient Name: Danielle Brooks Date of Encounter: 10/25/2016  Primary Cardiologist: Danielle Sidle, MD  Subjective   Up walking in the hall with PT. Feels better, breathing and LEE improved. Wants to go home.   Inpatient Medications    Scheduled Meds: . flecainide  100 mg Oral BID  . furosemide  40 mg Oral Daily  . metoprolol succinate  50 mg Oral BID  . potassium chloride  10 mEq Oral Once per day on Sun Tue Thu Sat  . potassium chloride  20 mEq Oral Q M,W,F  . potassium chloride  40 mEq Oral Once  . traZODone  100 mg Oral QHS  . Warfarin - Pharmacist Dosing Inpatient   Does not apply q1800    PRN Meds: acetaminophen **OR** acetaminophen, albuterol, LORazepam, ondansetron **OR** ondansetron (ZOFRAN) IV   Vital Signs    Vitals:   10/24/16 1427 10/24/16 1959 10/24/16 2100 10/25/16 0500  BP: (!) 118/42  (!) 107/57 (!) 116/38  Pulse: 80  (!) 51 64  Resp: 20  16 16   Temp: 98.4 F (36.9 C)  98.5 F (36.9 C) 97.6 F (36.4 C)  TempSrc: Oral  Oral Oral  SpO2: 98% 92% 98% 96%  Weight:    166 lb 10.7 oz (75.6 kg)  Height:        Intake/Output Summary (Last 24 hours) at 10/25/16 0916 Last data filed at 10/25/16 0500  Gross per 24 hour  Intake              240 ml  Output             2400 ml  Net            -2160 ml   Filed Weights   10/23/16 0523 10/24/16 0502 10/25/16 0500  Weight: 169 lb 14.4 oz (77.1 kg) 168 lb 4.8 oz (76.3 kg) 166 lb 10.7 oz (75.6 kg)    Telemetry    NSR rates in the 60's. Even with walking in the hall with PT Personally Reviewed  Physical Exam   GEN: No acute distress.   Neck: No JVD Cardiac: IRRR, no murmurs, rubs, or gallops.  Respiratory: Clear to auscultation bilaterally. GI: Soft, nontender, non-distended  MS: No edema; No deformity.  Labs    Chemistry Recent Labs Lab 10/19/16 1833  10/23/16 7622 10/24/16 0628 10/25/16 0449  NA 139  < > 135 137 137  K 4.0  < > 3.2* 3.6 3.0*  CL 101  < > 91* 93* 92*  CO2  28  < > 34* 33* 34*  GLUCOSE 103*  < > 85 88 90  BUN 19  < > 20 25* 30*  CREATININE 0.87  < > 0.96 0.97 1.08*  CALCIUM 8.8*  < > 8.9 9.0 9.1  PROT 6.8  --   --   --   --   ALBUMIN 3.4*  --   --   --   --   AST 46*  --   --   --   --   ALT 90*  --   --   --   --   ALKPHOS 72  --   --   --   --   BILITOT 0.5  --   --   --   --   GFRNONAA 59*  < > 52* 51* 45*  GFRAA >60  < > >60 60* 52*  ANIONGAP 10  < > 10 11 11   < > =  values in this interval not displayed.   Hematology Recent Labs Lab 10/19/16 1833 10/22/16 0548  WBC 7.0 9.2  RBC 4.34 4.40  HGB 12.4 12.5  HCT 38.6 38.9  MCV 88.9 88.4  MCH 28.6 28.4  MCHC 32.1 32.1  RDW 14.5 14.3  PLT 317 289    Cardiac Enzymes Recent Labs Lab 10/19/16 1833  TROPONINI <0.03    BNP Recent Labs Lab 10/19/16 1842  BNP 1,395.0*     Radiology   Chest x-ray 10/25/2016: IMPRESSION: Cardiomegaly.  Small to moderate right pleural effusion with right lower lobe atelectasis or pneumonia.  Cardiac Studies   Echocardiogram (10/20/16):  - Left ventricle: The cavity size was normal. Wall thickness was normal. Systolic function was normal. The estimated ejection fraction was in the range of 55% to 60%. Wall motion was normal; there were no regional wall motion abnormalities. Doppler parameters are consistent with restrictive physiology, indicative of decreased left ventricular diastolic compliance and/or increased left atrial pressure. - Aortic valve: Moderately calcified annulus. Trileaflet; moderately calcified leaflets. There was moderate regurgitation. Mean gradient (S): 7 mm Hg. Peak gradient (S): 14 mm Hg. VTI ratio of LVOT to aortic valve: 0.52. Moderately sclerotic to mildly stenotic aortic valve. Valve area (Vmax): 1.66 cm^2. Valve area (Vmean): 1.68 cm^2. - Mitral valve: Moderately calcified annulus. There was mild regurgitation. - Left atrium: The atrium was mildly dilated. - Right ventricle:  The cavity size was mildly dilated. Systolic function was mildly reduced. - Right atrium: The atrium was mildly dilated. Central venous pressure (est): 8 mm Hg. - Atrial septum: No defect or patent foramen ovale was identified. - Tricuspid valve: There was mild regurgitation. - Pulmonary arteries: Systolic pressure was severely increased. PA peak pressure: 66 mm Hg (S). - Pericardium, extracardiac: There was a right pleural effusion.  Impressions:  - Normal LV wall thickness with LVEF 55-60% and restrictive diastolic filling pattern. Mild left atrial enlargement. Moderately calcified mitral annulus with mild mitral regurgitation. Moderately sclerotic to mildly stenotic aortic valve with moderate aortic regurgitation. Mild dilated right ventricle with mildly reduced contraction. Mild right atrial enlargement. Mild tricuspid regurgitation with evidence of severe pulmonary hypertension, estimated PASP 66 mmHg. Right pleural effusion is noted.  Patient Profile     83 y.o. female history of paroxysmal atrial fibrillation, TIA, COPD, and hypertension admitted with acute on chronic diastolic heart failure.  Assessment & Plan    1. Acute on Chronic Diastolic CHF:  Has diuresed 11.1 liters since admission on Lasix and is now back on po 40 mg daily. She is hypokalemic but this is being replaced. Creatinine 1.08. Appears euvolemic this am. Appointment for follow up is already scheduled on Sept 10 at 3 pm. Removing foley catheter. UA.   2. Atrial fib:  Now NSR,  rate controlled on flecainide. On warfarin with today;s INR 1.18. Being followed by pharmacy.   3. Hypertension: BP is well controlled. No changes.   Signed, Danielle Brooks. Danielle Brooks, ANP, AACC   10/25/2016, 9:16 AM   Attending note:  Patient out of room, has been walking with PT and had a follow-up chest x-ray this morning. I discussed the case with Danielle Brooks. Danielle Brooks has had significant clinical  improvement with substantial diuresis, now on oral Lasix and tolerating well. She is also maintaining sinus rhythm on flecainide. She does have a small to moderate residual right pleural effusion which will hopefully continue to improve on Lasix as an outpatient. At this point no further inpatient cardiac  workup is anticipated. We will plan to sign off, she should keep her scheduled follow-up visit already set for September 10.  Danielle Brooks, M.D., F.A.C.C.

## 2016-10-25 NOTE — Progress Notes (Signed)
ANTICOAGULATION CONSULT NOTE  Pharmacy Consult for Warfarin Indication: atrial fibrillation  Allergies  Allergen Reactions  . Bactrim [Sulfamethoxazole-Trimethoprim] Rash  . Codeine Itching  . Novocain [Procaine Hcl] Other (See Comments)    Sob and passed out years ago at dentist office   Patient Measurements: Height: 5\' 2"  (157.5 cm) Weight: 166 lb 10.7 oz (75.6 kg) IBW/kg (Calculated) : 50.1 HEPARIN DW (KG): 69.7   Vital Signs: Temp: 97.6 F (36.4 C) (08/27 0500) Temp Source: Oral (08/27 0500) BP: 116/38 (08/27 0500) Pulse Rate: 64 (08/27 0500)  Labs:  Recent Labs  10/23/16 0619 10/24/16 0628 10/25/16 0449  LABPROT 15.7* 15.8* 15.0  INR 1.24 1.25 1.18  CREATININE 0.96 0.97 1.08*   Estimated Creatinine Clearance: 35.6 mL/min (A) (by C-G formula based on SCr of 1.08 mg/dL (H)).  Medical History: Past Medical History:  Diagnosis Date  . Atrial fibrillation (HCC)    Paroxysmal, history of normal LV and negative ischemic workup  . Mixed hyperlipidemia   . Osteoporosis   . TIA (transient ischemic attack)   . Vitamin D deficiency    Medications:  Prescriptions Prior to Admission  Medication Sig Dispense Refill Last Dose  . flecainide (TAMBOCOR) 100 MG tablet Take 1 tablet (100 mg total) by mouth 2 (two) times daily. 65 tablet 3 10/19/2016 at Unknown time  . furosemide (LASIX) 20 MG tablet Take 2 tabs (40mg ) on Monday Wednesday Friday alternating with 20 mg 1 tab (Patient taking differently: Take 20-40 mg by mouth daily. Patient changed directions to one daily (Take 2 tabs (40mg ) on Monday Wednesday Friday alternating with 20 mg (1 tab) Tuesday, Thursday, Saturday and Sunday.) 180 tablet 3 10/19/2016 at Unknown time  . metoprolol succinate (TOPROL-XL) 50 MG 24 hr tablet take 1 tablet by mouth twice a day 60 tablet 0 10/19/2016 at 800a  . potassium chloride (K-DUR) 10 MEQ tablet Take 10-20 mEq by mouth daily. With lasix:Patient changed directions to one daily (Take 2 tabs  (20mg ) on Monday Wednesday Friday alternating with 10 mg 1 tab on Tuesday, Thursday, Saturday, Sunday)  0 10/19/2016 at Unknown time  . traZODone (DESYREL) 100 MG tablet take 1/2 tablet by mouth at bedtime **MAY INCREASE TO 1 TABLET AT BEDTIME IF NEEDED** (Patient taking differently: Take 100 mg by mouth at bedtime. ) 30 tablet 2 10/18/2016 at Unknown time  . Vitamin D, Ergocalciferol, (DRISDOL) 50000 UNITS CAPS Take 50,000 Units by mouth every Tuesday.    10/19/2016 at Unknown time  . warfarin (COUMADIN) 5 MG tablet Take 5 mg by mouth every evening. Managed by [facility] office   10/18/2016 at 1700   Assessment: Okay for Protocol.  Last dose 8/20 per home med list. Dose not given on 8/22 due to increased somnolence, unable to take PO at that time. Coumadin dose restarted 8/24. INR is subtherapeutic and has trended down.    Goal of Therapy:  INR 2-3   Plan:  Warfarin 6mg  (six) PO x 1 to boost INR. Daily PT/INR Monitor for signs and symptoms of bleeding.   Valrie Hart, PharmD Clinical Pharmacist Pager:  [phone] 10/25/2016   10/25/2016,11:03 AM

## 2016-10-25 NOTE — Progress Notes (Signed)
SATURATION QUALIFICATIONS: (This note is used to comply with regulatory documentation for home oxygen)  Patient Saturations on Room Air at Rest = 97%  Patient Saturations on Room Air while Ambulating = 88%  Patient Saturations on 2 Liters of oxygen while Ambulating = 98%  Please briefly explain why patient needs home oxygen: 

## 2016-10-27 ENCOUNTER — Other Ambulatory Visit: Payer: Medicare Other

## 2016-10-28 DIAGNOSIS — I110 Hypertensive heart disease with heart failure: Secondary | ICD-10-CM | POA: Diagnosis not present

## 2016-10-28 DIAGNOSIS — I5032 Chronic diastolic (congestive) heart failure: Secondary | ICD-10-CM | POA: Diagnosis not present

## 2016-10-28 DIAGNOSIS — M810 Age-related osteoporosis without current pathological fracture: Secondary | ICD-10-CM | POA: Diagnosis not present

## 2016-10-28 DIAGNOSIS — M545 Low back pain: Secondary | ICD-10-CM | POA: Diagnosis not present

## 2016-10-28 DIAGNOSIS — I4891 Unspecified atrial fibrillation: Secondary | ICD-10-CM | POA: Diagnosis not present

## 2016-10-28 DIAGNOSIS — M1712 Unilateral primary osteoarthritis, left knee: Secondary | ICD-10-CM | POA: Diagnosis not present

## 2016-10-29 DIAGNOSIS — I4891 Unspecified atrial fibrillation: Secondary | ICD-10-CM | POA: Diagnosis not present

## 2016-10-29 DIAGNOSIS — M1712 Unilateral primary osteoarthritis, left knee: Secondary | ICD-10-CM | POA: Diagnosis not present

## 2016-10-29 DIAGNOSIS — I5032 Chronic diastolic (congestive) heart failure: Secondary | ICD-10-CM | POA: Diagnosis not present

## 2016-10-29 DIAGNOSIS — M545 Low back pain: Secondary | ICD-10-CM | POA: Diagnosis not present

## 2016-10-29 DIAGNOSIS — I110 Hypertensive heart disease with heart failure: Secondary | ICD-10-CM | POA: Diagnosis not present

## 2016-10-29 DIAGNOSIS — M810 Age-related osteoporosis without current pathological fracture: Secondary | ICD-10-CM | POA: Diagnosis not present

## 2016-11-02 DIAGNOSIS — I4891 Unspecified atrial fibrillation: Secondary | ICD-10-CM | POA: Diagnosis not present

## 2016-11-02 DIAGNOSIS — M545 Low back pain: Secondary | ICD-10-CM | POA: Diagnosis not present

## 2016-11-02 DIAGNOSIS — I5032 Chronic diastolic (congestive) heart failure: Secondary | ICD-10-CM | POA: Diagnosis not present

## 2016-11-02 DIAGNOSIS — M1712 Unilateral primary osteoarthritis, left knee: Secondary | ICD-10-CM | POA: Diagnosis not present

## 2016-11-02 DIAGNOSIS — I110 Hypertensive heart disease with heart failure: Secondary | ICD-10-CM | POA: Diagnosis not present

## 2016-11-02 DIAGNOSIS — M810 Age-related osteoporosis without current pathological fracture: Secondary | ICD-10-CM | POA: Diagnosis not present

## 2016-11-03 DIAGNOSIS — M810 Age-related osteoporosis without current pathological fracture: Secondary | ICD-10-CM | POA: Diagnosis not present

## 2016-11-03 DIAGNOSIS — M545 Low back pain: Secondary | ICD-10-CM | POA: Diagnosis not present

## 2016-11-03 DIAGNOSIS — I110 Hypertensive heart disease with heart failure: Secondary | ICD-10-CM | POA: Diagnosis not present

## 2016-11-03 DIAGNOSIS — M1712 Unilateral primary osteoarthritis, left knee: Secondary | ICD-10-CM | POA: Diagnosis not present

## 2016-11-03 DIAGNOSIS — I5032 Chronic diastolic (congestive) heart failure: Secondary | ICD-10-CM | POA: Diagnosis not present

## 2016-11-03 DIAGNOSIS — I4891 Unspecified atrial fibrillation: Secondary | ICD-10-CM | POA: Diagnosis not present

## 2016-11-05 DIAGNOSIS — M545 Low back pain: Secondary | ICD-10-CM | POA: Diagnosis not present

## 2016-11-05 DIAGNOSIS — I110 Hypertensive heart disease with heart failure: Secondary | ICD-10-CM | POA: Diagnosis not present

## 2016-11-05 DIAGNOSIS — M810 Age-related osteoporosis without current pathological fracture: Secondary | ICD-10-CM | POA: Diagnosis not present

## 2016-11-05 DIAGNOSIS — I4891 Unspecified atrial fibrillation: Secondary | ICD-10-CM | POA: Diagnosis not present

## 2016-11-05 DIAGNOSIS — M1712 Unilateral primary osteoarthritis, left knee: Secondary | ICD-10-CM | POA: Diagnosis not present

## 2016-11-05 DIAGNOSIS — I5032 Chronic diastolic (congestive) heart failure: Secondary | ICD-10-CM | POA: Diagnosis not present

## 2016-11-08 ENCOUNTER — Encounter: Payer: Self-pay | Admitting: Cardiology

## 2016-11-08 ENCOUNTER — Ambulatory Visit (INDEPENDENT_AMBULATORY_CARE_PROVIDER_SITE_OTHER): Payer: Medicare Other | Admitting: Cardiology

## 2016-11-08 VITALS — BP 126/60 | HR 51 | Ht 62.0 in | Wt 159.2 lb

## 2016-11-08 DIAGNOSIS — M545 Low back pain: Secondary | ICD-10-CM | POA: Diagnosis not present

## 2016-11-08 DIAGNOSIS — I480 Paroxysmal atrial fibrillation: Secondary | ICD-10-CM

## 2016-11-08 DIAGNOSIS — M1712 Unilateral primary osteoarthritis, left knee: Secondary | ICD-10-CM | POA: Diagnosis not present

## 2016-11-08 DIAGNOSIS — Z8673 Personal history of transient ischemic attack (TIA), and cerebral infarction without residual deficits: Secondary | ICD-10-CM

## 2016-11-08 DIAGNOSIS — I639 Cerebral infarction, unspecified: Secondary | ICD-10-CM

## 2016-11-08 DIAGNOSIS — I5032 Chronic diastolic (congestive) heart failure: Secondary | ICD-10-CM

## 2016-11-08 DIAGNOSIS — E782 Mixed hyperlipidemia: Secondary | ICD-10-CM | POA: Diagnosis not present

## 2016-11-08 DIAGNOSIS — I4891 Unspecified atrial fibrillation: Secondary | ICD-10-CM | POA: Diagnosis not present

## 2016-11-08 DIAGNOSIS — M810 Age-related osteoporosis without current pathological fracture: Secondary | ICD-10-CM | POA: Diagnosis not present

## 2016-11-08 DIAGNOSIS — I110 Hypertensive heart disease with heart failure: Secondary | ICD-10-CM | POA: Diagnosis not present

## 2016-11-08 NOTE — Progress Notes (Signed)
Cardiology Office Note  Date: 11/08/2016   ID: Danielle Brooks, DOB 1929/01/13, MRN [id number]  PCP: Ignatius SpeckingVyas, Dhruv B, MD  Primary Cardiologist: Nona DellSamuel McDowell, MD   Chief Complaint  Patient presents with  . Diastolic heart failure    History of Present Illness: Danielle Brooks is an 83 y.o. female that I saw recently in August and subsequently recommended hospitalization for evidence of fluid overload and heart failure symptoms. She was admitted to [facility] and underwent IV diuresis for acute on chronic diastolic heart failure with restrictive filling pattern by follow-up echocardiogram. She diuresed nearly 11 L with significant improvement in symptoms and was transitioned to Lasix at 40 mg daily for discharge. She did not have any recurrent atrial fibrillation under observation.  She is here with her husband for follow-up visit. States that she is doing much better overall. Still having home physical therapy with weakness, but her breathing is much better. She has had no palpitations or chest pain.  We went over her medications. She remains on Lasix at 40 mg daily with potassium supplements. Weight has been relatively stable fluctuating by a few pounds but not steadily increasing.  Heart rate in the 50s today but she is asymptomatic. No dizziness or syncope. She has been on current dose of Toprol-XL for a while.  Past Medical History:  Diagnosis Date  . Atrial fibrillation (HCC)    Paroxysmal, history of normal LV and negative ischemic workup  . Mixed hyperlipidemia   . Osteoporosis   . TIA (transient ischemic attack)   . Vitamin D deficiency     History reviewed. No pertinent surgical history.  Current Outpatient Prescriptions  Medication Sig Dispense Refill  . flecainide (TAMBOCOR) 100 MG tablet Take 1 tablet (100 mg total) by mouth 2 (two) times daily. 65 tablet 3  . furosemide (LASIX) 40 MG tablet Take 1 tablet (40 mg total) by mouth daily. 30 tablet 0  . metoprolol  succinate (TOPROL-XL) 50 MG 24 hr tablet take 1 tablet by mouth twice a day 60 tablet 0  . potassium chloride (K-DUR) 10 MEQ tablet Take 10-20 mEq by mouth daily. With lasix:Patient changed directions to one daily (Take 2 tabs (20mg ) on Monday Wednesday Friday alternating with 10 mg 1 tab on Tuesday, Thursday, Saturday, Sunday)  0  . traZODone (DESYREL) 100 MG tablet Take 100 mg by mouth at bedtime.    . Vitamin D, Ergocalciferol, (DRISDOL) 50000 UNITS CAPS Take 50,000 Units by mouth every Tuesday.     . warfarin (COUMADIN) 5 MG tablet Take 5 mg by mouth every evening. Managed by [facility] office     No current facility-administered medications for this visit.    Allergies:  Bactrim [sulfamethoxazole-trimethoprim]; Codeine; and Novocain [procaine hcl]   Social History: The patient  reports that she has never smoked. She has never used smokeless tobacco. She reports that she does not drink alcohol or use drugs.   ROS:  Please see the history of present illness. Otherwise, complete review of systems is positive for frequent urination.  All other systems are reviewed and negative.   Physical Exam: VS:  BP 126/60   Pulse (!) 51   Ht 5\' 2"  (1.575 m)   Wt 159 lb 3.2 oz (72.2 kg)   SpO2 97%   BMI 29.12 kg/m , BMI Body mass index is 29.12 kg/m.  Wt Readings from Last 3 Encounters:  11/08/16 159 lb 3.2 oz (72.2 kg)  10/25/16 166 lb 10.7 oz (  75.6 kg)  10/12/16 180 lb (81.6 kg)    Elderly woman, no distress.Sitting in a wheelchair. HEENT: Conjunctiva and lids normal, oropharynx clear.  Neck: Supple, no elevated JVP or carotid bruits, no thyromegaly.  Lungs: Decreased breath sounds at the bases, nonlabored breathing at rest.  Cardiac: RRR, no S3 or significant systolic murmur, no pericardial rub.  Abdomen: Soft, nontender, bowel sounds present.  Extremities: 1+ leg edema, distal pulses 2+. Skin: Warm and dry. Musculoskeletal: No kyphosis. Neuropsychiatric: Alert and oriented 3, affect  appropriate.  ECG: I personally reviewed the tracing from 10/19/20 which showed sinus rhythm with prolonged PR interval, borderline IVCD and nonspecific ST changes.  Recent Labwork: 10/19/2016: ALT 90; AST 46; B Natriuretic Peptide 1,395.0; TSH 2.014 10/22/2016: Hemoglobin 12.5; Magnesium 1.7; Platelets 289 10/25/2016: BUN 30; Creatinine, Ser 1.08; Potassium 3.0; Sodium 137     Component Value Date/Time   CHOL 128 10/22/2016 0548   TRIG 85 10/22/2016 0548   HDL 39 (L) 10/22/2016 0548   CHOLHDL 3.3 10/22/2016 0548   VLDL 17 10/22/2016 0548   LDLCALC 72 10/22/2016 0548    Other Studies Reviewed Today:  Echocardiogram 10/20/2016: Study Conclusions  - Left ventricle: The cavity size was normal. Wall thickness was   normal. Systolic function was normal. The estimated ejection   fraction was in the range of 55% to 60%. Wall motion was normal;   there were no regional wall motion abnormalities. Doppler   parameters are consistent with restrictive physiology, indicative   of decreased left ventricular diastolic compliance and/or   increased left atrial pressure. - Aortic valve: Moderately calcified annulus. Trileaflet;   moderately calcified leaflets. There was moderate regurgitation.   Mean gradient (S): 7 mm Hg. Peak gradient (S): 14 mm Hg. VTI   ratio of LVOT to aortic valve: 0.52. Moderately sclerotic to   mildly stenotic aortic valve. Valve area (Vmax): 1.66 cm^2. Valve   area (Vmean): 1.68 cm^2. - Mitral valve: Moderately calcified annulus. There was mild   regurgitation. - Left atrium: The atrium was mildly dilated. - Right ventricle: The cavity size was mildly dilated. Systolic   function was mildly reduced. - Right atrium: The atrium was mildly dilated. Central venous   pressure (est): 8 mm Hg. - Atrial septum: No defect or patent foramen ovale was identified. - Tricuspid valve: There was mild regurgitation. - Pulmonary arteries: Systolic pressure was severely increased.  PA   peak pressure: 66 mm Hg (S). - Pericardium, extracardiac: There was a right pleural effusion.  Impressions:  - Normal LV wall thickness with LVEF 55-60% and restrictive   diastolic filling pattern. Mild left atrial enlargement.   Moderately calcified mitral annulus with mild mitral   regurgitation. Moderately sclerotic to mildly stenotic aortic   valve with moderate aortic regurgitation. Mild dilated right   ventricle with mildly reduced contraction. Mild right atrial   enlargement. Mild tricuspid regurgitation with evidence of severe   pulmonary hypertension, estimated PASP 66 mmHg. Right pleural   effusion is noted.  Carotid Dopplers 10/22/2016: IMPRESSION: Color duplex indicates minimal heterogeneous plaque, with no hemodynamically significant stenosis by duplex criteria in the extracranial cerebrovascular circulation.  Assessment and Plan:  1. Chronic diastolic heart failure, symptomatically improved after substantial diuresis with recent hospital stay. Continue to monitor weights daily. She will remain on Lasix at 40 mg daily with potassium supplements. She knows to double her dose if weight goes up by 2-3 pounds in 24 hours or 5 pounds in one week. Check BMET for  next visit.  2. Paroxysmal atrial fibrillation, holding sinus rhythm. Continue Coumadin, Toprol-XL, and flecainide. May need to back down dose of beta blocker bradycardia progresses.  3. Diet managed hyperlipidemia. Keep follow-up with Dr. Sherril Croon. Recent LDL 72.  4. History of TIA. She had recent carotid Dopplers that did not demonstrate any significant stenoses.  Current medicines were reviewed with the patient today.   Orders Placed This Encounter  Procedures  . Basic Metabolic Panel (BMET)    Disposition: Follow-up in 6 weeks.  Signed, Jonelle Sidle, MD, [facility] 11/08/2016 3:33 PM    [facility] [address] [place], [place], [place] [address] Phone: [phone]; Fax:  [phone]

## 2016-11-08 NOTE — Patient Instructions (Signed)
Your physician recommends that you schedule a follow-up appointment in: 6 WEEKS WITH DR MCDOWELL  Your physician recommends that you continue on your current medications as directed. Please refer to the Current Medication list given to you today.  Your physician recommends that you return for lab work JUST BEFORE YOUR NEXT APPOINTMENT BMP - WE HAVE GIVEN YOU LAB ORDERS TODAY  Thank you for choosing [facility]!!

## 2016-11-09 DIAGNOSIS — I5032 Chronic diastolic (congestive) heart failure: Secondary | ICD-10-CM | POA: Diagnosis not present

## 2016-11-09 DIAGNOSIS — M1712 Unilateral primary osteoarthritis, left knee: Secondary | ICD-10-CM | POA: Diagnosis not present

## 2016-11-09 DIAGNOSIS — I4891 Unspecified atrial fibrillation: Secondary | ICD-10-CM | POA: Diagnosis not present

## 2016-11-09 DIAGNOSIS — I110 Hypertensive heart disease with heart failure: Secondary | ICD-10-CM | POA: Diagnosis not present

## 2016-11-09 DIAGNOSIS — M810 Age-related osteoporosis without current pathological fracture: Secondary | ICD-10-CM | POA: Diagnosis not present

## 2016-11-09 DIAGNOSIS — M545 Low back pain: Secondary | ICD-10-CM | POA: Diagnosis not present

## 2016-11-10 DIAGNOSIS — M1712 Unilateral primary osteoarthritis, left knee: Secondary | ICD-10-CM | POA: Diagnosis not present

## 2016-11-10 DIAGNOSIS — M545 Low back pain: Secondary | ICD-10-CM | POA: Diagnosis not present

## 2016-11-10 DIAGNOSIS — I5032 Chronic diastolic (congestive) heart failure: Secondary | ICD-10-CM | POA: Diagnosis not present

## 2016-11-10 DIAGNOSIS — I4891 Unspecified atrial fibrillation: Secondary | ICD-10-CM | POA: Diagnosis not present

## 2016-11-10 DIAGNOSIS — I110 Hypertensive heart disease with heart failure: Secondary | ICD-10-CM | POA: Diagnosis not present

## 2016-11-10 DIAGNOSIS — M810 Age-related osteoporosis without current pathological fracture: Secondary | ICD-10-CM | POA: Diagnosis not present

## 2016-11-11 DIAGNOSIS — M810 Age-related osteoporosis without current pathological fracture: Secondary | ICD-10-CM | POA: Diagnosis not present

## 2016-11-11 DIAGNOSIS — M545 Low back pain: Secondary | ICD-10-CM | POA: Diagnosis not present

## 2016-11-11 DIAGNOSIS — M1712 Unilateral primary osteoarthritis, left knee: Secondary | ICD-10-CM | POA: Diagnosis not present

## 2016-11-11 DIAGNOSIS — I5032 Chronic diastolic (congestive) heart failure: Secondary | ICD-10-CM | POA: Diagnosis not present

## 2016-11-11 DIAGNOSIS — I4891 Unspecified atrial fibrillation: Secondary | ICD-10-CM | POA: Diagnosis not present

## 2016-11-11 DIAGNOSIS — I110 Hypertensive heart disease with heart failure: Secondary | ICD-10-CM | POA: Diagnosis not present

## 2016-11-12 DIAGNOSIS — M810 Age-related osteoporosis without current pathological fracture: Secondary | ICD-10-CM | POA: Diagnosis not present

## 2016-11-12 DIAGNOSIS — I110 Hypertensive heart disease with heart failure: Secondary | ICD-10-CM | POA: Diagnosis not present

## 2016-11-12 DIAGNOSIS — M1712 Unilateral primary osteoarthritis, left knee: Secondary | ICD-10-CM | POA: Diagnosis not present

## 2016-11-12 DIAGNOSIS — I4891 Unspecified atrial fibrillation: Secondary | ICD-10-CM | POA: Diagnosis not present

## 2016-11-12 DIAGNOSIS — I5032 Chronic diastolic (congestive) heart failure: Secondary | ICD-10-CM | POA: Diagnosis not present

## 2016-11-12 DIAGNOSIS — M545 Low back pain: Secondary | ICD-10-CM | POA: Diagnosis not present

## 2016-11-15 ENCOUNTER — Telehealth: Payer: Self-pay | Admitting: *Deleted

## 2016-11-15 DIAGNOSIS — I4891 Unspecified atrial fibrillation: Secondary | ICD-10-CM | POA: Diagnosis not present

## 2016-11-15 DIAGNOSIS — M545 Low back pain: Secondary | ICD-10-CM | POA: Diagnosis not present

## 2016-11-15 DIAGNOSIS — M1712 Unilateral primary osteoarthritis, left knee: Secondary | ICD-10-CM | POA: Diagnosis not present

## 2016-11-15 DIAGNOSIS — I5032 Chronic diastolic (congestive) heart failure: Secondary | ICD-10-CM | POA: Diagnosis not present

## 2016-11-15 DIAGNOSIS — I110 Hypertensive heart disease with heart failure: Secondary | ICD-10-CM | POA: Diagnosis not present

## 2016-11-15 DIAGNOSIS — M810 Age-related osteoporosis without current pathological fracture: Secondary | ICD-10-CM | POA: Diagnosis not present

## 2016-11-15 NOTE — Telephone Encounter (Signed)
Would generally need to double Lasix for 2-3 days to see her weight come back down again. As far as the Toprol-XL goes, last note indicates 50 mg twice daily. Would change to 50 mg in the morning and 25 mg in the evening.

## 2016-11-15 NOTE — Telephone Encounter (Signed)
Danielle Brooks with [facility] calling to report weight gain of 5 pounds in 1 week.  Patient last seen in office on 11/08/2016.  Weight at OV was 159.  Weight today is 165.4.  Also reporting that her heart rate has been consistently 50 since getting home from hospital.  No c/o dizziness, syncope, or chest pain.  Per OV dictation, states for patient to double up on her Lasix if weight goes up by 2-3 pounds in 24 hours or 5 pounds in one week.  Patient has not done this yet.  Please clarify how many days you would like her to double on the fluid medication & if any changes need to be made to her Toprol XL due to her heart rate.

## 2016-11-16 DIAGNOSIS — M1712 Unilateral primary osteoarthritis, left knee: Secondary | ICD-10-CM | POA: Diagnosis not present

## 2016-11-16 DIAGNOSIS — I5032 Chronic diastolic (congestive) heart failure: Secondary | ICD-10-CM | POA: Diagnosis not present

## 2016-11-16 DIAGNOSIS — I4891 Unspecified atrial fibrillation: Secondary | ICD-10-CM | POA: Diagnosis not present

## 2016-11-16 DIAGNOSIS — I110 Hypertensive heart disease with heart failure: Secondary | ICD-10-CM | POA: Diagnosis not present

## 2016-11-16 DIAGNOSIS — M545 Low back pain: Secondary | ICD-10-CM | POA: Diagnosis not present

## 2016-11-16 DIAGNOSIS — M810 Age-related osteoporosis without current pathological fracture: Secondary | ICD-10-CM | POA: Diagnosis not present

## 2016-11-16 MED ORDER — METOPROLOL SUCCINATE ER 50 MG PO TB24: ORAL_TABLET | ORAL | Status: DC

## 2016-11-16 MED ORDER — FUROSEMIDE 40 MG PO TABS: 40.0000 mg | ORAL_TABLET | Freq: Every day | ORAL | Status: DC

## 2016-11-16 NOTE — Telephone Encounter (Signed)
Joni Reining with [facility] notified.  She will notify the patient.

## 2016-11-18 DIAGNOSIS — M1712 Unilateral primary osteoarthritis, left knee: Secondary | ICD-10-CM | POA: Diagnosis not present

## 2016-11-18 DIAGNOSIS — M810 Age-related osteoporosis without current pathological fracture: Secondary | ICD-10-CM | POA: Diagnosis not present

## 2016-11-18 DIAGNOSIS — I4891 Unspecified atrial fibrillation: Secondary | ICD-10-CM | POA: Diagnosis not present

## 2016-11-18 DIAGNOSIS — I5032 Chronic diastolic (congestive) heart failure: Secondary | ICD-10-CM | POA: Diagnosis not present

## 2016-11-18 DIAGNOSIS — M545 Low back pain: Secondary | ICD-10-CM | POA: Diagnosis not present

## 2016-11-18 DIAGNOSIS — I110 Hypertensive heart disease with heart failure: Secondary | ICD-10-CM | POA: Diagnosis not present

## 2016-11-22 DIAGNOSIS — Z9181 History of falling: Secondary | ICD-10-CM | POA: Diagnosis not present

## 2016-11-22 DIAGNOSIS — Z9981 Dependence on supplemental oxygen: Secondary | ICD-10-CM | POA: Diagnosis not present

## 2016-11-22 DIAGNOSIS — F329 Major depressive disorder, single episode, unspecified: Secondary | ICD-10-CM | POA: Diagnosis not present

## 2016-11-22 DIAGNOSIS — Z7901 Long term (current) use of anticoagulants: Secondary | ICD-10-CM | POA: Diagnosis not present

## 2016-11-22 DIAGNOSIS — M545 Low back pain: Secondary | ICD-10-CM | POA: Diagnosis not present

## 2016-11-22 DIAGNOSIS — M8008XD Age-related osteoporosis with current pathological fracture, vertebra(e), subsequent encounter for fracture with routine healing: Secondary | ICD-10-CM | POA: Diagnosis not present

## 2016-11-22 DIAGNOSIS — J449 Chronic obstructive pulmonary disease, unspecified: Secondary | ICD-10-CM | POA: Diagnosis not present

## 2016-11-22 DIAGNOSIS — G912 (Idiopathic) normal pressure hydrocephalus: Secondary | ICD-10-CM | POA: Diagnosis not present

## 2016-11-22 DIAGNOSIS — I5032 Chronic diastolic (congestive) heart failure: Secondary | ICD-10-CM | POA: Diagnosis not present

## 2016-11-22 DIAGNOSIS — M1712 Unilateral primary osteoarthritis, left knee: Secondary | ICD-10-CM | POA: Diagnosis not present

## 2016-11-22 DIAGNOSIS — Z8673 Personal history of transient ischemic attack (TIA), and cerebral infarction without residual deficits: Secondary | ICD-10-CM | POA: Diagnosis not present

## 2016-11-22 DIAGNOSIS — F419 Anxiety disorder, unspecified: Secondary | ICD-10-CM | POA: Diagnosis not present

## 2016-11-22 DIAGNOSIS — I110 Hypertensive heart disease with heart failure: Secondary | ICD-10-CM | POA: Diagnosis not present

## 2016-11-22 DIAGNOSIS — I480 Paroxysmal atrial fibrillation: Secondary | ICD-10-CM | POA: Diagnosis not present

## 2016-11-23 DIAGNOSIS — I5032 Chronic diastolic (congestive) heart failure: Secondary | ICD-10-CM | POA: Diagnosis not present

## 2016-11-23 DIAGNOSIS — M545 Low back pain: Secondary | ICD-10-CM | POA: Diagnosis not present

## 2016-11-23 DIAGNOSIS — J449 Chronic obstructive pulmonary disease, unspecified: Secondary | ICD-10-CM | POA: Diagnosis not present

## 2016-11-23 DIAGNOSIS — Z9181 History of falling: Secondary | ICD-10-CM | POA: Diagnosis not present

## 2016-11-23 DIAGNOSIS — I480 Paroxysmal atrial fibrillation: Secondary | ICD-10-CM | POA: Diagnosis not present

## 2016-11-23 DIAGNOSIS — I110 Hypertensive heart disease with heart failure: Secondary | ICD-10-CM | POA: Diagnosis not present

## 2016-11-24 DIAGNOSIS — Z9181 History of falling: Secondary | ICD-10-CM | POA: Diagnosis not present

## 2016-11-24 DIAGNOSIS — J449 Chronic obstructive pulmonary disease, unspecified: Secondary | ICD-10-CM | POA: Diagnosis not present

## 2016-11-24 DIAGNOSIS — I5032 Chronic diastolic (congestive) heart failure: Secondary | ICD-10-CM | POA: Diagnosis not present

## 2016-11-24 DIAGNOSIS — I480 Paroxysmal atrial fibrillation: Secondary | ICD-10-CM | POA: Diagnosis not present

## 2016-11-24 DIAGNOSIS — M545 Low back pain: Secondary | ICD-10-CM | POA: Diagnosis not present

## 2016-11-24 DIAGNOSIS — I110 Hypertensive heart disease with heart failure: Secondary | ICD-10-CM | POA: Diagnosis not present

## 2016-11-26 DIAGNOSIS — I110 Hypertensive heart disease with heart failure: Secondary | ICD-10-CM | POA: Diagnosis not present

## 2016-11-26 DIAGNOSIS — Z9181 History of falling: Secondary | ICD-10-CM | POA: Diagnosis not present

## 2016-11-26 DIAGNOSIS — I5032 Chronic diastolic (congestive) heart failure: Secondary | ICD-10-CM | POA: Diagnosis not present

## 2016-11-26 DIAGNOSIS — M545 Low back pain: Secondary | ICD-10-CM | POA: Diagnosis not present

## 2016-11-26 DIAGNOSIS — J449 Chronic obstructive pulmonary disease, unspecified: Secondary | ICD-10-CM | POA: Diagnosis not present

## 2016-11-26 DIAGNOSIS — I480 Paroxysmal atrial fibrillation: Secondary | ICD-10-CM | POA: Diagnosis not present

## 2016-11-30 DIAGNOSIS — J449 Chronic obstructive pulmonary disease, unspecified: Secondary | ICD-10-CM | POA: Diagnosis not present

## 2016-11-30 DIAGNOSIS — I5032 Chronic diastolic (congestive) heart failure: Secondary | ICD-10-CM | POA: Diagnosis not present

## 2016-11-30 DIAGNOSIS — Z9181 History of falling: Secondary | ICD-10-CM | POA: Diagnosis not present

## 2016-11-30 DIAGNOSIS — M545 Low back pain: Secondary | ICD-10-CM | POA: Diagnosis not present

## 2016-11-30 DIAGNOSIS — I110 Hypertensive heart disease with heart failure: Secondary | ICD-10-CM | POA: Diagnosis not present

## 2016-11-30 DIAGNOSIS — I480 Paroxysmal atrial fibrillation: Secondary | ICD-10-CM | POA: Diagnosis not present

## 2016-12-01 DIAGNOSIS — M545 Low back pain: Secondary | ICD-10-CM | POA: Diagnosis not present

## 2016-12-01 DIAGNOSIS — J449 Chronic obstructive pulmonary disease, unspecified: Secondary | ICD-10-CM | POA: Diagnosis not present

## 2016-12-01 DIAGNOSIS — Z9181 History of falling: Secondary | ICD-10-CM | POA: Diagnosis not present

## 2016-12-01 DIAGNOSIS — I110 Hypertensive heart disease with heart failure: Secondary | ICD-10-CM | POA: Diagnosis not present

## 2016-12-01 DIAGNOSIS — I480 Paroxysmal atrial fibrillation: Secondary | ICD-10-CM | POA: Diagnosis not present

## 2016-12-01 DIAGNOSIS — I5032 Chronic diastolic (congestive) heart failure: Secondary | ICD-10-CM | POA: Diagnosis not present

## 2016-12-03 DIAGNOSIS — I110 Hypertensive heart disease with heart failure: Secondary | ICD-10-CM | POA: Diagnosis not present

## 2016-12-03 DIAGNOSIS — J449 Chronic obstructive pulmonary disease, unspecified: Secondary | ICD-10-CM | POA: Diagnosis not present

## 2016-12-03 DIAGNOSIS — I5032 Chronic diastolic (congestive) heart failure: Secondary | ICD-10-CM | POA: Diagnosis not present

## 2016-12-03 DIAGNOSIS — I480 Paroxysmal atrial fibrillation: Secondary | ICD-10-CM | POA: Diagnosis not present

## 2016-12-03 DIAGNOSIS — Z9181 History of falling: Secondary | ICD-10-CM | POA: Diagnosis not present

## 2016-12-03 DIAGNOSIS — M545 Low back pain: Secondary | ICD-10-CM | POA: Diagnosis not present

## 2016-12-07 DIAGNOSIS — Z9181 History of falling: Secondary | ICD-10-CM | POA: Diagnosis not present

## 2016-12-07 DIAGNOSIS — J449 Chronic obstructive pulmonary disease, unspecified: Secondary | ICD-10-CM | POA: Diagnosis not present

## 2016-12-07 DIAGNOSIS — I5032 Chronic diastolic (congestive) heart failure: Secondary | ICD-10-CM | POA: Diagnosis not present

## 2016-12-07 DIAGNOSIS — M545 Low back pain: Secondary | ICD-10-CM | POA: Diagnosis not present

## 2016-12-07 DIAGNOSIS — I480 Paroxysmal atrial fibrillation: Secondary | ICD-10-CM | POA: Diagnosis not present

## 2016-12-07 DIAGNOSIS — I110 Hypertensive heart disease with heart failure: Secondary | ICD-10-CM | POA: Diagnosis not present

## 2016-12-08 DIAGNOSIS — J449 Chronic obstructive pulmonary disease, unspecified: Secondary | ICD-10-CM | POA: Diagnosis not present

## 2016-12-08 DIAGNOSIS — I5032 Chronic diastolic (congestive) heart failure: Secondary | ICD-10-CM | POA: Diagnosis not present

## 2016-12-08 DIAGNOSIS — M545 Low back pain: Secondary | ICD-10-CM | POA: Diagnosis not present

## 2016-12-08 DIAGNOSIS — I110 Hypertensive heart disease with heart failure: Secondary | ICD-10-CM | POA: Diagnosis not present

## 2016-12-08 DIAGNOSIS — Z9181 History of falling: Secondary | ICD-10-CM | POA: Diagnosis not present

## 2016-12-08 DIAGNOSIS — I480 Paroxysmal atrial fibrillation: Secondary | ICD-10-CM | POA: Diagnosis not present

## 2016-12-10 DIAGNOSIS — M545 Low back pain: Secondary | ICD-10-CM | POA: Diagnosis not present

## 2016-12-10 DIAGNOSIS — J449 Chronic obstructive pulmonary disease, unspecified: Secondary | ICD-10-CM | POA: Diagnosis not present

## 2016-12-10 DIAGNOSIS — I480 Paroxysmal atrial fibrillation: Secondary | ICD-10-CM | POA: Diagnosis not present

## 2016-12-10 DIAGNOSIS — Z9181 History of falling: Secondary | ICD-10-CM | POA: Diagnosis not present

## 2016-12-10 DIAGNOSIS — I5032 Chronic diastolic (congestive) heart failure: Secondary | ICD-10-CM | POA: Diagnosis not present

## 2016-12-10 DIAGNOSIS — I110 Hypertensive heart disease with heart failure: Secondary | ICD-10-CM | POA: Diagnosis not present

## 2016-12-13 DIAGNOSIS — I480 Paroxysmal atrial fibrillation: Secondary | ICD-10-CM | POA: Diagnosis not present

## 2016-12-13 DIAGNOSIS — I110 Hypertensive heart disease with heart failure: Secondary | ICD-10-CM | POA: Diagnosis not present

## 2016-12-13 DIAGNOSIS — J449 Chronic obstructive pulmonary disease, unspecified: Secondary | ICD-10-CM | POA: Diagnosis not present

## 2016-12-13 DIAGNOSIS — Z9181 History of falling: Secondary | ICD-10-CM | POA: Diagnosis not present

## 2016-12-13 DIAGNOSIS — I5032 Chronic diastolic (congestive) heart failure: Secondary | ICD-10-CM | POA: Diagnosis not present

## 2016-12-13 DIAGNOSIS — M545 Low back pain: Secondary | ICD-10-CM | POA: Diagnosis not present

## 2016-12-14 DIAGNOSIS — I480 Paroxysmal atrial fibrillation: Secondary | ICD-10-CM | POA: Diagnosis not present

## 2016-12-14 DIAGNOSIS — Z9181 History of falling: Secondary | ICD-10-CM | POA: Diagnosis not present

## 2016-12-14 DIAGNOSIS — J449 Chronic obstructive pulmonary disease, unspecified: Secondary | ICD-10-CM | POA: Diagnosis not present

## 2016-12-14 DIAGNOSIS — I5032 Chronic diastolic (congestive) heart failure: Secondary | ICD-10-CM | POA: Diagnosis not present

## 2016-12-14 DIAGNOSIS — M545 Low back pain: Secondary | ICD-10-CM | POA: Diagnosis not present

## 2016-12-14 DIAGNOSIS — I110 Hypertensive heart disease with heart failure: Secondary | ICD-10-CM | POA: Diagnosis not present

## 2016-12-16 ENCOUNTER — Other Ambulatory Visit: Payer: Self-pay | Admitting: Cardiology

## 2016-12-16 DIAGNOSIS — M545 Low back pain: Secondary | ICD-10-CM | POA: Diagnosis not present

## 2016-12-16 DIAGNOSIS — I110 Hypertensive heart disease with heart failure: Secondary | ICD-10-CM | POA: Diagnosis not present

## 2016-12-16 DIAGNOSIS — Z9181 History of falling: Secondary | ICD-10-CM | POA: Diagnosis not present

## 2016-12-16 DIAGNOSIS — I5032 Chronic diastolic (congestive) heart failure: Secondary | ICD-10-CM | POA: Diagnosis not present

## 2016-12-16 DIAGNOSIS — J449 Chronic obstructive pulmonary disease, unspecified: Secondary | ICD-10-CM | POA: Diagnosis not present

## 2016-12-16 DIAGNOSIS — I480 Paroxysmal atrial fibrillation: Secondary | ICD-10-CM | POA: Diagnosis not present

## 2016-12-23 DIAGNOSIS — I480 Paroxysmal atrial fibrillation: Secondary | ICD-10-CM | POA: Diagnosis not present

## 2016-12-23 DIAGNOSIS — Z9181 History of falling: Secondary | ICD-10-CM | POA: Diagnosis not present

## 2016-12-23 DIAGNOSIS — I110 Hypertensive heart disease with heart failure: Secondary | ICD-10-CM | POA: Diagnosis not present

## 2016-12-23 DIAGNOSIS — M545 Low back pain: Secondary | ICD-10-CM | POA: Diagnosis not present

## 2016-12-23 DIAGNOSIS — I5032 Chronic diastolic (congestive) heart failure: Secondary | ICD-10-CM | POA: Diagnosis not present

## 2016-12-23 DIAGNOSIS — J449 Chronic obstructive pulmonary disease, unspecified: Secondary | ICD-10-CM | POA: Diagnosis not present

## 2016-12-27 DIAGNOSIS — I5032 Chronic diastolic (congestive) heart failure: Secondary | ICD-10-CM | POA: Diagnosis not present

## 2016-12-29 ENCOUNTER — Telehealth: Payer: Self-pay

## 2016-12-29 DIAGNOSIS — I110 Hypertensive heart disease with heart failure: Secondary | ICD-10-CM | POA: Diagnosis not present

## 2016-12-29 DIAGNOSIS — Z9181 History of falling: Secondary | ICD-10-CM | POA: Diagnosis not present

## 2016-12-29 DIAGNOSIS — I480 Paroxysmal atrial fibrillation: Secondary | ICD-10-CM | POA: Diagnosis not present

## 2016-12-29 DIAGNOSIS — J449 Chronic obstructive pulmonary disease, unspecified: Secondary | ICD-10-CM | POA: Diagnosis not present

## 2016-12-29 DIAGNOSIS — I5032 Chronic diastolic (congestive) heart failure: Secondary | ICD-10-CM | POA: Diagnosis not present

## 2016-12-29 DIAGNOSIS — M545 Low back pain: Secondary | ICD-10-CM | POA: Diagnosis not present

## 2016-12-29 NOTE — Telephone Encounter (Signed)
Patient notified. Routed to PCP 

## 2016-12-29 NOTE — Telephone Encounter (Signed)
-----   Message from Eustace MooreLydia M Anderson, LPN sent at 16/10/960410/30/2018  7:18 AM EDT -----   ----- Message ----- From: Jonelle SidleMcDowell, Samuel G, MD Sent: 12/27/2016   3:42 PM To: Eustace MooreLydia M Anderson, LPN  Results reviewed.  Renal function and potassium normal. A copy of this test should be forwarded to Ignatius SpeckingVyas, Dhruv B, MD.

## 2016-12-30 ENCOUNTER — Ambulatory Visit (INDEPENDENT_AMBULATORY_CARE_PROVIDER_SITE_OTHER): Payer: Medicare Other | Admitting: Cardiology

## 2016-12-30 ENCOUNTER — Encounter: Payer: Self-pay | Admitting: Cardiology

## 2016-12-30 VITALS — BP 104/60 | HR 46 | Ht 62.0 in | Wt 167.6 lb

## 2016-12-30 DIAGNOSIS — I5032 Chronic diastolic (congestive) heart failure: Secondary | ICD-10-CM

## 2016-12-30 DIAGNOSIS — Z8673 Personal history of transient ischemic attack (TIA), and cerebral infarction without residual deficits: Secondary | ICD-10-CM

## 2016-12-30 DIAGNOSIS — I480 Paroxysmal atrial fibrillation: Secondary | ICD-10-CM

## 2016-12-30 DIAGNOSIS — R001 Bradycardia, unspecified: Secondary | ICD-10-CM | POA: Diagnosis not present

## 2016-12-30 DIAGNOSIS — I639 Cerebral infarction, unspecified: Secondary | ICD-10-CM

## 2016-12-30 MED ORDER — METOPROLOL SUCCINATE ER 50 MG PO TB24: 50.0000 mg | ORAL_TABLET | Freq: Every day | ORAL | 0 refills | Status: DC

## 2016-12-30 NOTE — Patient Instructions (Addendum)
Medication Instructions:  Your physician has recommended you make the following change in your medication:   Decrease Toprol XL to 50 mg once daily  Please continue all other medications as prescribed  Labwork: NONE  Testing/Procedures: NONE  Follow-Up: Your physician wants you to follow-up in: 3 MONTHS WITH DR. MCDOWELL. You will receive a reminder letter in the mail two months in advance. If you don't receive a letter, please call our office to schedule the follow-up appointment.  Any Other Special Instructions Will Be Listed Below (If Applicable).  If you need a refill on your cardiac medications before your next appointment, please call your pharmacy.

## 2016-12-30 NOTE — Progress Notes (Signed)
Cardiology Office Note  Date: 12/30/2016   ID: Danielle FallsJoy Y Stage, DOB 16-Dec-1928, MRN [id number]  PCP: Ignatius SpeckingVyas, Dhruv B, MD  Primary Cardiologist: Nona DellSamuel Roshawna Colclasure, MD   Chief Complaint  Patient presents with  . Diastolic heart failure    History of Present Illness: Danielle Brooks is an 83 y.o. female last seen in September.  She presents for a follow-up visit today with her husband.  Overall doing well although she has gained back some weight.  We went over use of additional Lasix for weight gain and leg swelling.  She is also significantly bradycardic today. They realized that she had been taking Toprol-XL at 50 mg twice daily instead of reducing the dose.  Ports fatigue, no syncope or chest pain.  She has had no palpitations.  I went over her medications and we discussed reducing Toprol-XL to 50 mg once daily.  Recent follow-up lab work showed BUN 18, creatinine 0.81, and potassium 4.5.  Past Medical History:  Diagnosis Date  . Atrial fibrillation (HCC)    Paroxysmal, history of normal LV and negative ischemic workup  . Mixed hyperlipidemia   . Osteoporosis   . TIA (transient ischemic attack)   . Vitamin D deficiency     History reviewed. No pertinent surgical history.  Current Outpatient Prescriptions  Medication Sig Dispense Refill  . flecainide (TAMBOCOR) 100 MG tablet Take 1 tablet (100 mg total) by mouth 2 (two) times daily. 65 tablet 3  . furosemide (LASIX) 20 MG tablet TAKE 2 TABLETS (40MG ) ON MONDAY, WEDNESDAY AND FRIDAY , OTHER DAYS TAKE ONE 20MG  TABLET 180 tablet 0  . potassium chloride (K-DUR) 10 MEQ tablet Take 10-20 mEq by mouth daily. With lasix:Patient changed directions to one daily (Take 2 tabs (20mg ) on Monday Wednesday Friday alternating with 10 mg 1 tab on Tuesday, Thursday, Saturday, Sunday)  0  . traZODone (DESYREL) 100 MG tablet Take 100 mg by mouth at bedtime.    . Vitamin D, Ergocalciferol, (DRISDOL) 50000 UNITS CAPS Take 50,000 Units by mouth every Tuesday.      . warfarin (COUMADIN) 5 MG tablet Take 5 mg by mouth every evening. [facility]    . metoprolol succinate (TOPROL-XL) 50 MG 24 hr tablet Take 1 tablet (50 mg total) by mouth daily. Take with or immediately following a meal. 90 tablet 0   No current facility-administered medications for this visit.    Allergies:  Bactrim [sulfamethoxazole-trimethoprim]; Codeine; and Novocain [procaine hcl]   Social History: The patient  reports that she has never smoked. She has never used smokeless tobacco. She reports that she does not drink alcohol or use drugs.   ROS:  Please see the history of present illness. Otherwise, complete review of systems is positive for fatigue.  All other systems are reviewed and negative.   Physical Exam: VS:  BP 104/60   Pulse (!) 46   Ht 5\' 2"  (1.575 m)   Wt 167 lb 9.6 oz (76 kg)   SpO2 94%   BMI 30.65 kg/m , BMI Body mass index is 30.65 kg/m.  Wt Readings from Last 3 Encounters:  12/30/16 167 lb 9.6 oz (76 kg)  11/08/16 159 lb 3.2 oz (72.2 kg)  10/25/16 166 lb 10.7 oz (75.6 kg)    General: Elderly woman, using a walker. HEENT: Conjunctiva and lids normal, oropharynx clear. Neck: Supple, no elevated JVP or carotid bruits, no thyromegaly. Lungs: Decreased breath sounds at the bases, nonlabored breathing at rest. Cardiac: Regular  rate and rhythm, no S3. Abdomen: Soft, nontender, bowel sounds present, no guarding or rebound. Extremities: Mild leg edema, distal pulses 2+. Skin: Warm and dry. Musculoskeletal: No kyphosis. Neuropsychiatric: Alert and oriented x3, affect grossly appropriate.  ECG: I personally reviewed the tracing from 10/19/2016 which showed sinus rhythm with prolonged PR interval, decreased R wave progression, nonspecific ST changes.  Recent Labwork: 10/19/2016: ALT 90; AST 46; B Natriuretic Peptide 1,395.0; TSH 2.014 10/22/2016: Hemoglobin 12.5; Magnesium 1.7; Platelets 289 10/25/2016: BUN 30; Creatinine, Ser 1.08; Potassium 3.0;  Sodium 137     Component Value Date/Time   CHOL 128 10/22/2016 0548   TRIG 85 10/22/2016 0548   HDL 39 (L) 10/22/2016 0548   CHOLHDL 3.3 10/22/2016 0548   VLDL 17 10/22/2016 0548   LDLCALC 72 10/22/2016 0548    Other Studies Reviewed Today:  Echocardiogram 10/20/2016: Study Conclusions  - Left ventricle: The cavity size was normal. Wall thickness was   normal. Systolic function was normal. The estimated ejection   fraction was in the range of 55% to 60%. Wall motion was normal;   there were no regional wall motion abnormalities. Doppler   parameters are consistent with restrictive physiology, indicative   of decreased left ventricular diastolic compliance and/or   increased left atrial pressure. - Aortic valve: Moderately calcified annulus. Trileaflet;   moderately calcified leaflets. There was moderate regurgitation.   Mean gradient (S): 7 mm Hg. Peak gradient (S): 14 mm Hg. VTI   ratio of LVOT to aortic valve: 0.52. Moderately sclerotic to   mildly stenotic aortic valve. Valve area (Vmax): 1.66 cm^2. Valve   area (Vmean): 1.68 cm^2. - Mitral valve: Moderately calcified annulus. There was mild   regurgitation. - Left atrium: The atrium was mildly dilated. - Right ventricle: The cavity size was mildly dilated. Systolic   function was mildly reduced. - Right atrium: The atrium was mildly dilated. Central venous   pressure (est): 8 mm Hg. - Atrial septum: No defect or patent foramen ovale was identified. - Tricuspid valve: There was mild regurgitation. - Pulmonary arteries: Systolic pressure was severely increased. PA   peak pressure: 66 mm Hg (S). - Pericardium, extracardiac: There was a right pleural effusion.  Impressions:  - Normal LV wall thickness with LVEF 55-60% and restrictive   diastolic filling pattern. Mild left atrial enlargement.   Moderately calcified mitral annulus with mild mitral   regurgitation. Moderately sclerotic to mildly stenotic aortic   valve  with moderate aortic regurgitation. Mild dilated right   ventricle with mildly reduced contraction. Mild right atrial   enlargement. Mild tricuspid regurgitation with evidence of severe   pulmonary hypertension, estimated PASP 66 mmHg. Right pleural   effusion is noted.  Assessment and Plan:  1.  Chronic diastolic heart failure.  Continue with current standing dose of Lasix.  I reinforced use of extra doses for 2-3 pound weight gain in 24 hours or 5 pounds in a week.  Recent lab work was reassuring.  2.  Bradycardia, reduce Toprol-XL to 50 mg daily.  Hopefully this will help her feel less fatigued.  3.  Paroxysmal atrial fibrillation, maintaining sinus rhythm on flecainide.  Continue Coumadin for stroke prophylaxis.  4.  History of TIA, no recent symptoms.  Current medicines were reviewed with the patient today.  Disposition: Follow-up in 3 months.  Signed, Jonelle Sidle, MD, [facility] 12/30/2016 4:10 PM    [facility] at [facility] [address] [place], [place], [place] [address] Phone: [phone];  Fax: [phone]

## 2017-01-04 DIAGNOSIS — I480 Paroxysmal atrial fibrillation: Secondary | ICD-10-CM | POA: Diagnosis not present

## 2017-01-04 DIAGNOSIS — J449 Chronic obstructive pulmonary disease, unspecified: Secondary | ICD-10-CM | POA: Diagnosis not present

## 2017-01-04 DIAGNOSIS — Z9181 History of falling: Secondary | ICD-10-CM | POA: Diagnosis not present

## 2017-01-04 DIAGNOSIS — I110 Hypertensive heart disease with heart failure: Secondary | ICD-10-CM | POA: Diagnosis not present

## 2017-01-04 DIAGNOSIS — I5032 Chronic diastolic (congestive) heart failure: Secondary | ICD-10-CM | POA: Diagnosis not present

## 2017-01-04 DIAGNOSIS — M545 Low back pain: Secondary | ICD-10-CM | POA: Diagnosis not present

## 2017-01-07 DIAGNOSIS — I5032 Chronic diastolic (congestive) heart failure: Secondary | ICD-10-CM | POA: Diagnosis not present

## 2017-01-07 DIAGNOSIS — M545 Low back pain: Secondary | ICD-10-CM | POA: Diagnosis not present

## 2017-01-07 DIAGNOSIS — J449 Chronic obstructive pulmonary disease, unspecified: Secondary | ICD-10-CM | POA: Diagnosis not present

## 2017-01-07 DIAGNOSIS — I480 Paroxysmal atrial fibrillation: Secondary | ICD-10-CM | POA: Diagnosis not present

## 2017-01-07 DIAGNOSIS — I110 Hypertensive heart disease with heart failure: Secondary | ICD-10-CM | POA: Diagnosis not present

## 2017-01-07 DIAGNOSIS — Z9181 History of falling: Secondary | ICD-10-CM | POA: Diagnosis not present

## 2017-01-11 DIAGNOSIS — Z9181 History of falling: Secondary | ICD-10-CM | POA: Diagnosis not present

## 2017-01-11 DIAGNOSIS — I5032 Chronic diastolic (congestive) heart failure: Secondary | ICD-10-CM | POA: Diagnosis not present

## 2017-01-11 DIAGNOSIS — I110 Hypertensive heart disease with heart failure: Secondary | ICD-10-CM | POA: Diagnosis not present

## 2017-01-11 DIAGNOSIS — I480 Paroxysmal atrial fibrillation: Secondary | ICD-10-CM | POA: Diagnosis not present

## 2017-01-11 DIAGNOSIS — J449 Chronic obstructive pulmonary disease, unspecified: Secondary | ICD-10-CM | POA: Diagnosis not present

## 2017-01-11 DIAGNOSIS — M545 Low back pain: Secondary | ICD-10-CM | POA: Diagnosis not present

## 2017-01-12 DIAGNOSIS — I10 Essential (primary) hypertension: Secondary | ICD-10-CM | POA: Diagnosis not present

## 2017-01-12 DIAGNOSIS — I4891 Unspecified atrial fibrillation: Secondary | ICD-10-CM | POA: Diagnosis not present

## 2017-01-12 DIAGNOSIS — F0390 Unspecified dementia without behavioral disturbance: Secondary | ICD-10-CM | POA: Diagnosis not present

## 2017-01-12 DIAGNOSIS — F419 Anxiety disorder, unspecified: Secondary | ICD-10-CM | POA: Diagnosis not present

## 2017-01-12 DIAGNOSIS — Z299 Encounter for prophylactic measures, unspecified: Secondary | ICD-10-CM | POA: Diagnosis not present

## 2017-01-12 DIAGNOSIS — Z6829 Body mass index (BMI) 29.0-29.9, adult: Secondary | ICD-10-CM | POA: Diagnosis not present

## 2017-01-14 DIAGNOSIS — I5032 Chronic diastolic (congestive) heart failure: Secondary | ICD-10-CM | POA: Diagnosis not present

## 2017-01-14 DIAGNOSIS — I480 Paroxysmal atrial fibrillation: Secondary | ICD-10-CM | POA: Diagnosis not present

## 2017-01-14 DIAGNOSIS — I110 Hypertensive heart disease with heart failure: Secondary | ICD-10-CM | POA: Diagnosis not present

## 2017-01-14 DIAGNOSIS — M545 Low back pain: Secondary | ICD-10-CM | POA: Diagnosis not present

## 2017-01-14 DIAGNOSIS — J449 Chronic obstructive pulmonary disease, unspecified: Secondary | ICD-10-CM | POA: Diagnosis not present

## 2017-01-14 DIAGNOSIS — Z9181 History of falling: Secondary | ICD-10-CM | POA: Diagnosis not present

## 2017-01-18 DIAGNOSIS — Z9181 History of falling: Secondary | ICD-10-CM | POA: Diagnosis not present

## 2017-01-18 DIAGNOSIS — I5032 Chronic diastolic (congestive) heart failure: Secondary | ICD-10-CM | POA: Diagnosis not present

## 2017-01-18 DIAGNOSIS — I110 Hypertensive heart disease with heart failure: Secondary | ICD-10-CM | POA: Diagnosis not present

## 2017-01-18 DIAGNOSIS — M545 Low back pain: Secondary | ICD-10-CM | POA: Diagnosis not present

## 2017-01-18 DIAGNOSIS — J449 Chronic obstructive pulmonary disease, unspecified: Secondary | ICD-10-CM | POA: Diagnosis not present

## 2017-01-18 DIAGNOSIS — I480 Paroxysmal atrial fibrillation: Secondary | ICD-10-CM | POA: Diagnosis not present

## 2017-01-26 DIAGNOSIS — Z789 Other specified health status: Secondary | ICD-10-CM | POA: Diagnosis not present

## 2017-01-26 DIAGNOSIS — Z79899 Other long term (current) drug therapy: Secondary | ICD-10-CM | POA: Diagnosis not present

## 2017-01-26 DIAGNOSIS — R5383 Other fatigue: Secondary | ICD-10-CM | POA: Diagnosis not present

## 2017-01-26 DIAGNOSIS — Z1211 Encounter for screening for malignant neoplasm of colon: Secondary | ICD-10-CM | POA: Diagnosis not present

## 2017-01-26 DIAGNOSIS — Z299 Encounter for prophylactic measures, unspecified: Secondary | ICD-10-CM | POA: Diagnosis not present

## 2017-01-26 DIAGNOSIS — Z1339 Encounter for screening examination for other mental health and behavioral disorders: Secondary | ICD-10-CM | POA: Diagnosis not present

## 2017-01-26 DIAGNOSIS — Z7189 Other specified counseling: Secondary | ICD-10-CM | POA: Diagnosis not present

## 2017-01-26 DIAGNOSIS — Z6829 Body mass index (BMI) 29.0-29.9, adult: Secondary | ICD-10-CM | POA: Diagnosis not present

## 2017-01-26 DIAGNOSIS — Z0000 Encounter for general adult medical examination without abnormal findings: Secondary | ICD-10-CM | POA: Diagnosis not present

## 2017-01-26 DIAGNOSIS — I10 Essential (primary) hypertension: Secondary | ICD-10-CM | POA: Diagnosis not present

## 2017-01-26 DIAGNOSIS — I4891 Unspecified atrial fibrillation: Secondary | ICD-10-CM | POA: Diagnosis not present

## 2017-01-26 DIAGNOSIS — Z1331 Encounter for screening for depression: Secondary | ICD-10-CM | POA: Diagnosis not present

## 2017-01-26 DIAGNOSIS — E7800 Pure hypercholesterolemia, unspecified: Secondary | ICD-10-CM | POA: Diagnosis not present

## 2017-02-15 ENCOUNTER — Other Ambulatory Visit: Payer: Self-pay | Admitting: *Deleted

## 2017-02-15 MED ORDER — FLECAINIDE ACETATE 100 MG PO TABS: 100.0000 mg | ORAL_TABLET | Freq: Two times a day (BID) | ORAL | 1 refills | Status: DC

## 2017-02-23 DIAGNOSIS — Z713 Dietary counseling and surveillance: Secondary | ICD-10-CM | POA: Diagnosis not present

## 2017-02-23 DIAGNOSIS — Z299 Encounter for prophylactic measures, unspecified: Secondary | ICD-10-CM | POA: Diagnosis not present

## 2017-02-23 DIAGNOSIS — Z6829 Body mass index (BMI) 29.0-29.9, adult: Secondary | ICD-10-CM | POA: Diagnosis not present

## 2017-02-23 DIAGNOSIS — I4891 Unspecified atrial fibrillation: Secondary | ICD-10-CM | POA: Diagnosis not present

## 2017-02-25 DIAGNOSIS — E7800 Pure hypercholesterolemia, unspecified: Secondary | ICD-10-CM | POA: Diagnosis not present

## 2017-02-25 DIAGNOSIS — I4891 Unspecified atrial fibrillation: Secondary | ICD-10-CM | POA: Diagnosis not present

## 2017-02-25 DIAGNOSIS — I10 Essential (primary) hypertension: Secondary | ICD-10-CM | POA: Diagnosis not present

## 2017-02-25 DIAGNOSIS — I509 Heart failure, unspecified: Secondary | ICD-10-CM | POA: Diagnosis not present

## 2017-03-18 DIAGNOSIS — I509 Heart failure, unspecified: Secondary | ICD-10-CM | POA: Diagnosis not present

## 2017-03-18 DIAGNOSIS — E7800 Pure hypercholesterolemia, unspecified: Secondary | ICD-10-CM | POA: Diagnosis not present

## 2017-03-18 DIAGNOSIS — I4891 Unspecified atrial fibrillation: Secondary | ICD-10-CM | POA: Diagnosis not present

## 2017-03-18 DIAGNOSIS — I10 Essential (primary) hypertension: Secondary | ICD-10-CM | POA: Diagnosis not present

## 2017-03-29 DIAGNOSIS — I5030 Unspecified diastolic (congestive) heart failure: Secondary | ICD-10-CM | POA: Diagnosis not present

## 2017-03-29 DIAGNOSIS — Z299 Encounter for prophylactic measures, unspecified: Secondary | ICD-10-CM | POA: Diagnosis not present

## 2017-03-29 DIAGNOSIS — Z713 Dietary counseling and surveillance: Secondary | ICD-10-CM | POA: Diagnosis not present

## 2017-03-29 DIAGNOSIS — I10 Essential (primary) hypertension: Secondary | ICD-10-CM | POA: Diagnosis not present

## 2017-03-29 DIAGNOSIS — I4891 Unspecified atrial fibrillation: Secondary | ICD-10-CM | POA: Diagnosis not present

## 2017-04-01 NOTE — Progress Notes (Signed)
Cardiology Office Note  Date: 04/05/2017   ID: Danielle Brooks, DOB [birth year], MRN [id number]  PCP: Ignatius Specking, MD  Primary Cardiologist: Nona Dell, MD   Chief Complaint  Patient presents with  . Diastolic heart failure    History of Present Illness: Danielle Brooks is an 84 y.o. female last seen in November 2018.  She is here with her husband today for a follow-up visit.  Her weight is down about 10 pounds from last assessment.  She is now on Lasix at 20 mg daily.  Husband remains attentive and follows daily weights.  She has had a general feeling of agitation and shortness of breath, no palpitations or chest pain however.  We went over her medications.  She reports compliance and no obvious intolerances.  Heart rate remains regular today.  She has not had follow-up lab work recently which we discussed.  She continues on Coumadin with follow-up per Dr. Sherril Croon.   Past Medical History:  Diagnosis Date  . Atrial fibrillation (HCC)    Paroxysmal, history of normal LV and negative ischemic workup  . Mixed hyperlipidemia   . Osteoporosis   . TIA (transient ischemic attack)   . Vitamin D deficiency     History reviewed. No pertinent surgical history.  Current Outpatient Medications  Medication Sig Dispense Refill  . flecainide (TAMBOCOR) 100 MG tablet Take 1 tablet (100 mg total) by mouth 2 (two) times daily. 180 tablet 1  . furosemide (LASIX) 20 MG tablet TAKE 2 TABLETS (40MG ) ON MONDAY, WEDNESDAY AND FRIDAY , OTHER DAYS TAKE ONE 20MG  TABLET 180 tablet 0  . metoprolol succinate (TOPROL-XL) 50 MG 24 hr tablet Take 1 tablet (50 mg total) by mouth daily. Take with or immediately following a meal. 90 tablet 0  . potassium chloride (K-DUR) 10 MEQ tablet Take 10-20 mEq by mouth daily. With lasix:Patient changed directions to one daily (Take 2 tabs (20mg ) on Monday Wednesday Friday alternating with 10 mg 1 tab on Tuesday, Thursday, Saturday, Sunday)  0  . traZODone (DESYREL) 100 MG  tablet Take 100 mg by mouth at bedtime.    . Vitamin D, Ergocalciferol, (DRISDOL) 50000 UNITS CAPS Take 50,000 Units by mouth every Tuesday.     . warfarin (COUMADIN) 5 MG tablet Take 5 mg by mouth every evening. Managed by [facility] office     No current facility-administered medications for this visit.    Allergies:  Bactrim [sulfamethoxazole-trimethoprim]; Codeine; and Novocain [procaine hcl]   Social History: The patient  reports that  has never smoked. she has never used smokeless tobacco. She reports that she does not drink alcohol or use drugs.   ROS:  Please see the history of present illness. Otherwise, complete review of systems is positive for anxiety.  All other systems are reviewed and negative.    Physical Exam: VS:  BP 140/78   Pulse (!) 57   Ht 5\' 2"  (1.575 m)   Wt 157 lb (71.2 kg)   SpO2 93%   BMI 28.72 kg/m , BMI Body mass index is 28.72 kg/m.  Wt Readings from Last 3 Encounters:  04/05/17 157 lb (71.2 kg)  12/30/16 167 lb 9.6 oz (76 kg)  11/08/16 159 lb 3.2 oz (72.2 kg)    General: Elderly woman, in wheelchair. HEENT: Conjunctiva and lids normal, oropharynx clear. Neck: Supple, no elevated JVP or carotid bruits, no thyromegaly. Lungs: Clear to auscultation, nonlabored breathing at rest. Cardiac: Regular rate and rhythm, no S3 or significant  systolic murmur. Abdomen: Soft, nontender, bowel sounds present. Extremities: Mild ankle edema, distal pulses 2+. Skin: Warm and dry. Musculoskeletal: No kyphosis. Neuropsychiatric: Alert and oriented x3, affect grossly appropriate.  ECG: I personally reviewed the tracing from 10/19/2016 which showed sinus rhythm with prolonged PR interval and IVCD.  Recent Labwork: 10/19/2016: ALT 90; AST 46; B Natriuretic Peptide 1,395.0; TSH 2.014 10/22/2016: Hemoglobin 12.5; Magnesium 1.7; Platelets 289 10/25/2016: BUN 30; Creatinine, Ser 1.08; Potassium 3.0; Sodium 137     Component Value Date/Time   CHOL 128 10/22/2016 0548   TRIG 85  10/22/2016 0548   HDL 39 (L) 10/22/2016 0548   CHOLHDL 3.3 10/22/2016 0548   VLDL 17 10/22/2016 0548   LDLCALC 72 10/22/2016 0548    Other Studies Reviewed Today:  Echocardiogram 10/20/2016: Study Conclusions  - Left ventricle: The cavity size was normal. Wall thickness was normal. Systolic function was normal. The estimated ejection fraction was in the range of 55% to 60%. Wall motion was normal; there were no regional wall motion abnormalities. Doppler parameters are consistent with restrictive physiology, indicative of decreased left ventricular diastolic compliance and/or increased left atrial pressure. - Aortic valve: Moderately calcified annulus. Trileaflet; moderately calcified leaflets. There was moderate regurgitation. Mean gradient (S): 7 mm Hg. Peak gradient (S): 14 mm Hg. VTI ratio of LVOT to aortic valve: 0.52. Moderately sclerotic to mildly stenotic aortic valve. Valve area (Vmax): 1.66 cm^2. Valve area (Vmean): 1.68 cm^2. - Mitral valve: Moderately calcified annulus. There was mild regurgitation. - Left atrium: The atrium was mildly dilated. - Right ventricle: The cavity size was mildly dilated. Systolic function was mildly reduced. - Right atrium: The atrium was mildly dilated. Central venous pressure (est): 8 mm Hg. - Atrial septum: No defect or patent foramen ovale was identified. - Tricuspid valve: There was mild regurgitation. - Pulmonary arteries: Systolic pressure was severely increased. PA peak pressure: 66 mm Hg (S). - Pericardium, extracardiac: There was a right pleural effusion.  Impressions:  - Normal LV wall thickness with LVEF 55-60% and restrictive diastolic filling pattern. Mild left atrial enlargement. Moderately calcified mitral annulus with mild mitral regurgitation. Moderately sclerotic to mildly stenotic aortic valve with moderate aortic regurgitation. Mild dilated right ventricle with mildly  reduced contraction. Mild right atrial enlargement. Mild tricuspid regurgitation with evidence of severe pulmonary hypertension, estimated PASP 66 mmHg. Right pleural effusion is noted.  Assessment and Plan:  1.  Chronic diastolic heart failure.  Weight is down 10 pounds.  She is on Lasix at 20 mg daily at this point.   2.  Paroxysmal atrial fibrillation.  Heart rate is regular today.  She continues on flecainide, Toprol-XL, and Coumadin.  Follow-up CBC, BMET, and magnesium.  3.  History of TIA.  Currently stable on Coumadin.  4.  Hyperlipidemia, diet managed.  Current medicines were reviewed with the patient today.   Orders Placed This Encounter  Procedures  . CBC  . Basic metabolic panel  . Magnesium    Disposition: Follow-up in 4 months.  Signed, Jonelle SidleSamuel G. McDowell, MD, [facility] LLCFACC 04/05/2017 4:09 PM    [facility] [address], [place], [place] [address] Phone: [phone]) [phone]; Fax: [phone]) [phone]

## 2017-04-05 ENCOUNTER — Encounter: Payer: Self-pay | Admitting: Cardiology

## 2017-04-05 ENCOUNTER — Ambulatory Visit (INDEPENDENT_AMBULATORY_CARE_PROVIDER_SITE_OTHER): Payer: Medicare Other | Admitting: Cardiology

## 2017-04-05 VITALS — BP 140/78 | HR 57 | Ht 62.0 in | Wt 157.0 lb

## 2017-04-05 DIAGNOSIS — I480 Paroxysmal atrial fibrillation: Secondary | ICD-10-CM

## 2017-04-05 DIAGNOSIS — Z8673 Personal history of transient ischemic attack (TIA), and cerebral infarction without residual deficits: Secondary | ICD-10-CM

## 2017-04-05 DIAGNOSIS — I5032 Chronic diastolic (congestive) heart failure: Secondary | ICD-10-CM

## 2017-04-05 DIAGNOSIS — E782 Mixed hyperlipidemia: Secondary | ICD-10-CM | POA: Diagnosis not present

## 2017-04-05 NOTE — Patient Instructions (Signed)
Medication Instructions:  Your physician recommends that you continue on your current medications as directed. Please refer to the Current Medication list given to you today.  Labwork: CBC, BMET, MAG Orders given today  Testing/Procedures: NONE  Follow-Up: Your physician wants you to follow-up in: 4 MONTHS WITH DR. MCDOWELL. You will receive a reminder letter in the mail two months in advance. If you don't receive a letter, please call our office to schedule the follow-up appointment.  Any Other Special Instructions Will Be Listed Below (If Applicable).  If you need a refill on your cardiac medications before your next appointment, please call your pharmacy.

## 2017-04-18 ENCOUNTER — Other Ambulatory Visit: Payer: Self-pay | Admitting: Cardiology

## 2017-04-18 DIAGNOSIS — S098XXA Other specified injuries of head, initial encounter: Secondary | ICD-10-CM | POA: Diagnosis not present

## 2017-04-18 DIAGNOSIS — R9431 Abnormal electrocardiogram [ECG] [EKG]: Secondary | ICD-10-CM | POA: Diagnosis not present

## 2017-04-18 DIAGNOSIS — R42 Dizziness and giddiness: Secondary | ICD-10-CM | POA: Diagnosis not present

## 2017-04-18 DIAGNOSIS — T50905A Adverse effect of unspecified drugs, medicaments and biological substances, initial encounter: Secondary | ICD-10-CM | POA: Diagnosis not present

## 2017-04-18 DIAGNOSIS — I4891 Unspecified atrial fibrillation: Secondary | ICD-10-CM | POA: Diagnosis not present

## 2017-04-18 DIAGNOSIS — S0990XA Unspecified injury of head, initial encounter: Secondary | ICD-10-CM | POA: Diagnosis not present

## 2017-04-18 DIAGNOSIS — J90 Pleural effusion, not elsewhere classified: Secondary | ICD-10-CM | POA: Diagnosis not present

## 2017-04-18 DIAGNOSIS — Z7901 Long term (current) use of anticoagulants: Secondary | ICD-10-CM | POA: Diagnosis not present

## 2017-04-18 DIAGNOSIS — I509 Heart failure, unspecified: Secondary | ICD-10-CM | POA: Diagnosis not present

## 2017-04-18 DIAGNOSIS — Z79899 Other long term (current) drug therapy: Secondary | ICD-10-CM | POA: Diagnosis not present

## 2017-04-18 DIAGNOSIS — S0101XA Laceration without foreign body of scalp, initial encounter: Secondary | ICD-10-CM | POA: Diagnosis not present

## 2017-04-18 DIAGNOSIS — S0190XA Unspecified open wound of unspecified part of head, initial encounter: Secondary | ICD-10-CM | POA: Diagnosis not present

## 2017-04-19 ENCOUNTER — Inpatient Hospital Stay (HOSPITAL_COMMUNITY)
Admission: EM | Admit: 2017-04-19 | Discharge: 2017-04-21 | DRG: 308 | Disposition: A | Discharge status: Home / Self Care | Payer: Medicare Other | Source: Other Acute Inpatient Hospital | Attending: Internal Medicine | Admitting: Internal Medicine

## 2017-04-19 ENCOUNTER — Other Ambulatory Visit: Payer: Self-pay | Admitting: Cardiology

## 2017-04-19 ENCOUNTER — Inpatient Hospital Stay (HOSPITAL_COMMUNITY): Payer: Medicare Other

## 2017-04-19 ENCOUNTER — Other Ambulatory Visit: Payer: Self-pay

## 2017-04-19 ENCOUNTER — Encounter (HOSPITAL_COMMUNITY): Payer: Self-pay

## 2017-04-19 DIAGNOSIS — E559 Vitamin D deficiency, unspecified: Secondary | ICD-10-CM | POA: Diagnosis present

## 2017-04-19 DIAGNOSIS — I4892 Unspecified atrial flutter: Secondary | ICD-10-CM | POA: Diagnosis present

## 2017-04-19 DIAGNOSIS — T462X5A Adverse effect of other antidysrhythmic drugs, initial encounter: Secondary | ICD-10-CM | POA: Diagnosis present

## 2017-04-19 DIAGNOSIS — R9431 Abnormal electrocardiogram [ECG] [EKG]: Secondary | ICD-10-CM | POA: Diagnosis not present

## 2017-04-19 DIAGNOSIS — M810 Age-related osteoporosis without current pathological fracture: Secondary | ICD-10-CM | POA: Diagnosis present

## 2017-04-19 DIAGNOSIS — I472 Ventricular tachycardia, unspecified: Secondary | ICD-10-CM

## 2017-04-19 DIAGNOSIS — I447 Left bundle-branch block, unspecified: Secondary | ICD-10-CM | POA: Diagnosis present

## 2017-04-19 DIAGNOSIS — I352 Nonrheumatic aortic (valve) stenosis with insufficiency: Secondary | ICD-10-CM | POA: Diagnosis present

## 2017-04-19 DIAGNOSIS — W2209XA Striking against other stationary object, initial encounter: Secondary | ICD-10-CM | POA: Diagnosis present

## 2017-04-19 DIAGNOSIS — E782 Mixed hyperlipidemia: Secondary | ICD-10-CM | POA: Diagnosis present

## 2017-04-19 DIAGNOSIS — Z8249 Family history of ischemic heart disease and other diseases of the circulatory system: Secondary | ICD-10-CM | POA: Diagnosis not present

## 2017-04-19 DIAGNOSIS — Z7901 Long term (current) use of anticoagulants: Secondary | ICD-10-CM

## 2017-04-19 DIAGNOSIS — I110 Hypertensive heart disease with heart failure: Secondary | ICD-10-CM | POA: Diagnosis present

## 2017-04-19 DIAGNOSIS — Z820 Family history of epilepsy and other diseases of the nervous system: Secondary | ICD-10-CM

## 2017-04-19 DIAGNOSIS — Y92009 Unspecified place in unspecified non-institutional (private) residence as the place of occurrence of the external cause: Secondary | ICD-10-CM

## 2017-04-19 DIAGNOSIS — I4510 Unspecified right bundle-branch block: Secondary | ICD-10-CM | POA: Diagnosis present

## 2017-04-19 DIAGNOSIS — I351 Nonrheumatic aortic (valve) insufficiency: Secondary | ICD-10-CM | POA: Diagnosis not present

## 2017-04-19 DIAGNOSIS — T50905A Adverse effect of unspecified drugs, medicaments and biological substances, initial encounter: Secondary | ICD-10-CM | POA: Diagnosis not present

## 2017-04-19 DIAGNOSIS — Z885 Allergy status to narcotic agent status: Secondary | ICD-10-CM | POA: Diagnosis not present

## 2017-04-19 DIAGNOSIS — W19XXXA Unspecified fall, initial encounter: Secondary | ICD-10-CM | POA: Diagnosis present

## 2017-04-19 DIAGNOSIS — Z9181 History of falling: Secondary | ICD-10-CM

## 2017-04-19 DIAGNOSIS — Z8673 Personal history of transient ischemic attack (TIA), and cerebral infarction without residual deficits: Secondary | ICD-10-CM | POA: Diagnosis not present

## 2017-04-19 DIAGNOSIS — Z79899 Other long term (current) drug therapy: Secondary | ICD-10-CM | POA: Diagnosis not present

## 2017-04-19 DIAGNOSIS — I5031 Acute diastolic (congestive) heart failure: Secondary | ICD-10-CM | POA: Diagnosis not present

## 2017-04-19 DIAGNOSIS — S0101XA Laceration without foreign body of scalp, initial encounter: Secondary | ICD-10-CM | POA: Diagnosis present

## 2017-04-19 DIAGNOSIS — I272 Pulmonary hypertension, unspecified: Secondary | ICD-10-CM | POA: Diagnosis present

## 2017-04-19 DIAGNOSIS — I509 Heart failure, unspecified: Secondary | ICD-10-CM | POA: Diagnosis not present

## 2017-04-19 DIAGNOSIS — I5033 Acute on chronic diastolic (congestive) heart failure: Secondary | ICD-10-CM | POA: Diagnosis present

## 2017-04-19 DIAGNOSIS — I4891 Unspecified atrial fibrillation: Secondary | ICD-10-CM | POA: Diagnosis not present

## 2017-04-19 DIAGNOSIS — I480 Paroxysmal atrial fibrillation: Secondary | ICD-10-CM | POA: Diagnosis present

## 2017-04-19 DIAGNOSIS — R42 Dizziness and giddiness: Secondary | ICD-10-CM | POA: Diagnosis not present

## 2017-04-19 HISTORY — DX: Chronic diastolic (congestive) heart failure: I50.32

## 2017-04-19 LAB — PROTIME-INR
INR: 3.43
Prothrombin Time: 34.3 seconds — ABNORMAL HIGH (ref 11.4–15.2)

## 2017-04-19 LAB — CBC WITH DIFFERENTIAL/PLATELET
BASOS PCT: 0 %
Basophils Absolute: 0 10*3/uL (ref 0.0–0.1)
EOS ABS: 0.1 10*3/uL (ref 0.0–0.7)
EOS PCT: 1 %
HCT: 40.4 % (ref 36.0–46.0)
HEMOGLOBIN: 13.2 g/dL (ref 12.0–15.0)
Lymphocytes Relative: 14 %
Lymphs Abs: 1 10*3/uL (ref 0.7–4.0)
MCH: 29.2 pg (ref 26.0–34.0)
MCHC: 32.7 g/dL (ref 30.0–36.0)
MCV: 89.4 fL (ref 78.0–100.0)
MONOS PCT: 10 %
Monocytes Absolute: 0.7 10*3/uL (ref 0.1–1.0)
NEUTROS PCT: 75 %
Neutro Abs: 5.7 10*3/uL (ref 1.7–7.7)
PLATELETS: 329 10*3/uL (ref 150–400)
RBC: 4.52 MIL/uL (ref 3.87–5.11)
RDW: 13.9 % (ref 11.5–15.5)
WBC: 7.5 10*3/uL (ref 4.0–10.5)

## 2017-04-19 LAB — BASIC METABOLIC PANEL
ANION GAP: 15 (ref 5–15)
ANION GAP: 15 (ref 5–15)
ANION GAP: 13 (ref 5–15)
BUN: 15 mg/dL (ref 6–20)
BUN: 15 mg/dL (ref 6–20)
BUN: 15 mg/dL (ref 6–20)
CHLORIDE: 97 mmol/L — AB (ref 101–111)
CO2: 30 mmol/L (ref 22–32)
CO2: 29 mmol/L (ref 22–32)
CO2: 30 mmol/L (ref 22–32)
Calcium: 8.8 mg/dL — ABNORMAL LOW (ref 8.9–10.3)
Calcium: 8.6 mg/dL — ABNORMAL LOW (ref 8.9–10.3)
Calcium: 8.7 mg/dL — ABNORMAL LOW (ref 8.9–10.3)
Chloride: 97 mmol/L — ABNORMAL LOW (ref 101–111)
Chloride: 98 mmol/L — ABNORMAL LOW (ref 101–111)
Creatinine, Ser: 1.09 mg/dL — ABNORMAL HIGH (ref 0.44–1.00)
Creatinine, Ser: 1.07 mg/dL — ABNORMAL HIGH (ref 0.44–1.00)
Creatinine, Ser: 0.94 mg/dL (ref 0.44–1.00)
GFR calc Af Amer: 52 mL/min — ABNORMAL LOW (ref >60)
GFR calc Af Amer: 53 mL/min — ABNORMAL LOW (ref >60)
GFR calc Af Amer: >60 mL/min (ref >60)
GFR calc non Af Amer: 45 mL/min — ABNORMAL LOW (ref >60)
GFR calc non Af Amer: 53 mL/min — ABNORMAL LOW (ref >60)
GFR, EST NON AFRICAN AMERICAN: 46 mL/min — AB (ref >60)
GLUCOSE: 99 mg/dL (ref 65–99)
GLUCOSE: 143 mg/dL — AB (ref 65–99)
GLUCOSE: 107 mg/dL — AB (ref 65–99)
POTASSIUM: 3.4 mmol/L — AB (ref 3.5–5.1)
POTASSIUM: 3.3 mmol/L — AB (ref 3.5–5.1)
POTASSIUM: 3.4 mmol/L — AB (ref 3.5–5.1)
Sodium: 142 mmol/L (ref 135–145)
Sodium: 141 mmol/L (ref 135–145)
Sodium: 141 mmol/L (ref 135–145)

## 2017-04-19 LAB — COMPREHENSIVE METABOLIC PANEL
ALK PHOS: 58 U/L (ref 38–126)
ALT: 25 U/L (ref 14–54)
AST: 31 U/L (ref 15–41)
Albumin: 3.3 g/dL — ABNORMAL LOW (ref 3.5–5.0)
Anion gap: 14 (ref 5–15)
BUN: 18 mg/dL (ref 6–20)
CALCIUM: 8.9 mg/dL (ref 8.9–10.3)
CO2: 28 mmol/L (ref 22–32)
CREATININE: 1.38 mg/dL — AB (ref 0.44–1.00)
Chloride: 99 mmol/L — ABNORMAL LOW (ref 101–111)
GFR, EST AFRICAN AMERICAN: 39 mL/min — AB (ref >60)
GFR, EST NON AFRICAN AMERICAN: 34 mL/min — AB (ref >60)
Glucose, Bld: 112 mg/dL — ABNORMAL HIGH (ref 65–99)
Potassium: 4.2 mmol/L (ref 3.5–5.1)
Sodium: 141 mmol/L (ref 135–145)
Total Bilirubin: 1.2 mg/dL (ref 0.3–1.2)
Total Protein: 6.5 g/dL (ref 6.5–8.1)

## 2017-04-19 LAB — APTT: APTT: 43 s — AB (ref 24–36)

## 2017-04-19 LAB — BRAIN NATRIURETIC PEPTIDE: B Natriuretic Peptide: 2883.4 pg/mL — ABNORMAL HIGH (ref 0.0–100.0)

## 2017-04-19 LAB — TROPONIN I
TROPONIN I: 0.06 ng/mL — AB (ref <0.03)
TROPONIN I: 0.18 ng/mL — AB (ref <0.03)
Troponin I: 0.18 ng/mL (ref <0.03)

## 2017-04-19 LAB — MRSA PCR SCREENING: MRSA by PCR: NEGATIVE

## 2017-04-19 LAB — TSH: TSH: 2.032 u[IU]/mL (ref 0.350–4.500)

## 2017-04-19 LAB — MAGNESIUM: MAGNESIUM: 2.1 mg/dL (ref 1.7–2.4)

## 2017-04-19 MED ORDER — ACETAMINOPHEN 325 MG PO TABS
650.0000 mg | ORAL_TABLET | ORAL | Status: DC | PRN
Start: 2017-04-19 — End: 2017-04-21
  Administered 2017-04-19 – 2017-04-20 (×2): 650 mg via ORAL
  Filled 2017-04-19 (×2): qty 2

## 2017-04-19 MED ORDER — SODIUM BICARBONATE 8.4 % IV SOLN
50.0000 meq | Freq: Once | INTRAVENOUS | Status: AC
  Administered 2017-04-19: 50 meq via INTRAVENOUS
  Filled 2017-04-19: qty 50

## 2017-04-19 MED ORDER — DEXTROSE 5 % IV SOLN
INTRAVENOUS | Status: DC
  Administered 2017-04-19: 02:00:00 via INTRAVENOUS
  Filled 2017-04-19 (×2): qty 150

## 2017-04-19 MED ORDER — POTASSIUM CHLORIDE CRYS ER 20 MEQ PO TBCR
20.0000 meq | EXTENDED_RELEASE_TABLET | Freq: Once | ORAL | Status: AC
  Administered 2017-04-19: 20 meq via ORAL
  Filled 2017-04-19: qty 1

## 2017-04-19 MED ORDER — WARFARIN - PHARMACIST DOSING INPATIENT
Freq: Every day | Status: DC
  Administered 2017-04-20: 18:00:00

## 2017-04-19 MED ORDER — SODIUM CHLORIDE 0.9 % IV SOLN: 250.0000 mL | INTRAVENOUS | Status: DC | PRN

## 2017-04-19 MED ORDER — ONDANSETRON HCL 4 MG/2ML IJ SOLN: 4.0000 mg | Freq: Four times a day (QID) | INTRAMUSCULAR | Status: DC | PRN

## 2017-04-19 MED ORDER — SODIUM CHLORIDE 0.9% FLUSH
3.0000 mL | Freq: Two times a day (BID) | INTRAVENOUS | Status: DC
  Administered 2017-04-19 – 2017-04-20 (×4): 3 mL via INTRAVENOUS

## 2017-04-19 MED ORDER — LIDOCAINE 5 % EX PTCH
1.0000 | MEDICATED_PATCH | CUTANEOUS | Status: DC
  Administered 2017-04-19 – 2017-04-20 (×2): 1 via TRANSDERMAL
  Filled 2017-04-19 (×2): qty 1

## 2017-04-19 MED ORDER — SODIUM CHLORIDE 0.9% FLUSH: 3.0000 mL | INTRAVENOUS | Status: DC | PRN

## 2017-04-19 MED ORDER — FUROSEMIDE 10 MG/ML IJ SOLN
INTRAMUSCULAR | Status: AC
  Filled 2017-04-19: qty 4

## 2017-04-19 MED ORDER — FUROSEMIDE 10 MG/ML IJ SOLN: 40.0000 mg | Freq: Two times a day (BID) | INTRAMUSCULAR | Status: DC

## 2017-04-19 MED ORDER — POTASSIUM CHLORIDE CRYS ER 20 MEQ PO TBCR
20.0000 meq | EXTENDED_RELEASE_TABLET | Freq: Every day | ORAL | Status: DC
  Administered 2017-04-19: 20 meq via ORAL
  Filled 2017-04-19: qty 1

## 2017-04-19 MED ORDER — METOPROLOL SUCCINATE ER 50 MG PO TB24
50.0000 mg | ORAL_TABLET | Freq: Every day | ORAL | Status: DC
  Administered 2017-04-19 – 2017-04-21 (×3): 50 mg via ORAL
  Filled 2017-04-19 (×3): qty 1

## 2017-04-19 MED ORDER — FUROSEMIDE 10 MG/ML IJ SOLN
40.0000 mg | Freq: Two times a day (BID) | INTRAMUSCULAR | Status: DC
  Administered 2017-04-19 – 2017-04-20 (×5): 40 mg via INTRAVENOUS
  Filled 2017-04-19 (×4): qty 4

## 2017-04-19 NOTE — H&P (Addendum)
Admit date: 04/19/2017 [facility] Primary Cardiologist: Dr. Nona DellSamuel McDowell Chief complaint/reason for admission:syncope and WCT  HPI: Danielle Brooks is a 84 y.o. female who is being seen today for the evaluation of syncope at the request [facility].  This is an 84yo female with a history of PAF on Flecainide 100mg  BID (prior negative ischemic workup), hyperlipidemia, chronic diastolic CHF, HTN and remoted CVAwho was in her USOH until today when she had a presyncopal episode.  She was hospitalized in August 2018 with acute on chronic diastolic CHF and echo at that time showed normal LVF with restrictive diastolic filling pattern.  She was diuresed and dropped 24 lbs.  She was discharged home on O2 at 2L.  She was last seen by Dr. Diona BrownerMcDowell on 04/05/2017 and was doing well.    She says that over the past few weeks she has had increasing DOE, 2 pillow orthopnea, increasing LE edema and a 10lb weight gain.  She takes lasix 20mg  daily. She has been very weak for several days. Today she was walking with her walker and became very lightheaded and weak and fell back hitting her back and head on the door frame in the bathroom sustaining a scalp lac.  [facility] showed a wide complex tachycardia that was concerning for monomorphic VT vs. Atrial flutter with 2:1 block and widened QRS and likely due to flecainide toxicity.  She denied any history of ingesting more than her usual dose.  She denies any chest pain or pressure.  She was given IV Lopressor and started on bicarb gtt and is now transferred for further evaluation.  PMH:    Past Medical History:  Diagnosis Date  . Atrial fibrillation (HCC)    Paroxysmal, history of normal LV and negative ischemic workup  . Mixed hyperlipidemia   . Osteoporosis   . TIA (transient ischemic attack)   . Vitamin D deficiency     PSH:   No past surgical history on file.  ALLERGIES:   Bactrim  [sulfamethoxazole-trimethoprim]; Codeine; and Novocain [procaine hcl]  Prior to Admit Meds:   Medications Prior to Admission  Medication Sig Dispense Refill Last Dose  . flecainide (TAMBOCOR) 100 MG tablet Take 1 tablet (100 mg total) by mouth 2 (two) times daily. 180 tablet 1 Taking  . furosemide (LASIX) 20 MG tablet TAKE 2 TABLETS (40MG ) ON MONDAY, WEDNESDAY AND FRIDAY , OTHER DAYS TAKE ONE 20MG  TABLET 180 tablet 0 Taking  . metoprolol succinate (TOPROL-XL) 50 MG 24 hr tablet Take 1 tablet (50 mg total) by mouth daily. Take with or immediately following a meal. 90 tablet 0 Taking  . potassium chloride (K-DUR) 10 MEQ tablet Take 10-20 mEq by mouth daily. With lasix:Patient changed directions to one daily (Take 2 tabs (20mg ) on Monday Wednesday Friday alternating with 10 mg 1 tab on Tuesday, Thursday, Saturday, Sunday)  0 Taking  . traZODone (DESYREL) 100 MG tablet Take 100 mg by mouth at bedtime.   Taking  . Vitamin D, Ergocalciferol, (DRISDOL) 50000 UNITS CAPS Take 50,000 Units by mouth every Tuesday.    Taking  . warfarin (COUMADIN) 5 MG tablet Take 5 mg by mouth every evening. Managed by [facility]   Taking   Family HX:    Family History  Problem Relation Age of Onset  . Coronary artery disease Father   . Alzheimer's disease Mother   . Hypertension Sister    Social  HX:    Social History   Socioeconomic History  . Marital status: Married    Spouse name: JERRY  . Number of children: Not on file  . Years of education: Not on file  . Highest education level: Not on file  Social Needs  . Financial resource strain: Not on file  . Food insecurity - worry: Not on file  . Food insecurity - inability: Not on file  . Transportation needs - medical: Not on file  . Transportation needs - non-medical: Not on file  Occupational History  . Occupation: RETIRED    Comment: RN  Tobacco Use  . Smoking status: Never Smoker  . Smokeless tobacco: Never Used  Substance and Sexual Activity  .  Alcohol use: No    Alcohol/week: 0.0 oz  . Drug use: No  . Sexual activity: No  Other Topics Concern  . Not on file  Social History Narrative  . Not on file     ROS:  All ROS were addressed and are negative except what is stated in the HPI  PHYSICAL EXAM There were no vitals filed for this visit. General: Well developed, well nourished, in no acute distress Head: Eyes PERRLA, No xanthomas.   Normal cephalic and atramatic  Lungs:   Clear bilaterally to auscultation and percussion. Heart:   HRRR S1 S2 Pulses are 2+ & equal.            No carotid bruit. No JVD.  No abdominal bruits. No femoral bruits. Abdomen: Bowel sounds are positive, abdomen soft and non-tender without masses or                  Hernia's noted. Msk:  Back normal, normal gait. Normal strength and tone for age. Extremities:   No clubbing, cyanosis or edema.  DP +1 Neuro: Alert and oriented X 3. Psych:  Good affect, responds appropriately   Labs:   Lab Results  Component Value Date   WBC 9.2 10/22/2016   HGB 12.5 10/22/2016   HCT 38.9 10/22/2016   MCV 88.4 10/22/2016   PLT 289 10/22/2016   No results for input(s): NA, K, CL, CO2, BUN, CREATININE, CALCIUM, PROT, BILITOT, ALKPHOS, ALT, AST, GLUCOSE in the last 168 hours.  Invalid input(s): LABALBU Lab Results  Component Value Date   TROPONINI <0.03 10/19/2016   No results found for: PTT Lab Results  Component Value Date   INR 1.18 10/25/2016   INR 1.25 10/24/2016   INR 1.24 10/23/2016     Lab Results  Component Value Date   CHOL 128 10/22/2016   Lab Results  Component Value Date   HDL 39 (L) 10/22/2016   Lab Results  Component Value Date   LDLCALC 72 10/22/2016   Lab Results  Component Value Date   TRIG 85 10/22/2016   Lab Results  Component Value Date   CHOLHDL 3.3 10/22/2016   No results found for: LDLDIRECT    Radiology:  No results found.   Telemetry    Wide complex tachycardia  - Personally Reviewed  ECG    Wide  complex tachycardia with monophasic R wave in aVR most c/w VT but does not have concordance in precordial leads.  Difficult to ascertain AV dissociation - Personally Reviewed   ASSESSMENT/PLAN:   1.  Wide complex tachycardia - EKG with some criteria for Vt - monophasic R wave in aVR but not concordant in precordial leads.  Also could be atrial flutter with 2:1 block with aberration.  Difficult to discern whether there are underlying flutter waves or AV dissociation.  EKGs reviewed with Dr. Ladona Ridgel on call for EP. Concerning for Flecainide toxicity.  Her electrolytes are normal. Mag is 2.1, Na 137, K 4.4.  She is hemodynamically stable.   ER MD started  Bicarb gtt per pharmacy recommendations prior to transfer. - admit to CCU - telemetry monitoring - continue Bicarb gtt per pharmacy dosing - repeat BMET in am - stop flecainide - check BMET q6 hours  2.  Paroxysmal atrial fibrillation - continue BB - stop flecainide - continue warfarin for a CHADS2VASC score of 5 (age 6, female 1, TIA 2).  INR therapeutic at 3.3 - pharmacy to manage coumadin - EP to see in am  3.  PreSyncope - related to #1 - echo 09/2016 showed normal LVF EF 55-60% with G3DD, moderate AI and   4.  Mild AS and moderate AI by echo 09/2016   5.  Acute on chronic diastolic CHF - unclear trigger for exacerbation.  If underlying rhythm is actually atrial flutter with RVR and aberration then this may have led to acute exacerbation.   - she has had a 10lb weight gain in the past 2 weeks with increased DOE, 2 pillow orthopnea, LE edema.   - BNP elevated at 8922.  Alb 3.6. Creat 1.37. - CXRAY with bilateral pleural effusions, moderate on right, CM and diffuse pulmonary edema with consolidation at right lung base - will start Lasix 40mg  IV BID - follow I&O's, daily weights and renal function closely - Continue Kdur daily - Foley cath  6.  Scalp lac secondary to fall - laceration stapled  - Head CT reportedly done but no  report with patient records  The patient is critically ill with multiple organ systems failure and requires high complexity decision making for assessment and support, frequent evaluation and titration of therapies, application of advanced monitoring technologies and extensive interpretation of multiple databases. Critical Care Time devoted to patient care services described in this note independent of APP time is 60 minutes with >50% of time spent in direct patient care.     Armanda Magic, MD  04/19/2017  1:21 AM

## 2017-04-19 NOTE — Progress Notes (Signed)
Nutrition Brief Note  Patient identified on the Malnutrition Screening Tool (MST) Report  Pt reports that she has been living with her husband in her own home for > 1year. She has had a really good appetite, "too good". They eat take out a lot of times. She feels that any weight loss has been related to fluid loss. After this admission pt expects that she will go and live with her son. He has taken care of her in the past.  Pt confused some facts during conversation but knew this right away and stated that she has gotten a little confused. It is likely that pt consumes a high salt diet with good appetite and eating out mostly. Pt may benefit from diet education prior to d/c if applicable to d/c plan and GOC.   Wt Readings from Last 15 Encounters:  04/19/17 164 lb 0.4 oz (74.4 kg)  04/05/17 157 lb (71.2 kg)  12/30/16 167 lb 9.6 oz (76 kg)  11/08/16 159 lb 3.2 oz (72.2 kg)  10/25/16 166 lb 10.7 oz (75.6 kg)  10/12/16 180 lb (81.6 kg)  02/09/16 178 lb (80.7 kg)  10/17/15 179 lb 9.6 oz (81.5 kg)  08/08/15 182 lb 9.6 oz (82.8 kg)  05/08/15 186 lb 6.4 oz (84.6 kg)  04/07/15 186 lb (84.4 kg)  02/27/15 192 lb (87.1 kg)  11/29/14 185 lb 3.2 oz (84 kg)  10/30/14 190 lb (86.2 kg)  10/15/14 188 lb (85.3 kg)    Body mass index is 30 kg/m. Patient meets criteria for obesity  based on current BMI.   Current diet order is Heart Healthy , patient is consuming approximately >50% of meals at this time per pt. Labs and medications reviewed.   No nutrition interventions warranted at this time. If nutrition issues arise, please consult RD.   Kendell BaneHeather Keonta Monceaux RD, LDN, CNSC [phone] Pager [phone] After Hours Pager

## 2017-04-19 NOTE — Progress Notes (Signed)
Updated Dr. Mayford Knifeurner on results of CT Head & patients rhythm change from VTach to A.Fib.

## 2017-04-19 NOTE — Progress Notes (Signed)
Lidocaine patch requested for pt's back pain despite PO Tylenol. Cards resident updated and orders placed. Will continue to monitor pt closely.

## 2017-04-19 NOTE — Consult Note (Signed)
Cardiology Consultation:   Patient ID: Danielle Brooks; [id number]; 01-Oct-1928   Admit date: 04/19/2017 Date of Consult: 04/19/2017  Primary Care Provider: Ignatius SpeckingVyas, Dhruv B, MD Primary Cardiologist: No primary care provider on file. [place] Primary Electrophysiologist:  new   Patient Profile:   Danielle Brooks is a 84 y.o. female with a hx of PAF who is being seen today for the evaluation of wide QRS tachycardia on flecainide at the request of Dr. Mayford Knifeurner.  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 She was initially placed on 50 bid of flecainide but had her dose increased about 2 years ago to 100 bid. She had done well. She has a h/o falls which on careful questioning do not sound like syncope. She developed a fall yesterday and on presentation to the ED was noted to be in a wide QRS tachycardia with a northwest axis and [facility]. She has experienced additional CHF symptoms. Her tachycardia reverted from a RBBB to a LBBB morphology with the rates essentially identical. She has undergone infusions of sodium bicarbonate. She has reverted back to atrial fib with a narrow QRS. She has been treated with IV lasix. We are asked to make recommendations on AA drug therapy as well as the wide QRS tachycardia.   Past Medical History:  Diagnosis Date  . Atrial fibrillation (HCC)    Paroxysmal, history of normal LV and negative ischemic workup  . Chronic diastolic heart failure (HCC)    EF 55-60% w/severe pulmonary HTN noted on echo 10/20/16   . Mixed hyperlipidemia   . Osteoporosis   . TIA (transient ischemic attack)   . Vitamin D deficiency     No past surgical history on file.     Inpatient Medications: Scheduled Meds: . furosemide  40 mg Intravenous BID  . metoprolol succinate  50 mg Oral Daily  . potassium chloride  20 mEq Oral Daily  . sodium chloride flush  3 mL  Intravenous Q12H  . Warfarin - Pharmacist Dosing Inpatient   Does not apply q1800   Continuous Infusions: . sodium chloride     PRN Meds: sodium chloride, acetaminophen, ondansetron (ZOFRAN) IV, sodium chloride flush  Allergies:    Allergies  Allergen Reactions  . Bactrim [Sulfamethoxazole-Trimethoprim] Rash  . Codeine Itching  . Novocain [Procaine Hcl] Other (See Comments)    Sob and passed out years ago at dentist office    Social History:   Social History   Socioeconomic History  . Marital status: Married    Spouse name: JERRY  . Number of children: Not on file  . Years of education: Not on file  . Highest education level: Not on file  Social Needs  . Financial resource strain: Not on file  . Food insecurity - worry: Not on file  . Food insecurity - inability: Not on file  . Transportation needs - medical: Not on file  . Transportation needs - non-medical: Not on file  Occupational History  . Occupation: RETIRED    Comment: RN  Tobacco Use  . Smoking status: Never Smoker  . Smokeless tobacco: Never Used  Substance and Sexual Activity  . Alcohol use: No    Alcohol/week: 0.0 oz  . Drug use: No  . Sexual activity: No  Other Topics Concern  . Not on file  Social History Narrative  . Not on file  Family History:   none Family History  Problem Relation Age of Onset  . Coronary artery disease Father   . Alzheimer's disease Mother   . Hypertension Sister      ROS:  Please see the history of present illness.   All other ROS reviewed and negative.     Physical Exam/Data:   Vitals:   04/19/17 0700 04/19/17 0724 04/19/17 0800 04/19/17 0900  BP: (!) 145/72  (!) 147/83 (!) 156/115  Resp: (!) 23  (!) 26 (!) 22  Temp:  98.3 F (36.8 C)    TempSrc:  Oral    SpO2: (!) 86%  100% 97%  Weight:      Height:        Intake/Output Summary (Last 24 hours) at 04/19/2017 1028 Last data filed at 04/19/2017 0900 Gross per 24 hour  Intake 517.5 ml  Output 2200 ml    Net -1682.5 ml   Filed Weights   04/19/17 0318  Weight: 164 lb 0.4 oz (74.4 kg)   Body mass index is 30 kg/m.  General:  Well nourished, well developed, sob but in no acute distress HEENT: normal Lymph: no adenopathy Neck: 8 cm JVD Endocrine:  No thryomegaly Vascular: No carotid bruits; FA pulses 2+ bilaterally without bruits  Cardiac:  normal S1, S2; RRR; no murmur  Lungs:  Scattered rales bilaterally, no wheezing or rhonchi  Abd: soft, nontender, no hepatomegaly  Ext: no edema Musculoskeletal:  No deformities, BUE and BLE strength normal and equal Skin: warm and dry  Neuro:  CNs 2-12 intact, no focal abnormalities noted Psych:  Normal affect   EKG:  The EKG was personally reviewed and demonstrates:  Both RBBB and LBBB tachycardia with no change in the HR Telemetry:  Telemetry was personally reviewed and demonstrates:  Atrial fib with Abherration  Relevant CV Studies: none  Laboratory Data:  Chemistry Recent Labs  Lab 04/19/17 0213 04/19/17 0829  NA 141 142  K 4.2 3.4*  CL 99* 97*  CO2 28 30  GLUCOSE 112* 99  BUN 18 15  CREATININE 1.38* 1.09*  CALCIUM 8.9 8.8*  GFRNONAA 34* 45*  GFRAA 39* 52*  ANIONGAP 14 15    Recent Labs  Lab 04/19/17 0213  PROT 6.5  ALBUMIN 3.3*  AST 31  ALT 25  ALKPHOS 58  BILITOT 1.2   Hematology Recent Labs  Lab 04/19/17 0213  WBC 7.5  RBC 4.52  HGB 13.2  HCT 40.4  MCV 89.4  MCH 29.2  MCHC 32.7  RDW 13.9  PLT 329   Cardiac Enzymes Recent Labs  Lab 04/19/17 0213 04/19/17 0829  TROPONINI 0.06* 0.18*   No results for input(s): TROPIPOC in the last 168 hours.  BNP Recent Labs  Lab 04/19/17 0213  BNP 2,883.4*    DDimer No results for input(s): DDIMER in the last 168 hours.  Radiology/Studies:  No results found.  Assessment and Plan:   1. Wide QRS tachycardia - the mechanism is either VT and/or atrial flutter. The QRS has decreased and she is now in atrial fib. We have stopped the sodium  bicarbonate 2. PAF - she will no longer be a candidate for flecainide. I have recommended initiation of amiodarone because she is quite symptomatic in atrial fib. We plan to start the amiodarone tomorrow 3. Acute diastolic heart failure - she has diuresed nicely but still has evidence on exam of volume overload. I have recommended she continue IV lasix. 4. Falls - does not appear to be  syncope although it is hard to know for sure. Could be post termination atrial fib. We would consider insertion of an ILR.  Gregg Taylor,M.D.   For questions or updates, please contact [facility] Please consult [email or web address] for contact info under Cardiology/STEMI.   Signed, Lewayne Bunting, MD  04/19/2017 10:28 AM

## 2017-04-19 NOTE — Progress Notes (Signed)
  Echocardiogram 2D Echocardiogram was attempted but patient was in chair visiting with family @3 :00pm. Will do in AM. Pieter PartridgeBrooke S Ndea Kilroy 04/19/2017, 3:09 PM

## 2017-04-19 NOTE — Progress Notes (Signed)
ANTICOAGULATION CONSULT NOTE - Initial Consult  Pharmacy Consult for Warfarin  Indication: atrial fibrillation  Allergies  Allergen Reactions  . Bactrim [Sulfamethoxazole-Trimethoprim] Rash  . Codeine Itching  . Novocain [Procaine Hcl] Other (See Comments)    Sob and passed out years ago at dentist office   Patient Measurements: Height: 5\' 2"  (157.5 cm) Weight: 164 lb 0.4 oz (74.4 kg) IBW/kg (Calculated) : 50.1  Vital Signs: BP: 119/69 (02/19 0300)  Labs: Recent Labs    04/19/17 0213  HGB 13.2  HCT 40.4  PLT 329  APTT 43*  LABPROT 34.3*  INR 3.43    CrCl cannot be calculated (Patient's most recent lab result is older than the maximum 21 days allowed.).   Medical History: Past Medical History:  Diagnosis Date  . Atrial fibrillation (HCC)    Paroxysmal, history of normal LV and negative ischemic workup  . Chronic diastolic heart failure (HCC)    EF 55-60% w/severe pulmonary HTN noted on echo 10/20/16   . Mixed hyperlipidemia   . Osteoporosis   . TIA (transient ischemic attack)   . Vitamin D deficiency    Assessment: 84 y/[facility] F [facility] concerning for monomorphic VT vs. Atrial flutter with block/widened QRS. There is concern for flecainide toxicity that is currently being managed with bicarb drip and bicarb pushes prn. PTA warfarin is being continued for afib. INR is supra-therapeutic at 3.43. H/H good.   Goal of Therapy:  INR 2-3 Monitor platelets by anticoagulation protocol: Yes   Plan:  -No warfarin for now -Daily PT/INR -Resume warfarin as INR allows  Abran DukeLedford, Retina Bernardy 04/19/2017,3:36 AM

## 2017-04-19 NOTE — Progress Notes (Signed)
I reported troponin, BNP, & urinary output to Dr. Mayford Knifeurner. No new orders.

## 2017-04-20 ENCOUNTER — Other Ambulatory Visit (HOSPITAL_COMMUNITY): Payer: Medicare Other

## 2017-04-20 LAB — BASIC METABOLIC PANEL
ANION GAP: 10 (ref 5–15)
ANION GAP: 11 (ref 5–15)
BUN: 16 mg/dL (ref 6–20)
BUN: 16 mg/dL (ref 6–20)
CALCIUM: 8.4 mg/dL — AB (ref 8.9–10.3)
CALCIUM: 8.4 mg/dL — AB (ref 8.9–10.3)
CO2: 30 mmol/L (ref 22–32)
CO2: 31 mmol/L (ref 22–32)
Chloride: 98 mmol/L — ABNORMAL LOW (ref 101–111)
Chloride: 98 mmol/L — ABNORMAL LOW (ref 101–111)
Creatinine, Ser: 0.85 mg/dL (ref 0.44–1.00)
Creatinine, Ser: 0.86 mg/dL (ref 0.44–1.00)
GFR, EST NON AFRICAN AMERICAN: 59 mL/min — AB (ref >60)
GLUCOSE: 77 mg/dL (ref 65–99)
GLUCOSE: 76 mg/dL (ref 65–99)
POTASSIUM: 3.3 mmol/L — AB (ref 3.5–5.1)
POTASSIUM: 3.3 mmol/L — AB (ref 3.5–5.1)
SODIUM: 139 mmol/L (ref 135–145)
SODIUM: 139 mmol/L (ref 135–145)

## 2017-04-20 LAB — PROTIME-INR
INR: 2.46
PROTHROMBIN TIME: 26.5 s — AB (ref 11.4–15.2)

## 2017-04-20 MED ORDER — AMIODARONE HCL 200 MG PO TABS
200.0000 mg | ORAL_TABLET | Freq: Two times a day (BID) | ORAL | Status: DC
  Administered 2017-04-20 – 2017-04-21 (×3): 200 mg via ORAL
  Filled 2017-04-20 (×3): qty 1

## 2017-04-20 MED ORDER — POTASSIUM CHLORIDE CRYS ER 20 MEQ PO TBCR
40.0000 meq | EXTENDED_RELEASE_TABLET | Freq: Two times a day (BID) | ORAL | Status: AC
  Administered 2017-04-20 (×2): 40 meq via ORAL
  Filled 2017-04-20 (×2): qty 2

## 2017-04-20 MED ORDER — WARFARIN SODIUM 5 MG PO TABS
5.0000 mg | ORAL_TABLET | Freq: Once | ORAL | Status: AC
  Administered 2017-04-20: 5 mg via ORAL
  Filled 2017-04-20: qty 1

## 2017-04-20 NOTE — Progress Notes (Signed)
Report called to receiving RN on 3E-15.  All patients belongings gathered and sent with patient.  She remains in A-fib currently BP (!) 123/96   Pulse 98   Temp 97.8 F (36.6 C) (Oral)   Resp (!) 26   Ht 5\' 2"  (1.575 m)   Wt 72.2 kg (159 lb 1.6 oz)   SpO2 94%   BMI 29.10 kg/m  Family at bedside and aware of transfer.

## 2017-04-20 NOTE — Progress Notes (Signed)
ANTICOAGULATION CONSULT NOTE - Initial Consult  Pharmacy Consult for Warfarin  Indication: atrial fibrillation  Allergies  Allergen Reactions  . Bactrim [Sulfamethoxazole-Trimethoprim] Rash  . Codeine Itching  . Novocain [Procaine Hcl] Other (See Comments)    Sob and passed out years ago at dentist office   Patient Measurements: Height: 5\' 2"  (157.5 cm) Weight: 159 lb 1.6 oz (72.2 kg) IBW/kg (Calculated) : 50.1  Vital Signs: Temp: 97.8 F (36.6 C) (02/20 0843) Temp Source: Oral (02/20 0843) BP: 137/73 (02/20 0900) Pulse Rate: 98 (02/20 0700)  Labs: Recent Labs    04/19/17 0213 04/19/17 0829 04/19/17 1506 04/19/17 1939 04/20/17 0318  HGB 13.2  --   --   --   --   HCT 40.4  --   --   --   --   PLT 329  --   --   --   --   APTT 43*  --   --   --   --   LABPROT 34.3*  --   --   --  26.5*  INR 3.43  --   --   --  2.46  CREATININE 1.38* 1.09* 1.07* 0.94 0.86  0.85  TROPONINI 0.06* 0.18* 0.18*  --   --     Estimated Creatinine Clearance: 44.2 mL/min (by C-G formula based on SCr of 0.85 mg/dL).   Medical History: Past Medical History:  Diagnosis Date  . Atrial fibrillation (HCC)    Paroxysmal, history of normal LV and negative ischemic workup  . Chronic diastolic heart failure (HCC)    EF 55-60% w/severe pulmonary HTN noted on echo 10/20/16   . Mixed hyperlipidemia   . Osteoporosis   . TIA (transient ischemic attack)   . Vitamin D deficiency    Assessment: 84 [facility] F [facility] concerning for monomorphic VT vs. Atrial flutter with block/widened QRS.   Concern for flecainide toxicity that was managed with bicarb drip. EP is considering amiodarone once flecainide clears.   INR is down to 2.4, will restart warfarin tonight. Will need to follow INR closely if amio is added and have close outpatient follow up.  PTA warfarin dose - 5mg  daily except 2.5mg  on Wednesday.  Goal of Therapy:  INR 2-3 Monitor platelets by anticoagulation  protocol: Yes   Plan:  Warfarin 5mg  tonight Daily INR for now  Sheppard CoilFrank Kassidie Hendriks PharmD., BCPS Clinical Pharmacist 04/20/2017 10:20 AM

## 2017-04-20 NOTE — Care Management Note (Signed)
Case Management Note Donn PieriniKristi Anees Vanecek RN, BSN Unit 4E-Case Manager-- 2H coverage [phone]  Patient Details  Name: Danielle Brooks MRN: [id number] Date of Birth: 30-Oct-1928  Subjective/Objective:   Pt admitted with wide QRS tachycardia, suspected to be flecainide toxicity w/AFlutter, can not r/o VT                Action/Plan: PTA pt lived at home with spouse- CM to follow for transition of care needs.   Expected Discharge Date:                  Expected Discharge Plan:     In-House Referral:     Discharge planning Services  CM Consult  Post Acute Care Choice:    Choice offered to:     DME Arranged:    DME Agency:     HH Arranged:    HH Agency:     Status of Service:  In process, will continue to follow  If discussed at Long Length of Stay Meetings, dates discussed:    Discharge Disposition:   Additional Comments:  Darrold SpanWebster, Jose Corvin Hall, RN 04/20/2017, 11:41 AM

## 2017-04-20 NOTE — Plan of Care (Signed)
Heart rate remains in A-fib, no complaints of chest pain.  Patient able to ambulate with walker, O2 sats remains stable

## 2017-04-20 NOTE — Progress Notes (Addendum)
Progress Note  Patient Name: Danielle Brooks Date of Encounter: 04/20/2017  Primary Cardiologist: No primary care provider on file.   Subjective   Feels less SOB this morning, not to baseline, walked in the hall with RN, some DOE, no CP, palpitations.  Wants foley out  Inpatient Medications    Scheduled Meds: . amiodarone  200 mg Oral BID  . furosemide  40 mg Intravenous BID  . lidocaine  1 patch Transdermal Q24H  . metoprolol succinate  50 mg Oral Daily  . potassium chloride  40 mEq Oral BID  . sodium chloride flush  3 mL Intravenous Q12H  . warfarin  5 mg Oral ONCE-1800  . Warfarin - Pharmacist Dosing Inpatient   Does not apply q1800   Continuous Infusions: . sodium chloride     PRN Meds: sodium chloride, acetaminophen, ondansetron (ZOFRAN) IV, sodium chloride flush   Vital Signs    Vitals:   04/20/17 0700 04/20/17 0800 04/20/17 0843 04/20/17 0900  BP: (!) 142/67 100/86  137/73  Pulse: 98     Resp: (!) 23 (!) 27  (!) 23  Temp:   97.8 F (36.6 C)   TempSrc:   Oral   SpO2: 99% 94%  92%  Weight:      Height:        Intake/Output Summary (Last 24 hours) at 04/20/2017 1048 Last data filed at 04/20/2017 0900 Gross per 24 hour  Intake 700 ml  Output 2375 ml  Net -1675 ml   Filed Weights   04/19/17 0318 04/20/17 0500  Weight: 164 lb 0.4 oz (74.4 kg) 159 lb 1.6 oz (72.2 kg)    Telemetry    AFib 80's - Personally Reviewed  ECG    No new EKGs - Personally Reviewed  Physical Exam   GEN: No acute distress.   Neck: No JVD Cardiac: RRR, no murmurs, rubs, or gallops.  Respiratory: diminished at the bases b/l. GI: Soft, nontender, non-distended  MS: trace-1+ edema; No deformity. Neuro:  Nonfocal  Psych: Normal affect   Labs    Chemistry Recent Labs  Lab 04/19/17 0213  04/19/17 1506 04/19/17 1939 04/20/17 0318  NA 141   < > 141 141 139  139  K 4.2   < > 3.3* 3.4* 3.3*  3.3*  CL 99*   < > 97* 98* 98*  98*  CO2 28   < > 29 30 30  31   GLUCOSE 112*    < > 143* 107* 76  77  BUN 18   < > 15 15 16  16   CREATININE 1.38*   < > 1.07* 0.94 0.86  0.85  CALCIUM 8.9   < > 8.6* 8.7* 8.4*  8.4*  PROT 6.5  --   --   --   --   ALBUMIN 3.3*  --   --   --   --   AST 31  --   --   --   --   ALT 25  --   --   --   --   ALKPHOS 58  --   --   --   --   BILITOT 1.2  --   --   --   --   GFRNONAA 34*   < > 46* 53* 59*  >60  GFRAA 39*   < > 53* >60 >60  >60  ANIONGAP 14   < > 15 13 11  10    < > = values in  this interval not displayed.     Hematology Recent Labs  Lab 04/19/17 0213  WBC 7.5  RBC 4.52  HGB 13.2  HCT 40.4  MCV 89.4  MCH 29.2  MCHC 32.7  RDW 13.9  PLT 329    Cardiac Enzymes Recent Labs  Lab 04/19/17 0213 04/19/17 0829 04/19/17 1506  TROPONINI 0.06* 0.18* 0.18*   No results for input(s): TROPIPOC in the last 168 hours.   BNP Recent Labs  Lab 04/19/17 0213  BNP 2,883.4*     DDimer No results for input(s): DDIMER in the last 168 hours.   Radiology    No results found.  Cardiac Studies   Echo here is pending  10/20/16: TTE Study Conclusions - Left ventricle: The cavity size was normal. Wall thickness was   normal. Systolic function was normal. The estimated ejection   fraction was in the range of 55% to 60%. Wall motion was normal;   there were no regional wall motion abnormalities. Doppler   parameters are consistent with restrictive physiology, indicative   of decreased left ventricular diastolic compliance and/or   increased left atrial pressure. - Aortic valve: Moderately calcified annulus. Trileaflet;   moderately calcified leaflets. There was moderate regurgitation.   Mean gradient (S): 7 mm Hg. Peak gradient (S): 14 mm Hg. VTI   ratio of LVOT to aortic valve: 0.52. Moderately sclerotic to   mildly stenotic aortic valve. Valve area (Vmax): 1.66 cm^2. Valve   area (Vmean): 1.68 cm^2. - Mitral valve: Moderately calcified annulus. There was mild   regurgitation. - Left atrium: The atrium was  mildly dilated. - Right ventricle: The cavity size was mildly dilated. Systolic   function was mildly reduced. - Right atrium: The atrium was mildly dilated. Central venous   pressure (est): 8 mm Hg. - Atrial septum: No defect or patent foramen ovale was identified. - Tricuspid valve: There was mild regurgitation. - Pulmonary arteries: Systolic pressure was severely increased. PA   peak pressure: 66 mm Hg (S). - Pericardium, extracardiac: There was a right pleural effusion. Impressions: - Normal LV wall thickness with LVEF 55-60% and restrictive   diastolic filling pattern. Mild left atrial enlargement.   Moderately calcified mitral annulus with mild mitral   regurgitation. Moderately sclerotic to mildly stenotic aortic   valve with moderate aortic regurgitation. Mild dilated right   ventricle with mildly reduced contraction. Mild right atrial   enlargement. Mild tricuspid regurgitation with evidence of severe   pulmonary hypertension, estimated PASP 66 mmHg. Right pleural   effusion is noted.  Patient Profile     84 y.o. female with a hx of PAF who is being seen today for the evaluation of wide QRS tachycardia, suspected to be flecainide toxicity w/AFlutter, can not r/o VT   Assessment & Plan    1. WCT     Not recurrent     Off Flecainide     Start amiodarone today, 200mg  BID  2. PAFib     CHA2DS2Vasc is 3, on warfarin     INR today down to 2.46, pharmacy managing  3. Acute on chronic CHF (diastolic)     diuresing well, cumulatively fluid negative -     Down 5lbs     K+ replacement ordered     Further to  Go, but SOB is imroving  4. Reports of falls lately     Patient denies syncope, ??   Dr. Ladona Ridgel to see later this morning, I anticipate out of ICU today  For questions or updates, please contact [facility] Please consult [email or web address] for contact info under Cardiology/STEMI.      Signed, Sheilah Pigeon, PA-C  04/20/2017, 10:48 AM    EP  attending  Patient seen and examined.  Agree with the findings as noted above.  She is much improved from her initial presentation and appears to be closer to euvolemic since yesterday.  She has been started on amiodarone.  Flecainide has been discontinued.  She will continue diuretic therapy.  I would anticipate discharge home tomorrow with plans for cardioversion in the next 3-4 weeks.  She will continue systemic anticoagulation.  Sharrell Ku, MD

## 2017-04-20 NOTE — Discharge Instructions (Signed)

## 2017-04-20 NOTE — Discharge Summary (Addendum)
DISCHARGE SUMMARY    Patient ID: Danielle Brooks,  MRN: [id number], DOB/AGE: 1929/02/05 84 y.o.  Admit date: 04/19/2017 Discharge date: 04/21/17  Primary Care Physician: Ignatius Specking, MD  Primary Cardiologist: [name] Electrophysiologist: new to Dr. Ladona Ridgel, this admission  Primary Discharge Diagnosis:  1. Falls, uncertain if syncope 2. WCT 3. Paroxysmal Afib     CHA2DS2Vasc is 5, on warfarin 4. Acute on chronic CHF  Secondary Discharge Diagnosis:  none  Allergies  Allergen Reactions  . Bactrim [Sulfamethoxazole-Trimethoprim] Rash  . Codeine Itching  . Novocain [Procaine Hcl] Other (See Comments)    Sob and passed out years ago at dentist office     Procedures This Admission:  None  Brief HPI: Danielle Brooks is a 84 y.o. female with a history of PAF on Flecainide 100mg  BID (prior negative ischemic workup), hyperlipidemia, chronic diastolic CHF, HTN and remoted CVA was admitted to [facility] with c/o increasing DOE/SOB, weight gain and swelling, day of admission became lightheaded and fell backwards strikingher head   Hospital Course:  The patient was initially seen at [facility] EKG noted WCT, uncertain if VT or AFlutter with 2:1 conduction w/Flecainide toxicity, started on Bicarb gtt.  She had a laceration to her head requiring staples/intervention, CT head completed and negative for acute findings, and was transferred to [facility], her flecainide stopped and reverted to AFib, narrow QRS, CVR.  EP was called to her case, Dr. Roderic Scarce noted, her tachycardia reverted from a RBBB to a LBBB morphology with the rates essentially identical. She remained hemodynamically stable, was in AFib, CVR without recurrent WCT. She was diuresed for acute diastolic CHF, fluid neg - , started on amiodarone.  She came in with INR 3.43, will decrease her home warfarin in 1/2 for now given addition of the amiodarone and have her see her PMD next week for INR and staple removal/wound  check.   The patient the day of discharge feels well, denies rest SOB, has some baseline DOE, no headache, pain at laceration site.  She has ambulated in the halls without oxygen, RN yesterday reports sats remained 90's on RA w/ambulation, she is using walker at baseline, no CP, palpitations.  She reports feeling "much, much better" and feels like she is at her baseline.  Echo has been done, will follow up result out patient.  She was observed to ambulate this AM in her room with walker did well.   She was examined by Dr. Ladona Ridgel and considered stable for discharge to home.     Physical Exam: Vitals:   04/20/17 1608 04/20/17 1924 04/21/17 0054 04/21/17 0415  BP: 135/84 (!) 141/68 (!) 141/67 (!) 157/75  Pulse: 94 (!) 102 92 99  Resp: 20 18 20 20   Temp: 98.2 F (36.8 C) 98.7 F (37.1 C) 97.9 F (36.6 C) 98.1 F (36.7 C)  TempSrc: Oral Oral Oral Oral  SpO2: 96% 95% 93% 97%  Weight: 158 lb 1.1 oz (71.7 kg)   156 lb 12.8 oz (71.1 kg)  Height: 5\' 2"  (1.575 m)       GEN- The patient is well appearing, alert and oriented x 3 today.   HEENT: normocephalic, posterior head, staples in place, no bleeding, swelling, is clean and dry ; sclera clear, conjunctiva pink; hearing intact; oropharynx clear; neck supple, no JVP Lungs- CTA b/l, normal work of breathing.  No wheezes, rales, rhonchi Heart- RRR, no murmurs, rubs or gallops, PMI not laterally displaced GI- soft, non-tender, non-distended  Extremities- no clubbing, cyanosis, or edema MS- no significant deformity, age appropriate atrophy Skin- warm and dry, no rash or lesion Psych- euthymic mood, full affect Neuro- no gross deficits   Labs:   Lab Results  Component Value Date   WBC 7.5 04/19/2017   HGB 13.2 04/19/2017   HCT 40.4 04/19/2017   MCV 89.4 04/19/2017   PLT 329 04/19/2017    Recent Labs  Lab 04/19/17 0213  04/21/17 0502  NA 141   < > 138  K 4.2   < > 3.6  CL 99*   < > 99*  CO2 28   < > 28  BUN 18   < > 15  CREATININE  1.38*   < > 0.84  CALCIUM 8.9   < > 8.6*  PROT 6.5  --   --   BILITOT 1.2  --   --   ALKPHOS 58  --   --   ALT 25  --   --   AST 31  --   --   GLUCOSE 112*   < > 86   < > = values in this interval not displayed.    Discharge Medications:  Allergies as of 04/21/2017      Reactions   Bactrim [sulfamethoxazole-trimethoprim] Rash   Codeine Itching   Novocain [procaine Hcl] Other (See Comments)   Sob and passed out years ago at dentist office      Medication List    STOP taking these medications   flecainide 100 MG tablet Commonly known as:  TAMBOCOR     TAKE these medications   amiodarone 200 MG tablet Commonly known as:  PACERONE Take 1 tablet (200 mg total) by mouth 2 (two) times daily.   donepezil 5 MG tablet Commonly known as:  ARICEPT Take 5 mg by mouth daily.   furosemide 20 MG tablet Commonly known as:  LASIX TAKE 2 TABLETS (40MG ) ON MONDAY, WEDNESDAY AND FRIDAY , OTHER DAYS TAKE ONE 20MG  TABLET   metoprolol succinate 50 MG 24 hr tablet Commonly known as:  TOPROL-XL Take 1 tablet (50 mg total) by mouth daily. Take with or immediately following a meal.   potassium chloride 10 MEQ tablet Commonly known as:  K-DUR Take 10-20 mEq by mouth See admin instructions. With lasix:Patient changed directions to one daily (Take 2 tabs (20mg ) on Monday Wednesday Friday alternating with 10 mg 1 tab on Tuesday, Thursday, Saturday, Sunday)   traZODone 100 MG tablet Commonly known as:  DESYREL Take 100 mg by mouth at bedtime.   Vitamin D (Ergocalciferol) 50000 units Caps capsule Commonly known as:  DRISDOL Take 50,000 Units by mouth every Tuesday.   warfarin 5 MG tablet Commonly known as:  COUMADIN Take 2.5-5 mg by mouth See admin instructions. Take 1 tablet (5mg ) all days except on Wednesday take 1/2 tablet (2.5mg ) Managed by [facility] office Notes to patient:  Only take 2.5mg  (1/2 tablet) daily until seen by Dr. Sherril CroonVyas appointment 04/28/17.         Disposition:   Home Discharge Instructions    Diet - low sodium heart healthy   Complete by:  As directed    Increase activity slowly   Complete by:  As directed      Follow-up Information    Doreen BeamVyas, Dhruv B, MD Follow up on 04/28/2017.   Specialty:  Internal Medicine Why:  3:15PM  Contact information: [address] [place] [address] [phone]        Lewayne Buntingaylor, Ashten Sarnowski  W, MD Follow up on 05/19/2017.   Specialty:  Cardiology Why:  11:45AM  Contact information: [address] [place] [address] [phone]           Duration of Discharge Encounter: Greater than 30 minutes including physician time.  Norma Fredrickson, PA-C 04/21/2017 10:09 AM   EP Attending  Patient seen and examined. Agree with above. She is stable for DC home. She will return for DCCV in 3-4 weeks.  Leonia Reeves.D.

## 2017-04-21 ENCOUNTER — Inpatient Hospital Stay (HOSPITAL_COMMUNITY): Payer: Medicare Other

## 2017-04-21 DIAGNOSIS — I351 Nonrheumatic aortic (valve) insufficiency: Secondary | ICD-10-CM

## 2017-04-21 LAB — BASIC METABOLIC PANEL
Anion gap: 11 (ref 5–15)
BUN: 15 mg/dL (ref 6–20)
CHLORIDE: 99 mmol/L — AB (ref 101–111)
CO2: 28 mmol/L (ref 22–32)
Calcium: 8.6 mg/dL — ABNORMAL LOW (ref 8.9–10.3)
Creatinine, Ser: 0.84 mg/dL (ref 0.44–1.00)
GFR calc Af Amer: >60 mL/min (ref >60)
GFR calc non Af Amer: >60 mL/min (ref >60)
GLUCOSE: 86 mg/dL (ref 65–99)
POTASSIUM: 3.6 mmol/L (ref 3.5–5.1)
Sodium: 138 mmol/L (ref 135–145)

## 2017-04-21 LAB — ECHOCARDIOGRAM COMPLETE
HEIGHTINCHES: 62 in
Weight: 2508.8 oz

## 2017-04-21 LAB — PROTIME-INR
INR: 1.74
Prothrombin Time: 20.2 seconds — ABNORMAL HIGH (ref 11.4–15.2)

## 2017-04-21 MED ORDER — AMIODARONE HCL 200 MG PO TABS: 200.0000 mg | ORAL_TABLET | Freq: Two times a day (BID) | ORAL | 6 refills | Status: DC

## 2017-04-21 MED ORDER — WARFARIN SODIUM 2.5 MG PO TABS
2.5000 mg | ORAL_TABLET | ORAL | Status: AC
  Administered 2017-04-21: 2.5 mg via ORAL
  Filled 2017-04-21: qty 1

## 2017-04-21 NOTE — Progress Notes (Signed)
ANTICOAGULATION CONSULT NOTE - Initial Consult  Pharmacy Consult for Warfarin  Indication: atrial fibrillation  Allergies  Allergen Reactions  . Bactrim [Sulfamethoxazole-Trimethoprim] Rash  . Codeine Itching  . Novocain [Procaine Hcl] Other (See Comments)    Sob and passed out years ago at dentist office   Patient Measurements: Height: 5\' 2"  (157.5 cm) Weight: 156 lb 12.8 oz (71.1 kg) IBW/kg (Calculated) : 50.1  Vital Signs: Temp: 98.1 F (36.7 C) (02/21 0415) Temp Source: Oral (02/21 0415) BP: 157/75 (02/21 0415) Pulse Rate: 99 (02/21 0415)  Labs: Recent Labs    04/19/17 0213 04/19/17 0829 04/19/17 1506 04/19/17 1939 04/20/17 0318 04/21/17 0502  HGB 13.2  --   --   --   --   --   HCT 40.4  --   --   --   --   --   PLT 329  --   --   --   --   --   APTT 43*  --   --   --   --   --   LABPROT 34.3*  --   --   --  26.5* 20.2*  INR 3.43  --   --   --  2.46 1.74  CREATININE 1.38* 1.09* 1.07* 0.94 0.86  0.85 0.84  TROPONINI 0.06* 0.18* 0.18*  --   --   --     Estimated Creatinine Clearance: 44.4 mL/min (by C-G formula based on SCr of 0.84 mg/dL).   Medical History: Past Medical History:  Diagnosis Date  . Atrial fibrillation (HCC)    Paroxysmal, history of normal LV and negative ischemic workup  . Chronic diastolic heart failure (HCC)    EF 55-60% w/severe pulmonary HTN noted on echo 10/20/16   . Mixed hyperlipidemia   . Osteoporosis   . TIA (transient ischemic attack)   . Vitamin D deficiency    Assessment: 84 y/o F [facility] E[facility] concerning for monomorphic VT vs. Atrial flutter with block/widened QRS.   Concern for flecainide toxicity that was managed with bicarb drip.  Amiodarone started on 2/20.  PTA warfarin dose - 5mg  daily except 2.5mg  on Wednesday with last dose 2/18.  INR is down to 1.74 after holding 2/19 for a supratherapeutic INR of 3.43 upon admission and resuming home dose.  CBC within normal limits and no bleeding  reported.   Goal of Therapy:  INR 2-3 Monitor platelets by anticoagulation protocol: Yes   Plan:  - Warfarin 2.5 mg now in order to receive prior to discharge - discussed with cardiology PA to decrease home regimen to warfarin 2.5 mg daily due to prior elevated INR upon admission and starting amiodarone - Outpatient follow-up in 1 week  Thank you for allowing us to participate in this patients care.  Verlin FesterAmber Altamese Deguire, PharmD 04/21/2017 10:00 AM

## 2017-04-21 NOTE — Progress Notes (Signed)
Pt ready for discharge home with husband and Son. Reviewed all discharge instructions, prescriptions and f/u appointments.

## 2017-04-21 NOTE — Progress Notes (Signed)
Echocardiogram 2D Echocardiogram has been performed.  Pieter PartridgeBrooke S Ellakate Gonsalves 04/21/2017, 9:23 AM

## 2017-04-21 NOTE — Plan of Care (Signed)
  Pain Managment: General experience of comfort will improve 04/21/2017 0459 - Completed/Met by Evert Kohl, RN   Nutrition: Adequate nutrition will be maintained 04/21/2017 0459 - Completed/Met by Evert Kohl, RN

## 2017-04-25 ENCOUNTER — Telehealth: Payer: Self-pay | Admitting: Internal Medicine

## 2017-04-25 ENCOUNTER — Other Ambulatory Visit: Payer: Self-pay | Admitting: Cardiology

## 2017-04-25 NOTE — Telephone Encounter (Signed)
Attempted outreach to husband.  Phone line busy.

## 2017-04-25 NOTE — Telephone Encounter (Signed)
Danielle Brooks is returning a call for his Wife . Thanks

## 2017-04-27 ENCOUNTER — Telehealth: Payer: Self-pay

## 2017-04-27 DIAGNOSIS — I351 Nonrheumatic aortic (valve) insufficiency: Secondary | ICD-10-CM

## 2017-04-27 NOTE — Telephone Encounter (Signed)
Notes recorded by Phineas Semenobertson, Alexzia Kasler, RN on 04/27/2017 at 12:56 PM EST Patient made aware of echo results. She verbalized understanding and thankful for the call. Echo ordered to be schedule in a year (04/2018)   Notes recorded by Quintella Reicherturner, Traci R, MD on 04/21/2017 at 4:51 PM EST Echo showed mildly reduced LVF with EF 45-50% with moderate AR and mild MR. Mild pulmonary HTN - repeat echo in 1 year to reassess AI

## 2017-04-28 DIAGNOSIS — Z6827 Body mass index (BMI) 27.0-27.9, adult: Secondary | ICD-10-CM | POA: Diagnosis not present

## 2017-04-28 DIAGNOSIS — I4891 Unspecified atrial fibrillation: Secondary | ICD-10-CM | POA: Diagnosis not present

## 2017-04-28 DIAGNOSIS — Z299 Encounter for prophylactic measures, unspecified: Secondary | ICD-10-CM | POA: Diagnosis not present

## 2017-04-28 DIAGNOSIS — I5030 Unspecified diastolic (congestive) heart failure: Secondary | ICD-10-CM | POA: Diagnosis not present

## 2017-04-28 DIAGNOSIS — I10 Essential (primary) hypertension: Secondary | ICD-10-CM | POA: Diagnosis not present

## 2017-05-19 ENCOUNTER — Ambulatory Visit (INDEPENDENT_AMBULATORY_CARE_PROVIDER_SITE_OTHER): Payer: Medicare Other | Admitting: Internal Medicine

## 2017-05-19 ENCOUNTER — Encounter: Payer: Self-pay | Admitting: Internal Medicine

## 2017-05-19 VITALS — BP 118/78 | HR 69 | Ht 62.0 in | Wt 159.4 lb

## 2017-05-19 DIAGNOSIS — I5032 Chronic diastolic (congestive) heart failure: Secondary | ICD-10-CM | POA: Diagnosis not present

## 2017-05-19 DIAGNOSIS — I480 Paroxysmal atrial fibrillation: Secondary | ICD-10-CM | POA: Diagnosis not present

## 2017-05-19 MED ORDER — AMIODARONE HCL 200 MG PO TABS: 200.0000 mg | ORAL_TABLET | Freq: Every day | ORAL | 3 refills | Status: DC

## 2017-05-19 NOTE — Progress Notes (Signed)
HPI Mrs. Motsinger returns today for followup of her atrial fib. She is a pleasant 84 yo woman with persistent atrial fib who had been controlled with flecainide but developed VT/Atrial flutter with a very wide QRS and was hospitalized several weeks ago. She was started on amiodarone. She is better though she still notes some fatigue. She has not experienced any atrial fib on 400 a day of amio. No edema. No sob.  Allergies  Allergen Reactions  . Bactrim [Sulfamethoxazole-Trimethoprim] Rash  . Codeine Itching  . Novocain [Procaine Hcl] Other (See Comments)    Sob and passed out years ago at dentist office     Current Outpatient Medications  Medication Sig Dispense Refill  . amiodarone (PACERONE) 200 MG tablet Take 1 tablet (200 mg total) by mouth 2 (two) times daily. 60 tablet 6  . donepezil (ARICEPT) 5 MG tablet Take 5 mg by mouth daily.    . furosemide (LASIX) 20 MG tablet TAKE 2 TABLETS BY MOUTH ON MONDAY, WEDNESDAY, AND FRIDAY OTHER DAYS TAKE 1 TABLET. 60 tablet 6  . potassium chloride (K-DUR) 10 MEQ tablet Take 10-20 mEq by mouth See admin instructions. With lasix:Patient changed directions to one daily (Take 2 tabs (20mg ) on Monday Wednesday Friday alternating with 10 mg 1 tab on Tuesday, Thursday, Saturday, Sunday)  0  . traZODone (DESYREL) 100 MG tablet Take 100 mg by mouth at bedtime.    . Vitamin D, Ergocalciferol, (DRISDOL) 50000 UNITS CAPS Take 50,000 Units by mouth every Tuesday.     . warfarin (COUMADIN) 5 MG tablet Take 2.5-5 mg by mouth See admin instructions. Take 1 tablet (5mg ) all days except on Wednesday take 1/2 tablet (2.5mg ) Managed by [facility] office    . metoprolol succinate (TOPROL-XL) 50 MG 24 hr tablet Take 1 tablet (50 mg total) by mouth daily. Take with or immediately following a meal. 90 tablet 0   No current facility-administered medications for this visit.      Past Medical History:  Diagnosis Date  . Atrial fibrillation (HCC)    Paroxysmal, history of  normal LV and negative ischemic workup  . Chronic diastolic heart failure (HCC)    EF 55-60% w/severe pulmonary HTN noted on echo 10/20/16   . Mixed hyperlipidemia   . Osteoporosis   . TIA (transient ischemic attack)   . Vitamin D deficiency     ROS:   All systems reviewed and negative except as noted in the HPI.   No past surgical history on file.   Family History  Problem Relation Age of Onset  . Coronary artery disease Father   . Alzheimer's disease Mother   . Hypertension Sister      Social History   Socioeconomic History  . Marital status: Married    Spouse name: JERRY  . Number of children: Not on file  . Years of education: Not on file  . Highest education level: Not on file  Occupational History  . Occupation: RETIRED    Comment: RN  Social Needs  . Financial resource strain: Not on file  . Food insecurity:    Worry: Not on file    Inability: Not on file  . Transportation needs:    Medical: Not on file    Non-medical: Not on file  Tobacco Use  . Smoking status: Never Smoker  . Smokeless tobacco: Never Used  Substance and Sexual Activity  . Alcohol use: No    Alcohol/week: 0.0 oz  . Drug use:  No  . Sexual activity: Never  Lifestyle  . Physical activity:    Days per week: Not on file    Minutes per session: Not on file  . Stress: Not on file  Relationships  . Social connections:    Talks on phone: Not on file    Gets together: Not on file    Attends religious service: Not on file    Active member of club or organization: Not on file    Attends meetings of clubs or organizations: Not on file    Relationship status: Not on file  . Intimate partner violence:    Fear of current or ex partner: Not on file    Emotionally abused: Not on file    Physically abused: Not on file    Forced sexual activity: Not on file  Other Topics Concern  . Not on file  Social History Narrative  . Not on file     BP 118/78   Pulse 69   Ht 5\' 2"  (1.575 m)   Wt  159 lb 6.4 oz (72.3 kg)   SpO2 95%   BMI 29.15 kg/m   Physical Exam:  Well appearing 84 yo woman, NAD HEENT: Unremarkable Neck:  6 cm JVD, no thyromegally Lymphatics:  No adenopathy Back:  No CVA tenderness Lungs:  Clear with no wheezes HEART:  Regular rate rhythm, no murmurs, no rubs, no clicks Abd:  soft, positive bowel sounds, no organomegally, no rebound, no guarding Ext:  2 plus pulses, no edema, no cyanosis, no clubbing Skin:  No rashes no nodules Neuro:  CN II through XII intact, motor grossly intact    Assess/Plan: 1. PAF - she is maintaining NSR. I have asked her to reduce her dose of amio to 200 mg daily. 2. Chronic diastolic heart failure - she still has some dyspnea but is overall better.  3. HTN - her blood pressure is well controlled today. She will continue her current meds. 4. Systemic anti-coagulation - her warfarin dose has been stable. She has not had any bleeding. Will follow.  Leonia ReevesGregg Thaison Kolodziejski,M.D.

## 2017-05-19 NOTE — Patient Instructions (Signed)
Medication Instructions:  Your physician has recommended you make the following change in your medication:  Decrease Amiodarone to 200 mg Daily    Labwork: NONE   Testing/Procedures: NONE   Follow-Up: Your physician wants you to follow-up in: 6 months. You will receive a reminder letter in the mail two months in advance. If you don't receive a letter, please call our office to schedule the follow-up appointment.   Any Other Special Instructions Will Be Listed Below (If Applicable).     If you need a refill on your cardiac medications before your next appointment, please call your pharmacy.  Thank you for choosing [facility]!

## 2017-05-27 DIAGNOSIS — I4891 Unspecified atrial fibrillation: Secondary | ICD-10-CM | POA: Diagnosis not present

## 2017-05-27 DIAGNOSIS — J069 Acute upper respiratory infection, unspecified: Secondary | ICD-10-CM | POA: Diagnosis not present

## 2017-05-27 DIAGNOSIS — I10 Essential (primary) hypertension: Secondary | ICD-10-CM | POA: Diagnosis not present

## 2017-05-27 DIAGNOSIS — Z299 Encounter for prophylactic measures, unspecified: Secondary | ICD-10-CM | POA: Diagnosis not present

## 2017-05-27 DIAGNOSIS — Z6827 Body mass index (BMI) 27.0-27.9, adult: Secondary | ICD-10-CM | POA: Diagnosis not present

## 2017-06-20 DIAGNOSIS — E7800 Pure hypercholesterolemia, unspecified: Secondary | ICD-10-CM | POA: Diagnosis not present

## 2017-06-20 DIAGNOSIS — I10 Essential (primary) hypertension: Secondary | ICD-10-CM | POA: Diagnosis not present

## 2017-06-20 DIAGNOSIS — I509 Heart failure, unspecified: Secondary | ICD-10-CM | POA: Diagnosis not present

## 2017-06-20 DIAGNOSIS — I4891 Unspecified atrial fibrillation: Secondary | ICD-10-CM | POA: Diagnosis not present

## 2017-06-24 DIAGNOSIS — Z6827 Body mass index (BMI) 27.0-27.9, adult: Secondary | ICD-10-CM | POA: Diagnosis not present

## 2017-06-24 DIAGNOSIS — Z713 Dietary counseling and surveillance: Secondary | ICD-10-CM | POA: Diagnosis not present

## 2017-06-24 DIAGNOSIS — I4891 Unspecified atrial fibrillation: Secondary | ICD-10-CM | POA: Diagnosis not present

## 2017-06-24 DIAGNOSIS — Z299 Encounter for prophylactic measures, unspecified: Secondary | ICD-10-CM | POA: Diagnosis not present

## 2017-06-24 DIAGNOSIS — I5030 Unspecified diastolic (congestive) heart failure: Secondary | ICD-10-CM | POA: Diagnosis not present

## 2017-07-06 DIAGNOSIS — I509 Heart failure, unspecified: Secondary | ICD-10-CM | POA: Diagnosis not present

## 2017-07-06 DIAGNOSIS — I10 Essential (primary) hypertension: Secondary | ICD-10-CM | POA: Diagnosis not present

## 2017-07-06 DIAGNOSIS — I4891 Unspecified atrial fibrillation: Secondary | ICD-10-CM | POA: Diagnosis not present

## 2017-07-06 DIAGNOSIS — E7800 Pure hypercholesterolemia, unspecified: Secondary | ICD-10-CM | POA: Diagnosis not present

## 2017-07-21 DIAGNOSIS — I10 Essential (primary) hypertension: Secondary | ICD-10-CM | POA: Diagnosis not present

## 2017-07-21 DIAGNOSIS — Z299 Encounter for prophylactic measures, unspecified: Secondary | ICD-10-CM | POA: Diagnosis not present

## 2017-07-21 DIAGNOSIS — I4891 Unspecified atrial fibrillation: Secondary | ICD-10-CM | POA: Diagnosis not present

## 2017-07-21 DIAGNOSIS — I5030 Unspecified diastolic (congestive) heart failure: Secondary | ICD-10-CM | POA: Diagnosis not present

## 2017-07-21 DIAGNOSIS — R0600 Dyspnea, unspecified: Secondary | ICD-10-CM | POA: Diagnosis not present

## 2017-08-10 NOTE — Progress Notes (Signed)
Cardiology Office Note  Date: 08/11/2017   ID: HAZELYNN MCKENNY, DOB 03/01/1929, MRN [id number]  PCP: Ignatius Specking, MD  Primary Cardiologist: Nona Dell, MD   Chief Complaint  Patient presents with  . PAF  . Diastolic heart failure    History of Present Illness: Danielle Brooks is an 84 y.o. female last seen in February.  Reviewed her interval records.  She was hospitalized subsequently with progressive shortness of breath and fluid overload, also episode of syncope.  She was found to have a wide-complex tachycardia concerning for either VT or atrial flutter with 2:1 block.  She was taken off flecainide and ultimately placed on amiodarone with assessment by Dr. Ladona Ridgel.  She had a follow-up EP visit in March.  She is here with her husband for follow-up visit.  Remains sedentary but has had more mental alertness, verified by her husband.  She is interacting more normally today.  She states that she has been short of breath with activity, weight has not changed.  Also noted to be bradycardic.  She states that Dr. Sherril Croon decreased her Toprol-XL dose.  She has had no dizziness or syncope.  She remains on Coumadin with follow-up per Dr. Sherril Croon.  No spontaneous bleeding problems.  Echocardiogram in February revealed LVEF 45 to 50%, moderate aortic regurgitation, mild mitral regurgitation, PASP 39 mmHg.  I personally reviewed her ECG today which shows sinus bradycardia at 51 bpm with low voltage.  Past Medical History:  Diagnosis Date  . Atrial fibrillation (HCC)    Paroxysmal, history of normal LV and negative ischemic workup  . Chronic diastolic heart failure (HCC)    EF 55-60% w/severe pulmonary HTN noted on echo 10/20/16   . Mixed hyperlipidemia   . Osteoporosis   . TIA (transient ischemic attack)   . Vitamin D deficiency     History reviewed. No pertinent surgical history.  Current Outpatient Medications  Medication Sig Dispense Refill  . amiodarone (PACERONE) 200 MG tablet Take 1  tablet (200 mg total) by mouth daily. 90 tablet 3  . donepezil (ARICEPT) 5 MG tablet Take 5 mg by mouth daily.    . furosemide (LASIX) 20 MG tablet Take 20 mg by mouth daily.    . potassium chloride (K-DUR) 10 MEQ tablet Take 10-20 mEq by mouth daily.   0  . traZODone (DESYREL) 100 MG tablet Take 100 mg by mouth at bedtime.    . Vitamin D, Ergocalciferol, (DRISDOL) 50000 UNITS CAPS Take 50,000 Units by mouth every Tuesday.     . warfarin (COUMADIN) 5 MG tablet Take 2.5-5 mg by mouth See admin instructions. Take 1 tablet (5mg ) all days except on Wednesday take 1/2 tablet (2.5mg ) Managed by [facility]ril Croon office     No current facility-administered medications for this visit.    Allergies:  Bactrim [sulfamethoxazole-trimethoprim]; Codeine; and Novocain [procaine hcl]   Social History: The patient  reports that she has never smoked. She has never used smokeless tobacco. She reports that she does not drink alcohol or use drugs.   ROS:  Please see the history of present illness. Otherwise, complete review of systems is positive for chronic shortness of breath.  All other systems are reviewed and negative.   Physical Exam: VS:  BP (!) 167/65   Pulse (!) 51   Ht 5\' 2"  (1.575 m)   Wt 154 lb 6.4 oz (70 kg)   SpO2 99%   BMI 28.24 kg/m , BMI Body mass index is 28.24 kg/m.  Wt Readings from Last 3 Encounters:  08/11/17 154 lb 6.4 oz (70 kg)  05/19/17 159 lb 6.4 oz (72.3 kg)  04/21/17 156 lb 12.8 oz (71.1 kg)    General: Elderly woman in wheelchair. HEENT: Conjunctiva and lids normal, oropharynx clear. Neck: Supple, no elevated JVP or carotid bruits, no thyromegaly. Lungs: Clear to auscultation, nonlabored breathing at rest. Cardiac: Regular rate and rhythm, no S3, soft systolic murmur. Abdomen: Soft, nontender, bowel sounds present. Extremities: Mild ankle edema, distal pulses 2+. Skin: Warm and dry. Musculoskeletal: No kyphosis. Neuropsychiatric: Alert and oriented x3, affect grossly  appropriate.  ECG: I personally reviewed the tracing from 04/19/2017 which shows a wide-complex tachycardia, possibly 2:1 atrial flutter with left bundle branch block, cannot exclude VT completely.  Recent Labwork: 04/19/2017: ALT 25; AST 31; B Natriuretic Peptide 2,883.4; Hemoglobin 13.2; Magnesium 2.1; Platelets 329; TSH 2.032 04/21/2017: BUN 15; Creatinine, Ser 0.84; Potassium 3.6; Sodium 138     Component Value Date/Time   CHOL 128 10/22/2016 0548   TRIG 85 10/22/2016 0548   HDL 39 (L) 10/22/2016 0548   CHOLHDL 3.3 10/22/2016 0548   VLDL 17 10/22/2016 0548   LDLCALC 72 10/22/2016 0548    Other Studies Reviewed Today:  Echocardiogram 04/21/2017: Study Conclusions  - Left ventricle: The cavity size was normal. Wall thickness was   increased in a pattern of mild LVH. Systolic function was mildly   reduced. The estimated ejection fraction was in the range of 45%   to 50%. Diffuse hypokinesis. Doppler parameters are consistent   with high ventricular filling pressure. - Aortic valve: There was moderate regurgitation. - Mitral valve: Calcified annulus. There was mild regurgitation. - Right ventricle: Systolic function was mildly reduced. - Pulmonary arteries: Systolic pressure was mildly increased. PA   peak pressure: 39 mm Hg (S). - Pericardium, extracardiac: A trivial pericardial effusion was   identified.  Impressions:  - Mild global reduction in LV systolic function; mild LVH; moderate   AI; mild MR; mildly reduced RV function; mild TR with mild   pulmonary hypertension.  Assessment and Plan:  1.  Paroxysmal atrial fibrillation.  Patient was taken off flecainide due to wide-complex tachycardia, now on amiodarone at 200 mg daily.  She remains on Coumadin for stroke prophylaxis.  2.  Probable symptomatic bradycardia.  She is on amiodarone, Toprol-XL, and also Aricept which could be contributing as well.  For now I will have her stop Toprol-XL completely.  Next step would  be stopping Aricept.  Keep amiodarone at low dose with office follow-up arranged.  3.  Chronic diastolic heart failure.  LVEF 45 to 50% by most recent echocardiogram.  Weight is down 5 pounds compared to March.  She remains on Lasix with potassium supplements.  4  History of TIA.  No new symptoms.  She remains anticoagulated with Coumadin.  Current medicines were reviewed with the patient today.  Disposition: Follow-up in 6 weeks.  Signed, Jonelle SidleSamuel G. McDowell, MD, [facility] 08/11/2017 3:42 PM    [facility] [address], [place], [place] [address] Phone: [phone]; Fax: [phone]

## 2017-08-11 ENCOUNTER — Encounter: Payer: Self-pay | Admitting: Cardiology

## 2017-08-11 ENCOUNTER — Ambulatory Visit (INDEPENDENT_AMBULATORY_CARE_PROVIDER_SITE_OTHER): Payer: Medicare Other | Admitting: Cardiology

## 2017-08-11 VITALS — BP 167/65 | HR 51 | Ht 62.0 in | Wt 154.4 lb

## 2017-08-11 DIAGNOSIS — R001 Bradycardia, unspecified: Secondary | ICD-10-CM

## 2017-08-11 DIAGNOSIS — Z8673 Personal history of transient ischemic attack (TIA), and cerebral infarction without residual deficits: Secondary | ICD-10-CM

## 2017-08-11 DIAGNOSIS — I5032 Chronic diastolic (congestive) heart failure: Secondary | ICD-10-CM

## 2017-08-11 DIAGNOSIS — I480 Paroxysmal atrial fibrillation: Secondary | ICD-10-CM | POA: Diagnosis not present

## 2017-08-11 NOTE — Patient Instructions (Signed)
Your physician recommends that you schedule a follow-up appointment in: 6 WEEKS WITH DR MCDOWELL  Your physician has recommended you make the following change in your medication:   STOP TOPROL XL  Thank you for choosing [facility]!!

## 2017-08-12 NOTE — Addendum Note (Signed)
Addended by: Norva PavlovJOYCE, Buford Gayler on: 08/12/2017 09:19 AM   Modules accepted: Orders

## 2017-08-22 DIAGNOSIS — Z299 Encounter for prophylactic measures, unspecified: Secondary | ICD-10-CM | POA: Diagnosis not present

## 2017-08-22 DIAGNOSIS — I10 Essential (primary) hypertension: Secondary | ICD-10-CM | POA: Diagnosis not present

## 2017-08-22 DIAGNOSIS — Z713 Dietary counseling and surveillance: Secondary | ICD-10-CM | POA: Diagnosis not present

## 2017-08-22 DIAGNOSIS — I4891 Unspecified atrial fibrillation: Secondary | ICD-10-CM | POA: Diagnosis not present

## 2017-08-30 DIAGNOSIS — E7800 Pure hypercholesterolemia, unspecified: Secondary | ICD-10-CM | POA: Diagnosis not present

## 2017-08-30 DIAGNOSIS — I10 Essential (primary) hypertension: Secondary | ICD-10-CM | POA: Diagnosis not present

## 2017-08-30 DIAGNOSIS — I4891 Unspecified atrial fibrillation: Secondary | ICD-10-CM | POA: Diagnosis not present

## 2017-08-30 DIAGNOSIS — I509 Heart failure, unspecified: Secondary | ICD-10-CM | POA: Diagnosis not present

## 2017-09-19 DIAGNOSIS — Z299 Encounter for prophylactic measures, unspecified: Secondary | ICD-10-CM | POA: Diagnosis not present

## 2017-09-19 DIAGNOSIS — I5030 Unspecified diastolic (congestive) heart failure: Secondary | ICD-10-CM | POA: Diagnosis not present

## 2017-09-19 DIAGNOSIS — Z6827 Body mass index (BMI) 27.0-27.9, adult: Secondary | ICD-10-CM | POA: Diagnosis not present

## 2017-09-19 DIAGNOSIS — I10 Essential (primary) hypertension: Secondary | ICD-10-CM | POA: Diagnosis not present

## 2017-09-19 DIAGNOSIS — I4891 Unspecified atrial fibrillation: Secondary | ICD-10-CM | POA: Diagnosis not present

## 2017-09-26 NOTE — Progress Notes (Signed)
Cardiology Office Note  Date: 09/27/2017   ID: Danielle Brooks, DOB [birth year], MRN [id number]  PCP: Ignatius Specking, MD  Primary Cardiologist: Nona Dell, MD   Chief Complaint  Patient presents with  . Atrial Fibrillation  . Diastolic heart failure    History of Present Illness: Danielle Brooks is an 84 y.o. female that I last saw in February.  She is here today with her husband.  States that she feels fatigued, also short of breath intermittently during the daytime although her oxygen levels have been normal by report.  She is not sleeping well at all, this seems to be compounding how she feels.  She does have some good days if she is able to get more than 4 or 5 hours of sleep at night.  She does not report orthopnea or major weight gain.  States she has been compliant with diuretics.  No significant palpitations.  Toprol XL was stopped at the last visit.  Heart rate has been stable in the 50s to 60s.  She remains on Coumadin with follow-up by Dr. Sherril Croon.  She does not report any bleeding problems.  We did discuss follow-up lab work, chest x-ray and echocardiogram, particularly with her use of amiodarone and reported shortness of breath.  Past Medical History:  Diagnosis Date  . Atrial fibrillation (HCC)    Paroxysmal, history of normal LV and negative ischemic workup  . Chronic diastolic heart failure (HCC)    EF 55-60% w/severe pulmonary HTN noted on echo 10/20/16   . Mixed hyperlipidemia   . Osteoporosis   . TIA (transient ischemic attack)   . Vitamin D deficiency     History reviewed. No pertinent surgical history.  Current Outpatient Medications  Medication Sig Dispense Refill  . amiodarone (PACERONE) 200 MG tablet Take 1 tablet (200 mg total) by mouth daily. 90 tablet 3  . donepezil (ARICEPT) 5 MG tablet Take 5 mg by mouth daily.    . furosemide (LASIX) 20 MG tablet Take 20 mg by mouth daily.    . potassium chloride (K-DUR) 10 MEQ tablet Take 10-20 mEq by mouth daily.    0  . traZODone (DESYREL) 100 MG tablet Take 100 mg by mouth at bedtime.    . Vitamin D, Ergocalciferol, (DRISDOL) 50000 UNITS CAPS Take 50,000 Units by mouth every Tuesday.     . warfarin (COUMADIN) 5 MG tablet Take 2.5-5 mg by mouth See admin instructions. Take 1 tablet (5mg ) all days except on Wednesday take 1/2 tablet (2.5mg ) Managed by Sherril Croon office     No current facility-administered medications for this visit.    Allergies:  Bactrim [sulfamethoxazole-trimethoprim]; Codeine; and Novocain [procaine hcl]   Social History: The patient  reports that she has never smoked. She has never used smokeless tobacco. She reports that she does not drink alcohol or use drugs.   ROS:  Please see the history of present illness. Otherwise, complete review of systems is positive for hearing loss.  All other systems are reviewed and negative.   Physical Exam: VS:  BP (!) 168/51   Pulse (!) 58   Ht 5\' 2"  (1.575 m)   Wt 157 lb 9.6 oz (71.5 kg)   SpO2 100%   BMI 28.83 kg/m , BMI Body mass index is 28.83 kg/m.  Wt Readings from Last 3 Encounters:  09/27/17 157 lb 9.6 oz (71.5 kg)  08/11/17 154 lb 6.4 oz (70 kg)  05/19/17 159 lb 6.4 oz (72.3 kg)  General: Elderly woman using a walker. HEENT: Conjunctiva and lids normal, oropharynx clear. Neck: Supple, no elevated JVP or carotid bruits, no thyromegaly. Lungs: Decreased breath sounds at the bases, nonlabored breathing at rest. Cardiac: Regular rate and rhythm, no S3, soft systolic murmur. Abdomen: Soft, nontender, bowel sounds present. Extremities: Mild ankle edema, distal pulses 2+. Skin: Warm and dry. Musculoskeletal: No kyphosis. Neuropsychiatric: Alert and oriented x3, affect grossly appropriate.  ECG: I personally reviewed the tracing from 08/11/2017 which showed sinus bradycardia with low voltage.  Recent Labwork: 04/19/2017: ALT 25; AST 31; B Natriuretic Peptide 2,883.4; Hemoglobin 13.2; Magnesium 2.1; Platelets 329; TSH 2.032 04/21/2017:  BUN 15; Creatinine, Ser 0.84; Potassium 3.6; Sodium 138     Component Value Date/Time   CHOL 128 10/22/2016 0548   TRIG 85 10/22/2016 0548   HDL 39 (L) 10/22/2016 0548   CHOLHDL 3.3 10/22/2016 0548   VLDL 17 10/22/2016 0548   LDLCALC 72 10/22/2016 0548    Other Studies Reviewed Today:  Echocardiogram 04/21/2017: Study Conclusions  - Left ventricle: The cavity size was normal. Wall thickness was increased in a pattern of mild LVH. Systolic function was mildly reduced. The estimated ejection fraction was in the range of 45% to 50%. Diffuse hypokinesis. Doppler parameters are consistent with high ventricular filling pressure. - Aortic valve: There was moderate regurgitation. - Mitral valve: Calcified annulus. There was mild regurgitation. - Right ventricle: Systolic function was mildly reduced. - Pulmonary arteries: Systolic pressure was mildly increased. PA peak pressure: 39 mm Hg (S). - Pericardium, extracardiac: A trivial pericardial effusion was identified.  Impressions:  - Mild global reduction in LV systolic function; mild LVH; moderate AI; mild MR; mildly reduced RV function; mild TR with mild pulmonary hypertension.  Assessment and Plan:  1.  Recurring shortness of breath.  Weight is only fluctuating up or down by a few pounds and she reports consistent use of diuretics.  Heart rhythm has also been stable.  We stopped her Toprol-XL due to bradycardia previously.  Plan at this time is to obtain a CMET and TSH, also PA and lateral chest x-ray.  We will follow-up with an echocardiogram as well to ensure no change in LVEF.  2.  Paroxysmal atrial fibrillation.  She has been maintaining sinus rhythm on low-dose amiodarone and continues on Coumadin for stroke prophylaxis.  3.  Chronic diastolic heart failure, LVEF 45 to 50% by last assessment in February.  Follow-up echocardiogram to be obtained.  Current medicines were reviewed with the patient  today.   Orders Placed This Encounter  Procedures  . DG Chest 2 View  . Comprehensive Metabolic Panel (CMET)  . TSH  . ECHOCARDIOGRAM COMPLETE    Disposition: Follow-up in 6 weeks.  Signed, Jonelle SidleSamuel G. Forrester Blando, MD, [facility] 09/27/2017 4:12 PM    [facility] [address], [place], [place] [address] Phone: [phone](336) [phone]; Fax: [phone]

## 2017-09-27 ENCOUNTER — Ambulatory Visit (INDEPENDENT_AMBULATORY_CARE_PROVIDER_SITE_OTHER): Payer: Medicare Other | Admitting: Cardiology

## 2017-09-27 ENCOUNTER — Encounter: Payer: Self-pay | Admitting: Cardiology

## 2017-09-27 VITALS — BP 168/51 | HR 58 | Ht 62.0 in | Wt 157.6 lb

## 2017-09-27 DIAGNOSIS — R0602 Shortness of breath: Secondary | ICD-10-CM | POA: Diagnosis not present

## 2017-09-27 DIAGNOSIS — I429 Cardiomyopathy, unspecified: Secondary | ICD-10-CM

## 2017-09-27 DIAGNOSIS — I480 Paroxysmal atrial fibrillation: Secondary | ICD-10-CM

## 2017-09-27 DIAGNOSIS — I5032 Chronic diastolic (congestive) heart failure: Secondary | ICD-10-CM

## 2017-09-27 NOTE — Patient Instructions (Signed)
Medication Instructions:  Your physician recommends that you continue on your current medications as directed. Please refer to the Current Medication list given to you today.  Labwork: CMET/TSH Orders given today  Testing/Procedures: Your physician has requested that you have an echocardiogram. Echocardiography is a painless test that uses sound waves to create images of your heart. It provides your doctor with information about the size and shape of your heart and how well your heart's chambers and valves are working. This procedure takes approximately one hour. There are no restrictions for this procedure.  A chest x-ray takes a picture of the organs and structures inside the chest, including the heart, lungs, and blood vessels. This test can show several things, including, whether the heart is enlarges; whether fluid is building up in the lungs; and whether pacemaker / defibrillator leads are still in place.  Follow-Up: Your physician recommends that you schedule a follow-up appointment in: 6 WEEKS  Any Other Special Instructions Will Be Listed Below (If Applicable).  If you need a refill on your cardiac medications before your next appointment, please call your pharmacy.

## 2017-09-30 DIAGNOSIS — J90 Pleural effusion, not elsewhere classified: Secondary | ICD-10-CM | POA: Diagnosis not present

## 2017-09-30 DIAGNOSIS — I480 Paroxysmal atrial fibrillation: Secondary | ICD-10-CM | POA: Diagnosis not present

## 2017-09-30 DIAGNOSIS — R0602 Shortness of breath: Secondary | ICD-10-CM | POA: Diagnosis not present

## 2017-10-03 ENCOUNTER — Telehealth: Payer: Self-pay | Admitting: *Deleted

## 2017-10-03 NOTE — Telephone Encounter (Signed)
Pt aware - routed to pcp  

## 2017-10-03 NOTE — Telephone Encounter (Signed)
-----   Message from Ellsworth LennoxBrittany M Strader, [place]-C sent at 10/03/2017 11:06 AM EDT ----- Covering for Dr. Diona BrownerMcDowell - Please let the patient know her electrolytes, kidney function, and thyroid function were within normal limits. Continue with plans for echo as discussed at her office visit. Please forward a copy to Ignatius SpeckingVyas, Dhruv B, MD. Thank you.

## 2017-10-04 ENCOUNTER — Other Ambulatory Visit: Payer: Self-pay

## 2017-10-04 ENCOUNTER — Ambulatory Visit (INDEPENDENT_AMBULATORY_CARE_PROVIDER_SITE_OTHER): Payer: Medicare Other

## 2017-10-04 DIAGNOSIS — I480 Paroxysmal atrial fibrillation: Secondary | ICD-10-CM

## 2017-10-04 DIAGNOSIS — I429 Cardiomyopathy, unspecified: Secondary | ICD-10-CM

## 2017-10-05 ENCOUNTER — Other Ambulatory Visit: Payer: Self-pay | Admitting: *Deleted

## 2017-10-05 DIAGNOSIS — R0602 Shortness of breath: Secondary | ICD-10-CM

## 2017-10-06 ENCOUNTER — Telehealth: Payer: Self-pay

## 2017-10-06 NOTE — Telephone Encounter (Signed)
-----   Message from Ellsworth LennoxBrittany M Strader, [place]C sent at 10/05/2017  7:25 AM EDT ----- Covering for Dr. Diona BrownerMcDowell - Please let the patient know that her recent echocardiogram shows pumping function of the heart has actually improved since her last study as her previous EF was 45 to 50% and is now at 50 to 55% (normal range is between 55 - 65%).  Did have some leakage along the aortic valve which is similar to prior studies as well. The leakage along her mitral valve has progressed since her most recent study as she previously had mild regurgitation and this is now in a moderate to severe range.  Would keep scheduled follow-up with Dr. Diona BrownerMcDowell to review further.  Please forward a copy to Ignatius SpeckingVyas, Dhruv B, MD. Thank you.

## 2017-10-06 NOTE — Telephone Encounter (Signed)
-----   Message from Ellsworth LennoxBrittany M Strader, [place]C sent at 10/05/2017  4:49 PM EDT ----- Covering for Dr. Diona BrownerMcDowell - Please let the patient know that her CXR showed a persistent mild right pleural effusion but this was significantly improved as compared to prior imaging. No evidence of PNA. Continue with current Lasix dosing. Please forward a copy of this test to Ignatius SpeckingVyas, Dhruv B, MD. Thank you.

## 2017-10-06 NOTE — Telephone Encounter (Signed)
Patients husband notified. Routed to PCP  

## 2017-10-14 DIAGNOSIS — I4891 Unspecified atrial fibrillation: Secondary | ICD-10-CM | POA: Diagnosis not present

## 2017-10-14 DIAGNOSIS — I10 Essential (primary) hypertension: Secondary | ICD-10-CM | POA: Diagnosis not present

## 2017-10-14 DIAGNOSIS — I509 Heart failure, unspecified: Secondary | ICD-10-CM | POA: Diagnosis not present

## 2017-10-14 DIAGNOSIS — E7800 Pure hypercholesterolemia, unspecified: Secondary | ICD-10-CM | POA: Diagnosis not present

## 2017-10-17 DIAGNOSIS — I5030 Unspecified diastolic (congestive) heart failure: Secondary | ICD-10-CM | POA: Diagnosis not present

## 2017-10-17 DIAGNOSIS — Z6827 Body mass index (BMI) 27.0-27.9, adult: Secondary | ICD-10-CM | POA: Diagnosis not present

## 2017-10-17 DIAGNOSIS — N390 Urinary tract infection, site not specified: Secondary | ICD-10-CM | POA: Diagnosis not present

## 2017-10-17 DIAGNOSIS — I4891 Unspecified atrial fibrillation: Secondary | ICD-10-CM | POA: Diagnosis not present

## 2017-10-17 DIAGNOSIS — I10 Essential (primary) hypertension: Secondary | ICD-10-CM | POA: Diagnosis not present

## 2017-10-17 DIAGNOSIS — Z299 Encounter for prophylactic measures, unspecified: Secondary | ICD-10-CM | POA: Diagnosis not present

## 2017-11-03 NOTE — Progress Notes (Signed)
Cardiology Office Note  Date: 11/04/2017   ID: Danielle Brooks, DOB August 18, 1928, MRN [id number]  PCP: Ignatius Specking, MD  Primary Cardiologist: Nona Dell, MD   Chief Complaint  Patient presents with  . Cardiac follow-up    History of Present Illness: Danielle Brooks is an 84 y.o. female last seen in July.  She is here with her husband for a follow-up visit.  She remains chronically short of breath and fatigue, no palpitations or chest pain.  She is using a walker today.  Follow-up blood work, echocardiogram, and chest x-ray results are outlined below.  She had a small right pleural effusion that had improved overall, LVEF was 50 to 55% with diastolic dysfunction, she has mild aortic stenosis with moderate aortic regurgitation, and also progressive mitral valve regurgitation now in the range of moderate to severe.  I reviewed the results with him in detail.  She continues on Coumadin with follow-up per Dr. Sherril Croon.  No reported falls or bleeding problems.  Past Medical History:  Diagnosis Date  . Atrial fibrillation (HCC)    Paroxysmal, history of normal LV and negative ischemic workup  . Chronic diastolic heart failure (HCC)    EF 55-60% w/severe pulmonary HTN noted on echo 10/20/16   . Mixed hyperlipidemia   . Osteoporosis   . TIA (transient ischemic attack)   . Vitamin D deficiency     Past Surgical History:  Procedure Laterality Date  . DILATION AND CURETTAGE OF UTERUS  1999    Current Outpatient Medications  Medication Sig Dispense Refill  . amiodarone (PACERONE) 200 MG tablet Take 1 tablet (200 mg total) by mouth daily. 90 tablet 3  . donepezil (ARICEPT) 5 MG tablet Take 5 mg by mouth daily.    . furosemide (LASIX) 20 MG tablet Take 20 mg by mouth daily.    . potassium chloride (K-DUR) 10 MEQ tablet Take 10-20 mEq by mouth daily.   0  . traZODone (DESYREL) 100 MG tablet Take 100 mg by mouth at bedtime.    . Vitamin D, Ergocalciferol, (DRISDOL) 50000 UNITS CAPS Take 50,000  Units by mouth every Tuesday.     . warfarin (COUMADIN) 5 MG tablet Take 2.5-5 mg by mouth See admin instructions. Take 1 tablet (5mg ) all days except on Wednesday take 1/2 tablet (2.5mg ) Managed by Sherril Croon office     No current facility-administered medications for this visit.    Allergies:  Bactrim [sulfamethoxazole-trimethoprim]; Codeine; and Novocain [procaine hcl]   Social History: The patient  reports that she has never smoked. She has never used smokeless tobacco. She reports that she does not drink alcohol or use drugs.   ROS:  Please see the history of present illness. Otherwise, complete review of systems is positive for hearing loss, neuropathy.  All other systems are reviewed and negative.   Physical Exam: VS:  BP 138/80   Pulse (!) 56   Ht 5\' 2"  (1.575 m)   Wt 159 lb (72.1 kg)   SpO2 98%   BMI 29.08 kg/m , BMI Body mass index is 29.08 kg/m.  Wt Readings from Last 3 Encounters:  11/04/17 159 lb (72.1 kg)  09/27/17 157 lb 9.6 oz (71.5 kg)  08/11/17 154 lb 6.4 oz (70 kg)    General: Elderly woman, using a walker. HEENT: Conjunctiva and lids normal, oropharynx clear. Neck: Supple, no elevated JVP or carotid bruits, no thyromegaly. Lungs: Decreased breath sounds at the bases, nonlabored breathing at rest. Cardiac: Regular rate  and rhythm, no S3, soft systolic murmur. Abdomen: Soft, nontender, bowel sounds present. Extremities: Mild lower leg edema, distal pulses 2+. Skin: Warm and dry. Musculoskeletal: No kyphosis. Neuropsychiatric: Alert and oriented x3, affect grossly appropriate.  ECG: I personally reviewed the tracing from 08/11/2017 which showed sinus bradycardia with low voltage.  Recent Labwork: 04/19/2017: ALT 25; AST 31; B Natriuretic Peptide 2,883.4; Hemoglobin 13.2; Magnesium 2.1; Platelets 329; TSH 2.032 04/21/2017: BUN 15; Creatinine, Ser 0.84; Potassium 3.6; Sodium 138     Component Value Date/Time   CHOL 128 10/22/2016 0548   TRIG 85 10/22/2016 0548    HDL 39 (L) 10/22/2016 0548   CHOLHDL 3.3 10/22/2016 0548   VLDL 17 10/22/2016 0548   LDLCALC 72 10/22/2016 0548  August 2019: BUN 16, creatinine 0.87, potassium 3.6, TSH 1.92, AST 17, ALT 15  Other Studies Reviewed Today:  Chest x-ray 09/30/2017: Mild right pleural effusion which is significantly decreased compared to previous exam, no other acute abnormalities.  Echocardiogram 10/04/2017: Study Conclusions  - Left ventricle: The cavity size was normal. Wall thickness was   increased in a pattern of mild LVH. Systolic function was normal.   The estimated ejection fraction was in the range of 50% to 55%.   Wall motion was normal; there were no regional wall motion   abnormalities. Findings consistent with left ventricular   diastolic dysfunction, grade indeterminate. Doppler parameters   are consistent with high ventricular filling pressure. - Aortic valve: Mildly calcified annulus. Trileaflet; mildly   calcified leaflets. There was mild stenosis. There was moderate   regurgitation. Peak velocity (S): 207 cm/s. Mean gradient (S): 9   mm Hg. Valve area (VTI): 1.46 cm^2. Valve area (Vmax): 1.61 cm^2.   Valve area (Vmean): 1.44 cm^2. - Mitral valve: Mildly calcified annulus. Normal thickness leaflets   . There was moderate to severe regurgitation. - Left atrium: The atrium was mildly dilated. - Right ventricle: Systolic function was mildly reduced. - Right atrium: The atrium was mildly dilated. - Atrial septum: No defect or patent foramen ovale was identified. - Tricuspid valve: There was mild regurgitation.  Assessment and Plan:  1.  Shortness of breath.  Based on recent work-up previous right-sided pleural effusion has improved, lab work is stable, LVEF remains 50 to 55%, however her mitral regurgitation has progressed into the moderate to severe range.  We discussed this today.  She is obviously not a good surgical candidate.  Plan will be to continue medical therapy, we will increase  her Lasix to 40 mg alternating with 20 mg at least for the next few weeks to see if she notices any improvement in symptoms.  2.  Paroxysmal atrial fibrillation.  She is doing well in terms of rhythm control on amiodarone and continues on Coumadin for stroke prophylaxis.  3.  Chronic diastolic heart failure, LVEF is stable.  Weight is fluctuating only by few pounds up or down.  As noted above, we are going to try to advance her diuretic therapy somewhat more in light of her mitral regurgitation.  Current medicines were reviewed with the patient today.  Disposition: Follow up in 6 weeks.  Signed, Jonelle Sidle, MD, [facility] 11/04/2017 3:27 PM    [facility] at [facility] [address] [place], [place], [place] [address] Phone: [phone]; Fax: [phone]

## 2017-11-04 ENCOUNTER — Ambulatory Visit (INDEPENDENT_AMBULATORY_CARE_PROVIDER_SITE_OTHER): Payer: Medicare Other | Admitting: Cardiology

## 2017-11-04 ENCOUNTER — Encounter: Payer: Self-pay | Admitting: Cardiology

## 2017-11-04 VITALS — BP 138/80 | HR 56 | Ht 62.0 in | Wt 159.0 lb

## 2017-11-04 DIAGNOSIS — I480 Paroxysmal atrial fibrillation: Secondary | ICD-10-CM

## 2017-11-04 DIAGNOSIS — I340 Nonrheumatic mitral (valve) insufficiency: Secondary | ICD-10-CM | POA: Diagnosis not present

## 2017-11-04 DIAGNOSIS — I5032 Chronic diastolic (congestive) heart failure: Secondary | ICD-10-CM | POA: Diagnosis not present

## 2017-11-04 NOTE — Patient Instructions (Addendum)
Medication Instructions:   Your physician has recommended you make the following change in your medication:   Start alternating furosemide 40 mg with 20 mg every other day for 2 weeks. If this helps, contact our office so that we can send you a new prescription with these directions. If this does not help, just leave your dose at 20 mg by mouth daily.  Continue all other medications the same.  Labwork:  NONE  Testing/Procedures:  NONE  Follow-Up:  Your physician recommends that you schedule a follow-up appointment in: 6 weeks.  Any Other Special Instructions Will Be Listed Below (If Applicable).  If you need a refill on your cardiac medications before your next appointment, please call your pharmacy.

## 2017-11-11 DIAGNOSIS — E7800 Pure hypercholesterolemia, unspecified: Secondary | ICD-10-CM | POA: Diagnosis not present

## 2017-11-11 DIAGNOSIS — I509 Heart failure, unspecified: Secondary | ICD-10-CM | POA: Diagnosis not present

## 2017-11-11 DIAGNOSIS — I4891 Unspecified atrial fibrillation: Secondary | ICD-10-CM | POA: Diagnosis not present

## 2017-11-11 DIAGNOSIS — I10 Essential (primary) hypertension: Secondary | ICD-10-CM | POA: Diagnosis not present

## 2017-11-14 DIAGNOSIS — I4891 Unspecified atrial fibrillation: Secondary | ICD-10-CM | POA: Diagnosis not present

## 2017-11-14 DIAGNOSIS — Z713 Dietary counseling and surveillance: Secondary | ICD-10-CM | POA: Diagnosis not present

## 2017-11-14 DIAGNOSIS — Z299 Encounter for prophylactic measures, unspecified: Secondary | ICD-10-CM | POA: Diagnosis not present

## 2017-11-14 DIAGNOSIS — I10 Essential (primary) hypertension: Secondary | ICD-10-CM | POA: Diagnosis not present

## 2017-11-14 DIAGNOSIS — Z6827 Body mass index (BMI) 27.0-27.9, adult: Secondary | ICD-10-CM | POA: Diagnosis not present

## 2017-11-17 DIAGNOSIS — H43811 Vitreous degeneration, right eye: Secondary | ICD-10-CM | POA: Diagnosis not present

## 2017-11-22 ENCOUNTER — Telehealth: Payer: Self-pay | Admitting: Cardiology

## 2017-11-22 MED ORDER — FUROSEMIDE 20 MG PO TABS: ORAL_TABLET | ORAL | 1 refills | Status: DC

## 2017-11-22 NOTE — Telephone Encounter (Signed)
This dosage is relieving her SOB and swelling - will forward to provider if ok to send new rx for this dose

## 2017-11-22 NOTE — Telephone Encounter (Signed)
Patient husband called to tell us medication change doing good to please send in new prescription furosemide (LASIX) 20 MG tablet   [facility]

## 2017-11-22 NOTE — Telephone Encounter (Signed)
Pt has been alternating 20 mg and 40 mg of lasix every other day - and says

## 2017-11-22 NOTE — Addendum Note (Signed)
Addended by: Burman NievesASHWORTH, Roi Jafari T on: 11/22/2017 12:53 PM   Modules accepted: Orders

## 2017-11-22 NOTE — Telephone Encounter (Signed)
Yes, go ahead and refill at this dose.

## 2017-11-22 NOTE — Telephone Encounter (Signed)
Medication sent to pharmacy  

## 2017-12-03 DIAGNOSIS — Z79899 Other long term (current) drug therapy: Secondary | ICD-10-CM | POA: Diagnosis not present

## 2017-12-03 DIAGNOSIS — M25562 Pain in left knee: Secondary | ICD-10-CM | POA: Diagnosis not present

## 2017-12-03 DIAGNOSIS — I4891 Unspecified atrial fibrillation: Secondary | ICD-10-CM | POA: Diagnosis not present

## 2017-12-03 DIAGNOSIS — M1711 Unilateral primary osteoarthritis, right knee: Secondary | ICD-10-CM | POA: Diagnosis not present

## 2017-12-03 DIAGNOSIS — R52 Pain, unspecified: Secondary | ICD-10-CM | POA: Diagnosis not present

## 2017-12-03 DIAGNOSIS — M7651 Patellar tendinitis, right knee: Secondary | ICD-10-CM | POA: Diagnosis not present

## 2017-12-03 DIAGNOSIS — I10 Essential (primary) hypertension: Secondary | ICD-10-CM | POA: Diagnosis not present

## 2017-12-03 DIAGNOSIS — M25561 Pain in right knee: Secondary | ICD-10-CM | POA: Diagnosis not present

## 2017-12-06 DIAGNOSIS — Z6827 Body mass index (BMI) 27.0-27.9, adult: Secondary | ICD-10-CM | POA: Diagnosis not present

## 2017-12-06 DIAGNOSIS — M7121 Synovial cyst of popliteal space [Baker], right knee: Secondary | ICD-10-CM | POA: Diagnosis not present

## 2017-12-06 DIAGNOSIS — M7989 Other specified soft tissue disorders: Secondary | ICD-10-CM | POA: Diagnosis not present

## 2017-12-06 DIAGNOSIS — M25561 Pain in right knee: Secondary | ICD-10-CM | POA: Diagnosis not present

## 2017-12-06 DIAGNOSIS — I4891 Unspecified atrial fibrillation: Secondary | ICD-10-CM | POA: Diagnosis not present

## 2017-12-06 DIAGNOSIS — Z299 Encounter for prophylactic measures, unspecified: Secondary | ICD-10-CM | POA: Diagnosis not present

## 2017-12-09 DIAGNOSIS — M79604 Pain in right leg: Secondary | ICD-10-CM | POA: Diagnosis not present

## 2017-12-09 DIAGNOSIS — R600 Localized edema: Secondary | ICD-10-CM | POA: Diagnosis not present

## 2017-12-20 ENCOUNTER — Ambulatory Visit: Payer: Medicare Other | Admitting: Cardiology

## 2017-12-22 DIAGNOSIS — Z6827 Body mass index (BMI) 27.0-27.9, adult: Secondary | ICD-10-CM | POA: Diagnosis not present

## 2017-12-22 DIAGNOSIS — Z299 Encounter for prophylactic measures, unspecified: Secondary | ICD-10-CM | POA: Diagnosis not present

## 2017-12-22 DIAGNOSIS — M7121 Synovial cyst of popliteal space [Baker], right knee: Secondary | ICD-10-CM | POA: Diagnosis not present

## 2017-12-22 DIAGNOSIS — M660 Rupture of popliteal cyst: Secondary | ICD-10-CM | POA: Diagnosis not present

## 2017-12-22 DIAGNOSIS — I4891 Unspecified atrial fibrillation: Secondary | ICD-10-CM | POA: Diagnosis not present

## 2017-12-22 DIAGNOSIS — I10 Essential (primary) hypertension: Secondary | ICD-10-CM | POA: Diagnosis not present

## 2017-12-22 DIAGNOSIS — F0390 Unspecified dementia without behavioral disturbance: Secondary | ICD-10-CM | POA: Diagnosis not present

## 2017-12-22 DIAGNOSIS — Z2821 Immunization not carried out because of patient refusal: Secondary | ICD-10-CM | POA: Diagnosis not present

## 2017-12-29 DIAGNOSIS — I5030 Unspecified diastolic (congestive) heart failure: Secondary | ICD-10-CM | POA: Diagnosis not present

## 2017-12-29 DIAGNOSIS — I4891 Unspecified atrial fibrillation: Secondary | ICD-10-CM | POA: Diagnosis not present

## 2017-12-29 DIAGNOSIS — I10 Essential (primary) hypertension: Secondary | ICD-10-CM | POA: Diagnosis not present

## 2017-12-29 DIAGNOSIS — Z299 Encounter for prophylactic measures, unspecified: Secondary | ICD-10-CM | POA: Diagnosis not present

## 2017-12-29 DIAGNOSIS — Z6827 Body mass index (BMI) 27.0-27.9, adult: Secondary | ICD-10-CM | POA: Diagnosis not present

## 2017-12-29 DIAGNOSIS — F0390 Unspecified dementia without behavioral disturbance: Secondary | ICD-10-CM | POA: Diagnosis not present

## 2018-01-02 DIAGNOSIS — M79661 Pain in right lower leg: Secondary | ICD-10-CM | POA: Diagnosis not present

## 2018-01-02 DIAGNOSIS — S86111A Strain of other muscle(s) and tendon(s) of posterior muscle group at lower leg level, right leg, initial encounter: Secondary | ICD-10-CM | POA: Diagnosis not present

## 2018-01-06 DIAGNOSIS — S86111A Strain of other muscle(s) and tendon(s) of posterior muscle group at lower leg level, right leg, initial encounter: Secondary | ICD-10-CM | POA: Diagnosis not present

## 2018-01-06 DIAGNOSIS — M79661 Pain in right lower leg: Secondary | ICD-10-CM | POA: Diagnosis not present

## 2018-01-10 DIAGNOSIS — M79661 Pain in right lower leg: Secondary | ICD-10-CM | POA: Diagnosis not present

## 2018-01-10 DIAGNOSIS — S86111A Strain of other muscle(s) and tendon(s) of posterior muscle group at lower leg level, right leg, initial encounter: Secondary | ICD-10-CM | POA: Diagnosis not present

## 2018-01-12 DIAGNOSIS — M79661 Pain in right lower leg: Secondary | ICD-10-CM | POA: Diagnosis not present

## 2018-01-12 DIAGNOSIS — S86111A Strain of other muscle(s) and tendon(s) of posterior muscle group at lower leg level, right leg, initial encounter: Secondary | ICD-10-CM | POA: Diagnosis not present

## 2018-01-16 DIAGNOSIS — M79661 Pain in right lower leg: Secondary | ICD-10-CM | POA: Diagnosis not present

## 2018-01-16 DIAGNOSIS — S86111A Strain of other muscle(s) and tendon(s) of posterior muscle group at lower leg level, right leg, initial encounter: Secondary | ICD-10-CM | POA: Diagnosis not present

## 2018-01-23 DIAGNOSIS — M79661 Pain in right lower leg: Secondary | ICD-10-CM | POA: Diagnosis not present

## 2018-01-23 DIAGNOSIS — S86111A Strain of other muscle(s) and tendon(s) of posterior muscle group at lower leg level, right leg, initial encounter: Secondary | ICD-10-CM | POA: Diagnosis not present

## 2018-01-25 DIAGNOSIS — M79661 Pain in right lower leg: Secondary | ICD-10-CM | POA: Diagnosis not present

## 2018-01-25 DIAGNOSIS — S86111A Strain of other muscle(s) and tendon(s) of posterior muscle group at lower leg level, right leg, initial encounter: Secondary | ICD-10-CM | POA: Diagnosis not present

## 2018-01-27 DIAGNOSIS — Z299 Encounter for prophylactic measures, unspecified: Secondary | ICD-10-CM | POA: Diagnosis not present

## 2018-01-27 DIAGNOSIS — Z6827 Body mass index (BMI) 27.0-27.9, adult: Secondary | ICD-10-CM | POA: Diagnosis not present

## 2018-01-27 DIAGNOSIS — M1710 Unilateral primary osteoarthritis, unspecified knee: Secondary | ICD-10-CM | POA: Diagnosis not present

## 2018-01-27 DIAGNOSIS — I4891 Unspecified atrial fibrillation: Secondary | ICD-10-CM | POA: Diagnosis not present

## 2018-02-03 DIAGNOSIS — M79661 Pain in right lower leg: Secondary | ICD-10-CM | POA: Diagnosis not present

## 2018-02-03 DIAGNOSIS — S86111A Strain of other muscle(s) and tendon(s) of posterior muscle group at lower leg level, right leg, initial encounter: Secondary | ICD-10-CM | POA: Diagnosis not present

## 2018-02-07 DIAGNOSIS — S86111A Strain of other muscle(s) and tendon(s) of posterior muscle group at lower leg level, right leg, initial encounter: Secondary | ICD-10-CM | POA: Diagnosis not present

## 2018-02-07 DIAGNOSIS — M79661 Pain in right lower leg: Secondary | ICD-10-CM | POA: Diagnosis not present

## 2018-02-14 DIAGNOSIS — M79661 Pain in right lower leg: Secondary | ICD-10-CM | POA: Diagnosis not present

## 2018-02-14 DIAGNOSIS — S86111A Strain of other muscle(s) and tendon(s) of posterior muscle group at lower leg level, right leg, initial encounter: Secondary | ICD-10-CM | POA: Diagnosis not present

## 2018-02-15 ENCOUNTER — Telehealth: Payer: Self-pay | Admitting: Cardiology

## 2018-02-15 NOTE — Telephone Encounter (Signed)
Received call from [facility]. Patient is transferring all medications. [facility] called requesting a refill on warfarin (COUMADIN) 5 MG tablet

## 2018-02-15 NOTE — Telephone Encounter (Signed)
[facility] called stating to disregard refill request. They are sending it to PCP.

## 2018-02-16 DIAGNOSIS — S86111A Strain of other muscle(s) and tendon(s) of posterior muscle group at lower leg level, right leg, initial encounter: Secondary | ICD-10-CM | POA: Diagnosis not present

## 2018-02-16 DIAGNOSIS — M79661 Pain in right lower leg: Secondary | ICD-10-CM | POA: Diagnosis not present

## 2018-02-20 DIAGNOSIS — S86111A Strain of other muscle(s) and tendon(s) of posterior muscle group at lower leg level, right leg, initial encounter: Secondary | ICD-10-CM | POA: Diagnosis not present

## 2018-02-20 DIAGNOSIS — M79661 Pain in right lower leg: Secondary | ICD-10-CM | POA: Diagnosis not present

## 2018-03-02 DIAGNOSIS — M79661 Pain in right lower leg: Secondary | ICD-10-CM | POA: Diagnosis not present

## 2018-03-02 DIAGNOSIS — S86111A Strain of other muscle(s) and tendon(s) of posterior muscle group at lower leg level, right leg, initial encounter: Secondary | ICD-10-CM | POA: Diagnosis not present

## 2018-03-06 DIAGNOSIS — M79661 Pain in right lower leg: Secondary | ICD-10-CM | POA: Diagnosis not present

## 2018-03-06 DIAGNOSIS — S86111A Strain of other muscle(s) and tendon(s) of posterior muscle group at lower leg level, right leg, initial encounter: Secondary | ICD-10-CM | POA: Diagnosis not present

## 2018-03-09 DIAGNOSIS — M79661 Pain in right lower leg: Secondary | ICD-10-CM | POA: Diagnosis not present

## 2018-03-09 DIAGNOSIS — S86111A Strain of other muscle(s) and tendon(s) of posterior muscle group at lower leg level, right leg, initial encounter: Secondary | ICD-10-CM | POA: Diagnosis not present

## 2018-03-16 DIAGNOSIS — M79661 Pain in right lower leg: Secondary | ICD-10-CM | POA: Diagnosis not present

## 2018-03-16 DIAGNOSIS — S86111A Strain of other muscle(s) and tendon(s) of posterior muscle group at lower leg level, right leg, initial encounter: Secondary | ICD-10-CM | POA: Diagnosis not present

## 2018-03-17 DIAGNOSIS — M79661 Pain in right lower leg: Secondary | ICD-10-CM | POA: Diagnosis not present

## 2018-03-17 DIAGNOSIS — S86111A Strain of other muscle(s) and tendon(s) of posterior muscle group at lower leg level, right leg, initial encounter: Secondary | ICD-10-CM | POA: Diagnosis not present

## 2018-03-20 DIAGNOSIS — S86111A Strain of other muscle(s) and tendon(s) of posterior muscle group at lower leg level, right leg, initial encounter: Secondary | ICD-10-CM | POA: Diagnosis not present

## 2018-03-20 DIAGNOSIS — M79661 Pain in right lower leg: Secondary | ICD-10-CM | POA: Diagnosis not present

## 2018-03-21 DIAGNOSIS — I4891 Unspecified atrial fibrillation: Secondary | ICD-10-CM | POA: Diagnosis not present

## 2018-03-21 DIAGNOSIS — F419 Anxiety disorder, unspecified: Secondary | ICD-10-CM | POA: Diagnosis not present

## 2018-03-21 DIAGNOSIS — I10 Essential (primary) hypertension: Secondary | ICD-10-CM | POA: Diagnosis not present

## 2018-03-21 DIAGNOSIS — Z6827 Body mass index (BMI) 27.0-27.9, adult: Secondary | ICD-10-CM | POA: Diagnosis not present

## 2018-03-21 DIAGNOSIS — Z299 Encounter for prophylactic measures, unspecified: Secondary | ICD-10-CM | POA: Diagnosis not present

## 2018-04-18 DIAGNOSIS — I10 Essential (primary) hypertension: Secondary | ICD-10-CM | POA: Diagnosis not present

## 2018-04-18 DIAGNOSIS — I4891 Unspecified atrial fibrillation: Secondary | ICD-10-CM | POA: Diagnosis not present

## 2018-04-18 DIAGNOSIS — Z6827 Body mass index (BMI) 27.0-27.9, adult: Secondary | ICD-10-CM | POA: Diagnosis not present

## 2018-04-18 DIAGNOSIS — I5030 Unspecified diastolic (congestive) heart failure: Secondary | ICD-10-CM | POA: Diagnosis not present

## 2018-04-18 DIAGNOSIS — Z299 Encounter for prophylactic measures, unspecified: Secondary | ICD-10-CM | POA: Diagnosis not present

## 2018-05-09 ENCOUNTER — Ambulatory Visit (INDEPENDENT_AMBULATORY_CARE_PROVIDER_SITE_OTHER): Payer: Medicare Other | Admitting: Cardiology

## 2018-05-09 ENCOUNTER — Encounter: Payer: Self-pay | Admitting: Cardiology

## 2018-05-09 VITALS — BP 170/70 | HR 72 | Ht 62.0 in | Wt 156.0 lb

## 2018-05-09 DIAGNOSIS — I480 Paroxysmal atrial fibrillation: Secondary | ICD-10-CM

## 2018-05-09 DIAGNOSIS — I10 Essential (primary) hypertension: Secondary | ICD-10-CM

## 2018-05-09 DIAGNOSIS — I5032 Chronic diastolic (congestive) heart failure: Secondary | ICD-10-CM

## 2018-05-09 DIAGNOSIS — I340 Nonrheumatic mitral (valve) insufficiency: Secondary | ICD-10-CM

## 2018-05-09 MED ORDER — LOSARTAN POTASSIUM 25 MG PO TABS: 12.5000 mg | ORAL_TABLET | Freq: Every day | ORAL | 1 refills | Status: DC

## 2018-05-09 NOTE — Patient Instructions (Addendum)
Medication Instructions:   Your physician has recommended you make the following change in your medication:   Start losartan 12.5 mg by mouth daily  Continue all other medications the same  Labwork:  Your physician recommends that you return for lab work in: one week to check your BMET  Testing/Procedures: Your physician has requested that you have an echocardiogram in 4 months just before your next visit. Echocardiography is a painless test that uses sound waves to create images of your heart. It provides your doctor with information about the size and shape of your heart and how well your heart's chambers and valves are working. This procedure takes approximately one hour. There are no restrictions for this procedure.  Follow-Up:  Your physician recommends that you schedule a follow-up appointment in: 4 months. You will receive a reminder letter in the mail in about 2 months reminding you to call and schedule your appointment. If you don't receive this letter, please contact our office.  Any Other Special Instructions Will Be Listed Below (If Applicable).  If you need a refill on your cardiac medications before your next appointment, please call your pharmacy.

## 2018-05-09 NOTE — Progress Notes (Signed)
Cardiology Office Note  Date: 05/09/2018   ID: Danielle Brooks, DOB 1928-05-29, MRN [id number]  PCP: Ignatius Specking, MD  Primary Cardiologist: Danielle Dell, MD   Chief Complaint  Patient presents with  . Atrial Fibrillation    History of Present Illness: Danielle Brooks is an 85 y.o. female last seen in September 2019.  She is here today with her husband for a follow-up visit.  She does not report any chest pain or palpitations.  She states that she had a ruptured Baker's cyst in her right leg since I saw her, underwent physical therapy, using a walker to get around now.  She denies any falls.  She also states that her blood pressure has been trending up over the last month.  She is currently not on any antihypertensive medications.  She is on Coumadin with follow-up by Dr. Sherril Croon.  She denies any other spontaneous bleeding problems.  Past Medical History:  Diagnosis Date  . Atrial fibrillation (HCC)    Paroxysmal, history of normal LV and negative ischemic workup  . Chronic diastolic heart failure (HCC)    EF 55-60% w/severe pulmonary HTN noted on echo 10/20/16   . Mixed hyperlipidemia   . Osteoporosis   . TIA (transient ischemic attack)   . Vitamin D deficiency     Past Surgical History:  Procedure Laterality Date  . DILATION AND CURETTAGE OF UTERUS  1999    Current Outpatient Medications  Medication Sig Dispense Refill  . amiodarone (PACERONE) 200 MG tablet Take 1 tablet (200 mg total) by mouth daily. 90 tablet 3  . donepezil (ARICEPT) 5 MG tablet Take 5 mg by mouth daily.    . furosemide (LASIX) 20 MG tablet TAKE 20 MG EVERY OTHER DAY ALTERNATING WITH 40 MG EVERY OTHER DAY 180 tablet 1  . potassium chloride (K-DUR) 10 MEQ tablet Take 10-20 mEq by mouth daily.   0  . traZODone (DESYREL) 100 MG tablet Take 100 mg by mouth at bedtime.    . Vitamin D, Ergocalciferol, (DRISDOL) 50000 UNITS CAPS Take 50,000 Units by mouth every Tuesday.     . warfarin (COUMADIN) 5 MG tablet Take  2.5-5 mg by mouth See admin instructions. Take 1 tablet (5mg ) all days except on Wednesday take 1/2 tablet (2.5mg ) Managed by [facility] office    . losartan (COZAAR) 25 MG tablet Take 0.5 tablets (12.5 mg total) by mouth daily. 45 tablet 1   No current facility-administered medications for this visit.    Allergies:  Bactrim [sulfamethoxazole-trimethoprim]; Codeine; and Novocain [procaine hcl]   Social History: The patient  reports that she has never smoked. She has never used smokeless tobacco. She reports that she does not drink alcohol or use drugs.   ROS:  Please see the history of present illness. Otherwise, complete review of systems is positive for anxiety, hearing loss.  All other systems are reviewed and negative.   Physical Exam: VS:  BP (!) 170/70   Pulse 72   Ht 5\' 2"  (1.575 m)   Wt 156 lb (70.8 kg)   SpO2 98%   BMI 28.53 kg/m , BMI Body mass index is 28.53 kg/m.  Wt Readings from Last 3 Encounters:  05/09/18 156 lb (70.8 kg)  11/04/17 159 lb (72.1 kg)  09/27/17 157 lb 9.6 oz (71.5 kg)    General: Elderly woman in wheelchair. HEENT: Conjunctiva and lids normal, oropharynx clear. Neck: Supple, no elevated JVP or carotid bruits, no thyromegaly. Lungs: Clear to auscultation,  nonlabored breathing at rest. Cardiac: Regular rate and rhythm, no S3, soft systolic murmur. Abdomen: Soft, nontender, bowel sounds present. Extremities: Mild ankle edema appears stable, distal pulses 2+. Skin: Warm and dry. Musculoskeletal: No kyphosis. Neuropsychiatric: Alert and oriented x3, affect grossly appropriate.  ECG: I personally reviewed the tracing from 13/2019 which showed sinus bradycardia with borderline low voltage and decreased R wave progression.  Recent Labwork:  August 2019: BUN 16, creatinine 0.87, potassium 3.6, TSH 1.92, AST 17, ALT 15  Other Studies Reviewed Today:  Echocardiogram 10/04/2017: Study Conclusions  - Left ventricle: The cavity size was normal. Wall thickness  was increased in a pattern of mild LVH. Systolic function was normal. The estimated ejection fraction was in the range of 50% to 55%. Wall motion was normal; there were no regional wall motion abnormalities. Findings consistent with left ventricular diastolic dysfunction, grade indeterminate. Doppler parameters are consistent with high ventricular filling pressure. - Aortic valve: Mildly calcified annulus. Trileaflet; mildly calcified leaflets. There was mild stenosis. There was moderate regurgitation. Peak velocity (S): 207 cm/s. Mean gradient (S): 9 mm Hg. Valve area (VTI): 1.46 cm^2. Valve area (Vmax): 1.61 cm^2. Valve area (Vmean): 1.44 cm^2. - Mitral valve: Mildly calcified annulus. Normal thickness leaflets . There was moderate to severe regurgitation. - Left atrium: The atrium was mildly dilated. - Right ventricle: Systolic function was mildly reduced. - Right atrium: The atrium was mildly dilated. - Atrial septum: No defect or patent foramen ovale was identified. - Tricuspid valve: There was mild regurgitation.  Assessment and Plan:  1.  Paroxysmal atrial fibrillation.  She reports no palpitations and has been maintaining sinus rhythm on amiodarone.  Continue Coumadin for stroke prophylaxis.  2.  Essential hypertension.  Plan to add losartan beginning at 12.5 mg daily with follow-up BMET.  3.  Chronic diastolic heart failure, LVEF 50 to 55% by last assessment.  Weight has been stable.  She remains on a stable dose of diuretic.  4.  Mitral valve disease, moderate to severe mitral regurgitation as of August 2019.  Follow-up echocardiogram for her next visit.  Current medicines were reviewed with the patient today.   Orders Placed This Encounter  Procedures  . Basic metabolic panel  . ECHOCARDIOGRAM COMPLETE    Disposition: Follow-up in 4 months.  Signed, Jonelle Sidle, MD, [facility] 05/09/2018 2:15 PM    [facility] at  [facility] [address] [place], [place], [place] [address] Phone: [phone]; Fax: [phone]

## 2018-05-17 DIAGNOSIS — I10 Essential (primary) hypertension: Secondary | ICD-10-CM | POA: Diagnosis not present

## 2018-05-17 DIAGNOSIS — I5030 Unspecified diastolic (congestive) heart failure: Secondary | ICD-10-CM | POA: Diagnosis not present

## 2018-05-17 DIAGNOSIS — Z713 Dietary counseling and surveillance: Secondary | ICD-10-CM | POA: Diagnosis not present

## 2018-05-17 DIAGNOSIS — I4891 Unspecified atrial fibrillation: Secondary | ICD-10-CM | POA: Diagnosis not present

## 2018-05-17 DIAGNOSIS — I480 Paroxysmal atrial fibrillation: Secondary | ICD-10-CM | POA: Diagnosis not present

## 2018-05-17 DIAGNOSIS — Z299 Encounter for prophylactic measures, unspecified: Secondary | ICD-10-CM | POA: Diagnosis not present

## 2018-05-18 ENCOUNTER — Telehealth: Payer: Self-pay | Admitting: *Deleted

## 2018-05-18 NOTE — Telephone Encounter (Signed)
-----   Message from Ellsworth Lennox, [place] sent at 05/18/2018  7:28 AM EDT ----- Covering for [name] - Please let the patient know her electrolytes and kidney function remain within normal limits. Continue current medication regimen for now. Forward a copy of results to Ignatius Specking, MD. Thank you.

## 2018-05-23 ENCOUNTER — Encounter: Payer: Self-pay | Admitting: *Deleted

## 2018-05-23 NOTE — Telephone Encounter (Signed)
Notified by letter. Copy sent to PCP 

## 2018-06-14 ENCOUNTER — Other Ambulatory Visit: Payer: Self-pay | Admitting: Physician Assistant

## 2018-06-14 DIAGNOSIS — I10 Essential (primary) hypertension: Secondary | ICD-10-CM | POA: Diagnosis not present

## 2018-06-14 DIAGNOSIS — I4891 Unspecified atrial fibrillation: Secondary | ICD-10-CM | POA: Diagnosis not present

## 2018-06-14 DIAGNOSIS — Z299 Encounter for prophylactic measures, unspecified: Secondary | ICD-10-CM | POA: Diagnosis not present

## 2018-06-14 DIAGNOSIS — Z713 Dietary counseling and surveillance: Secondary | ICD-10-CM | POA: Diagnosis not present

## 2018-06-27 ENCOUNTER — Telehealth: Payer: Self-pay | Admitting: Cardiology

## 2018-06-27 NOTE — Telephone Encounter (Signed)
Per husband, patient's losartan was increased to 25 mg daily 2 weeks ago and patient is needing a new prescription sent to the pharmacy for these directions. Husband advised to contact Dr. Bonnita Levan office who increased the dose, and have them send in a new prescription. Verbalized understanding.

## 2018-06-27 NOTE — Telephone Encounter (Signed)
Has questions about how to continue to take medication doctor last prescribed her.  Dr Sherril Croon has increased does to a pill a day.   Think medication is name losartan (COZAAR) 25 MG tablet

## 2018-07-12 DIAGNOSIS — I4891 Unspecified atrial fibrillation: Secondary | ICD-10-CM | POA: Diagnosis not present

## 2018-07-12 DIAGNOSIS — I10 Essential (primary) hypertension: Secondary | ICD-10-CM | POA: Diagnosis not present

## 2018-07-12 DIAGNOSIS — R350 Frequency of micturition: Secondary | ICD-10-CM | POA: Diagnosis not present

## 2018-07-12 DIAGNOSIS — Z713 Dietary counseling and surveillance: Secondary | ICD-10-CM | POA: Diagnosis not present

## 2018-07-12 DIAGNOSIS — Z299 Encounter for prophylactic measures, unspecified: Secondary | ICD-10-CM | POA: Diagnosis not present

## 2018-08-22 DIAGNOSIS — I4891 Unspecified atrial fibrillation: Secondary | ICD-10-CM | POA: Diagnosis not present

## 2018-08-22 DIAGNOSIS — Z299 Encounter for prophylactic measures, unspecified: Secondary | ICD-10-CM | POA: Diagnosis not present

## 2018-08-22 DIAGNOSIS — Z713 Dietary counseling and surveillance: Secondary | ICD-10-CM | POA: Diagnosis not present

## 2018-08-22 DIAGNOSIS — I10 Essential (primary) hypertension: Secondary | ICD-10-CM | POA: Diagnosis not present

## 2018-09-07 DIAGNOSIS — I10 Essential (primary) hypertension: Secondary | ICD-10-CM | POA: Diagnosis not present

## 2018-09-07 DIAGNOSIS — I509 Heart failure, unspecified: Secondary | ICD-10-CM | POA: Diagnosis not present

## 2018-09-07 DIAGNOSIS — I4891 Unspecified atrial fibrillation: Secondary | ICD-10-CM | POA: Diagnosis not present

## 2018-09-07 DIAGNOSIS — E7800 Pure hypercholesterolemia, unspecified: Secondary | ICD-10-CM | POA: Diagnosis not present

## 2018-09-12 ENCOUNTER — Telehealth: Payer: Self-pay | Admitting: Cardiology

## 2018-09-12 NOTE — Telephone Encounter (Signed)

## 2018-09-13 ENCOUNTER — Ambulatory Visit (INDEPENDENT_AMBULATORY_CARE_PROVIDER_SITE_OTHER): Payer: Medicare Other

## 2018-09-13 DIAGNOSIS — I480 Paroxysmal atrial fibrillation: Secondary | ICD-10-CM | POA: Diagnosis not present

## 2018-09-13 DIAGNOSIS — I340 Nonrheumatic mitral (valve) insufficiency: Secondary | ICD-10-CM

## 2018-09-15 ENCOUNTER — Telehealth: Payer: Self-pay | Admitting: *Deleted

## 2018-09-15 NOTE — Telephone Encounter (Signed)
Danielle Brooks returned call to [name].

## 2018-09-15 NOTE — Telephone Encounter (Signed)
Notes recorded by Laurine Blazer, LPN on 08/18/5091 at 2:67 PM EDT  Patient notified. Copy to pmd. Follow up scheduled for 10/27/2018 with Dr. Domenic Polite in [place] office.

## 2018-09-15 NOTE — Telephone Encounter (Signed)
Notes recorded by Laurine Blazer, LPN on 2/57/5051 at 83:35 AM EDT  Left message to return call.   ------   Notes recorded by Erma Heritage, PA-C on 09/14/2018 at 7:38 AM EDT  Covering for Dr. Domenic Polite - Please let the patient know her echocardiogram showed a preserved EF of 60 to 65% with no regional wall motion abnormalities. Her mitral valve regurgitation remains moderate which is similar to her prior study. Please forward a copy of results to Glenda Chroman, MD.

## 2018-09-21 DIAGNOSIS — Z6827 Body mass index (BMI) 27.0-27.9, adult: Secondary | ICD-10-CM | POA: Diagnosis not present

## 2018-09-21 DIAGNOSIS — Z299 Encounter for prophylactic measures, unspecified: Secondary | ICD-10-CM | POA: Diagnosis not present

## 2018-09-21 DIAGNOSIS — I4891 Unspecified atrial fibrillation: Secondary | ICD-10-CM | POA: Diagnosis not present

## 2018-09-21 DIAGNOSIS — Z713 Dietary counseling and surveillance: Secondary | ICD-10-CM | POA: Diagnosis not present

## 2018-10-05 DIAGNOSIS — I4891 Unspecified atrial fibrillation: Secondary | ICD-10-CM | POA: Diagnosis not present

## 2018-10-05 DIAGNOSIS — E7800 Pure hypercholesterolemia, unspecified: Secondary | ICD-10-CM | POA: Diagnosis not present

## 2018-10-05 DIAGNOSIS — I10 Essential (primary) hypertension: Secondary | ICD-10-CM | POA: Diagnosis not present

## 2018-10-05 DIAGNOSIS — I509 Heart failure, unspecified: Secondary | ICD-10-CM | POA: Diagnosis not present

## 2018-10-23 DIAGNOSIS — I10 Essential (primary) hypertension: Secondary | ICD-10-CM | POA: Diagnosis not present

## 2018-10-23 DIAGNOSIS — I4891 Unspecified atrial fibrillation: Secondary | ICD-10-CM | POA: Diagnosis not present

## 2018-10-23 DIAGNOSIS — Z299 Encounter for prophylactic measures, unspecified: Secondary | ICD-10-CM | POA: Diagnosis not present

## 2018-10-23 DIAGNOSIS — Z6827 Body mass index (BMI) 27.0-27.9, adult: Secondary | ICD-10-CM | POA: Diagnosis not present

## 2018-10-27 ENCOUNTER — Ambulatory Visit (INDEPENDENT_AMBULATORY_CARE_PROVIDER_SITE_OTHER): Payer: Medicare Other | Admitting: Cardiology

## 2018-10-27 ENCOUNTER — Other Ambulatory Visit: Payer: Self-pay | Admitting: *Deleted

## 2018-10-27 ENCOUNTER — Other Ambulatory Visit: Payer: Self-pay

## 2018-10-27 ENCOUNTER — Encounter: Payer: Self-pay | Admitting: Cardiology

## 2018-10-27 VITALS — BP 151/69 | HR 66 | Ht 62.0 in | Wt 185.2 lb

## 2018-10-27 DIAGNOSIS — I480 Paroxysmal atrial fibrillation: Secondary | ICD-10-CM

## 2018-10-27 DIAGNOSIS — I5033 Acute on chronic diastolic (congestive) heart failure: Secondary | ICD-10-CM | POA: Diagnosis not present

## 2018-10-27 DIAGNOSIS — I10 Essential (primary) hypertension: Secondary | ICD-10-CM

## 2018-10-27 MED ORDER — FUROSEMIDE 20 MG PO TABS: 40.0000 mg | ORAL_TABLET | Freq: Every day | ORAL | 1 refills | Status: DC

## 2018-10-27 NOTE — Patient Instructions (Addendum)
Medication Instructions:    Your physician recommends that you continue on your current medications as directed. Please refer to the Current Medication list given to you today.  Please take your furosemide 40 mg daily instead of 20 mg  Labwork:  NONE  Testing/Procedures:  NONE  Follow-Up:  Your physician recommends that you schedule a follow-up appointment in: 2-3 weeks with Dr. Domenic Polite or one of his extenders.  Any Other Special Instructions Will Be Listed Below (If Applicable).  If you need a refill on your cardiac medications before your next appointment, please call your pharmacy.

## 2018-10-27 NOTE — Progress Notes (Signed)
Cardiology Office Note  Date: 10/27/2018   ID: Danielle Brooks, DOB June 22, 1928, MRN [id number]  PCP:  Glenda Chroman, MD  Cardiologist:  Rozann Lesches, MD Electrophysiologist:  None   Chief Complaint  Patient presents with  . Cardiac follow-up    History of Present Illness: Danielle Brooks is an 85 y.o. female last seen in March.  She presents for a follow-up visit.  She has gained a substantial amount of weight, only able to take Lasix at 20 mg daily, she states that if she takes a higher dose she feels "weak."  She does not report any excessive salt intake.  She continues on Coumadin with follow-up by Dr. Woody Seller.  No bleeding problems reported.  I personally reviewed her ECG today which shows sinus rhythm with prolonged PR interval and poor R wave progression.  Fortunately, she does not report any sense of palpitations.  She has been on amiodarone for rhythm suppression.  Past Medical History:  Diagnosis Date  . Atrial fibrillation (HCC)    Paroxysmal, history of normal LV and negative ischemic workup  . Chronic diastolic heart failure (HCC)    EF 55-60% w/severe pulmonary HTN noted on echo 10/20/16   . Mixed hyperlipidemia   . Osteoporosis   . TIA (transient ischemic attack)   . Vitamin D deficiency     Past Surgical History:  Procedure Laterality Date  . DILATION AND CURETTAGE OF UTERUS  1999    Current Outpatient Medications  Medication Sig Dispense Refill  . amiodarone (PACERONE) 200 MG tablet Take 200 mg by mouth every morning.    . donepezil (ARICEPT) 5 MG tablet Take 5 mg by mouth daily.    . furosemide (LASIX) 20 MG tablet Take 2 tablets (40 mg total) by mouth daily. 180 tablet 1  . losartan (COZAAR) 25 MG tablet Take 25 mg by mouth daily.    . potassium chloride (K-DUR) 10 MEQ tablet Take 10-20 mEq by mouth daily.   0  . traZODone (DESYREL) 100 MG tablet Take 100 mg by mouth at bedtime as needed.     . Vitamin D, Ergocalciferol, (DRISDOL) 50000 UNITS CAPS Take  50,000 Units by mouth every Tuesday.     . warfarin (COUMADIN) 5 MG tablet Take 2.5-5 mg by mouth See admin instructions. Take 1 tablet (5mg ) all days except on Wednesday take 1/2 tablet (2.5mg ) Managed by [facility] office     No current facility-administered medications for this visit.    Allergies:  Bactrim [sulfamethoxazole-trimethoprim], Codeine, and Novocain [procaine hcl]   Social History: The patient  reports that she has never smoked. She has never used smokeless tobacco. She reports that she does not drink alcohol or use drugs.   ROS:  Please see the history of present illness. Otherwise, complete review of systems is positive for none.  All other systems are reviewed and negative.   Physical Exam: VS:  BP (!) 151/69   Pulse 66   Ht 5\' 2"  (1.575 m)   Wt 185 lb 3.2 oz (84 kg)   SpO2 97%   BMI 33.87 kg/m , BMI Body mass index is 33.87 kg/m.  Wt Readings from Last 3 Encounters:  10/27/18 185 lb 3.2 oz (84 kg)  05/09/18 156 lb (70.8 kg)  11/04/17 159 lb (72.1 kg)    General: Elderly woman in wheelchair. HEENT: Conjunctiva and lids normal, wearing a mask.. Neck: Supple, elevated JVP, no carotid bruits. Lungs: Decreased breath sounds at the bases. Cardiac: Regular  rate and rhythm, no gallop. Abdomen: Protuberant, nontender, bowel sounds present. Extremities: 2-3+ leg edema. Skin: Warm and dry. Musculoskeletal: No kyphosis. Neuropsychiatric: Alert and oriented x3, affect grossly appropriate.  ECG:  An ECG dated 08/11/2017 was personally reviewed today and demonstrated:  Sinus bradycardia with low voltage.  Recent Labwork:  March 2020: BUN 16, creatinine 0.68, potassium 3.6  Other Studies Reviewed Today:  Echocardiogram 09/13/2018:  1. The left ventricle has normal systolic function with an ejection fraction of 60-65%. The cavity size was normal. Left ventricular diastolic Doppler parameters are consistent with pseudonormalization. Elevated mean left atrial pressure.  2. The  right ventricle has normal systolic function. The cavity was normal. There is no increase in right ventricular wall thickness.  3. Left atrial size was severely dilated.  4. Right atrial size was mildly dilated.  5. The aortic valve is tricuspid. Moderate thickening of the aortic valve. Moderate calcification of the aortic valve. Aortic valve regurgitation is moderate by color flow Doppler. No stenosis of the aortic valve. Moderate aortic annular calcification  noted.  6. The mitral valve is abnormal. Mild thickening of the mitral valve leaflet. Mild calcification of the mitral valve leaflet. There is mild to moderate mitral annular calcification present. Mitral valve regurgitation is moderate by color flow Doppler.  7. The aortic root is normal in size and structure.  8. Pulmonary hypertension is indeterminant, inadequate TR jet.  Assessment and Plan:  1.  Progressive shortness of breath with significant fluid gain.  She has acute on chronic diastolic heart failure.  Recent echocardiogram demonstrated LVEF 60 to 65% with moderate diastolic dysfunction, normal RV contraction, and overall moderate mitral regurgitation.  I am concerned that she is going to require hospitalization for IV diuresis.  She would like to try to advance Lasix further as an outpatient first, we will increase to 40 mg daily with potassium supplements, if her weight does not start to come down or she becomes more short of breath I have asked her to present to the ER for further assessment.  Otherwise we will see her back in the office.  2.  Paroxysmal atrial fibrillation.  She is in sinus rhythm today on amiodarone and continues on Coumadin for stroke prophylaxis.  3.  Essential hypertension, tolerating addition of Cozaar.  Renal function was stable in March.  4.  Mitral valve disease, moderate by most recent follow-up evaluation.  Medication Adjustments/Labs and Tests Ordered: Current medicines are reviewed at length with  the patient today.  Concerns regarding medicines are outlined above.   Tests Ordered: Orders Placed This Encounter  Procedures  . EKG 12-Lead    Medication Changes: Meds ordered this encounter  Medications  . furosemide (LASIX) 20 MG tablet    Sig: Take 2 tablets (40 mg total) by mouth daily.    Dispense:  180 tablet    Refill:  1    Disposition:  Follow up in the next few weeks.  Signed, Jonelle SidleSamuel G. McDowell, MD, [facility] 10/27/2018 3:33 PM    [facility] [address], [place], [place] [address] Phone: [phone]) [phone]; Fax: [phone]

## 2018-11-15 ENCOUNTER — Ambulatory Visit (INDEPENDENT_AMBULATORY_CARE_PROVIDER_SITE_OTHER): Payer: Medicare Other | Admitting: Cardiology

## 2018-11-15 ENCOUNTER — Other Ambulatory Visit: Payer: Self-pay

## 2018-11-15 ENCOUNTER — Encounter: Payer: Self-pay | Admitting: Cardiology

## 2018-11-15 VITALS — BP 150/60 | HR 72 | Ht 62.0 in | Wt 184.0 lb

## 2018-11-15 DIAGNOSIS — I480 Paroxysmal atrial fibrillation: Secondary | ICD-10-CM | POA: Diagnosis not present

## 2018-11-15 DIAGNOSIS — I10 Essential (primary) hypertension: Secondary | ICD-10-CM

## 2018-11-15 DIAGNOSIS — I5032 Chronic diastolic (congestive) heart failure: Secondary | ICD-10-CM

## 2018-11-15 MED ORDER — METOLAZONE 2.5 MG PO TABS: 2.5000 mg | ORAL_TABLET | ORAL | 3 refills | Status: DC

## 2018-11-15 NOTE — Patient Instructions (Addendum)
Medication Instructions:   Your physician has recommended you make the following change in your medication:   Start metolazone 2.5 mg by mouth 2 days per week on Monday and Thursday  Continue all other medications the same  Labwork:  Your physician recommends that you return for non-fasting lab work in: 2 weeks to check your BMET. You may have this done at [facility].  Testing/Procedures:  NONE  Follow-Up:  Your physician recommends that you schedule a follow-up appointment in: 6-8 weeks.   Any Other Special Instructions Will Be Listed Below (If Applicable).  If you need a refill on your cardiac medications before your next appointment, please call your pharmacy.

## 2018-11-15 NOTE — Progress Notes (Signed)
Cardiology Office Note  Date: 11/15/2018   ID: Danielle Brooks, DOB Nov 21, 1928, MRN [id number]  PCP:  Danielle Brooks, Danielle B, MD  Cardiologist:  Danielle DellSamuel Sheena Simonis, MD Electrophysiologist:  None   Chief Complaint  Patient presents with  . Cardiac follow-up    History of Present Illness: Danielle Brooks is an 85 y.o. female last seen in August and presenting for a follow-up visit.  She tells me that she feels better since we increase Lasix to 40 mg daily, although her weight has not changed much at all, down only a pound.  She states that she is taking the medication as directed.  She remains on Coumadin with follow-up by Dr. Sherril Brooks.  No reported bleeding problems.  She does not want to consider hospitalization for IV diuresis at this point.  We will continue with medical therapy, I did talk with her about adding metolazone twice a week.  Past Medical History:  Diagnosis Date  . Atrial fibrillation (HCC)    Paroxysmal, history of normal LV and negative ischemic workup  . Chronic diastolic heart failure (HCC)    EF 55-60% w/severe pulmonary HTN noted on echo 10/20/16   . Mixed hyperlipidemia   . Osteoporosis   . TIA (transient ischemic attack)   . Vitamin D deficiency     Past Surgical History:  Procedure Laterality Date  . DILATION AND CURETTAGE OF UTERUS  1999    Current Outpatient Medications  Medication Sig Dispense Refill  . amiodarone (PACERONE) 200 MG tablet Take 200 mg by mouth every morning.    . donepezil (ARICEPT) 5 MG tablet Take 5 mg by mouth daily.    . furosemide (LASIX) 20 MG tablet Take 2 tablets (40 mg total) by mouth daily. 180 tablet 1  . losartan (COZAAR) 25 MG tablet Take 25 mg by mouth daily.    . potassium chloride (K-DUR) 10 MEQ tablet Take 10-20 mEq by mouth daily.   0  . traZODone (DESYREL) 100 MG tablet Take 100 mg by mouth at bedtime as needed.     . Vitamin D, Ergocalciferol, (DRISDOL) 50000 UNITS CAPS Take 50,000 Units by mouth every Tuesday.     . warfarin  (COUMADIN) 5 MG tablet Take 2.5-5 mg by mouth See admin instructions. Take 1 tablet (5mg ) all days except on Wednesday take 1/2 tablet (2.5mg ) [facility]    . [START ON 11/16/2018] metolazone (ZAROXOLYN) 2.5 MG tablet Take 1 tablet (2.5 mg total) by mouth 2 (two) times a week. On Monday & Thursday 8 tablet 3   No current facility-administered medications for this visit.    Allergies:  Bactrim [sulfamethoxazole-trimethoprim], Codeine, and Novocain [procaine hcl]   Social History: The patient  reports that she has never smoked. She has never used smokeless tobacco. She reports that she does not drink alcohol or use drugs.   ROS:  Please see the history of present illness. Otherwise, complete review of systems is positive for hearing loss and memory loss.  All other systems are reviewed and negative.   Physical Exam: VS:  BP (!) 150/60   Pulse 72   Ht 5\' 2"  (1.575 m)   Wt 184 lb (83.5 kg)   SpO2 98%   BMI 33.65 kg/m , BMI Body mass index is 33.65 kg/m.  Wt Readings from Last 3 Encounters:  11/15/18 184 lb (83.5 kg)  10/27/18 185 lb 3.2 oz (84 kg)  05/09/18 156 lb (70.8 kg)    General: Elderly woman in  wheelchair. HEENT: Conjunctiva and lids normal, wearing a mask. Neck: Supple, no elevated JVP or carotid bruits, no thyromegaly. Lungs: Decreased breath sounds at the bases. Cardiac: Regular rate and rhythm, no gallop. Abdomen: Soft, nontender, bowel sounds present. Extremities: 2+ leg edema, distal pulses 2+. Skin: Warm and dry. Musculoskeletal: No kyphosis. Neuropsychiatric: Alert and oriented x3, affect grossly appropriate.  ECG:  An ECG dated 10/27/2018 was personally reviewed today and demonstrated:  Sinus rhythm with prolonged PR interval and poor R wave progression.  Recent Labwork: No results found for requested labs within last 8760 hours.     Component Value Date/Time   CHOL 128 10/22/2016 0548   TRIG 85 10/22/2016 0548   HDL 39 (L) 10/22/2016 0548    CHOLHDL 3.3 10/22/2016 0548   VLDL 17 10/22/2016 0548   LDLCALC 72 10/22/2016 0548    Other Studies Reviewed Today:  Echocardiogram 09/13/2018: 1. The left ventricle has normal systolic function with an ejection fraction of 60-65%. The cavity size was normal. Left ventricular diastolic Doppler parameters are consistent with pseudonormalization. Elevated mean left atrial pressure. 2. The right ventricle has normal systolic function. The cavity was normal. There is no increase in right ventricular wall thickness. 3. Left atrial size was severely dilated. 4. Right atrial size was mildly dilated. 5. The aortic valve is tricuspid. Moderate thickening of the aortic valve. Moderate calcification of the aortic valve. Aortic valve regurgitation is moderate by color flow Doppler. No stenosis of the aortic valve. Moderate aortic annular calcification  noted. 6. The mitral valve is abnormal. Mild thickening of the mitral valve leaflet. Mild calcification of the mitral valve leaflet. There is mild to moderate mitral annular calcification present. Mitral valve regurgitation is moderate by color flow Doppler. 7. The aortic root is normal in size and structure. 8. Pulmonary hypertension is indeterminant, inadequate TR jet.  Assessment and Plan:  1.  Chronic diastolic heart failure with continued evidence of fluid overload, although she reports feeling better since Lasix was increased.  We will add metolazone 2.5 mg on Monday and Thursday each week and continue with present potassium supplementation.  Follow-up BMET in 2 weeks.  2.  Paroxysmal atrial fibrillation.  She is maintaining sinus rhythm on amiodarone and continues on Coumadin for stroke prophylaxis.  3.  Essential hypertension, blood pressure is elevated today.  She continues on Cozaar.  Medication Adjustments/Labs and Tests Ordered: Current medicines are reviewed at length with the patient today.  Concerns regarding medicines are outlined  above.   Tests Ordered: Orders Placed This Encounter  Procedures  . Basic metabolic panel    Medication Changes: Meds ordered this encounter  Medications  . metolazone (ZAROXOLYN) 2.5 MG tablet    Sig: Take 1 tablet (2.5 mg total) by mouth 2 (two) times a week. On Monday & Thursday    Dispense:  8 tablet    Refill:  3    Disposition:  Follow up 6 to 8 weeks in the [place] office.  Signed, Satira Sark, MD, [facility] 11/15/2018 3:08 PM    [place] [facility] [place], [place], [place] [address] Phone: [phone]; Fax: [phone]

## 2018-11-24 DIAGNOSIS — I4891 Unspecified atrial fibrillation: Secondary | ICD-10-CM | POA: Diagnosis not present

## 2018-11-24 DIAGNOSIS — I10 Essential (primary) hypertension: Secondary | ICD-10-CM | POA: Diagnosis not present

## 2018-11-24 DIAGNOSIS — Z6827 Body mass index (BMI) 27.0-27.9, adult: Secondary | ICD-10-CM | POA: Diagnosis not present

## 2018-11-24 DIAGNOSIS — Z299 Encounter for prophylactic measures, unspecified: Secondary | ICD-10-CM | POA: Diagnosis not present

## 2018-11-24 DIAGNOSIS — Z713 Dietary counseling and surveillance: Secondary | ICD-10-CM | POA: Diagnosis not present

## 2018-11-28 ENCOUNTER — Telehealth: Payer: Self-pay | Admitting: Cardiology

## 2018-11-28 MED ORDER — FUROSEMIDE 40 MG PO TABS: 40.0000 mg | ORAL_TABLET | Freq: Every day | ORAL | 1 refills | Status: DC

## 2018-11-28 NOTE — Telephone Encounter (Signed)
Would like to have lasix increased

## 2018-11-28 NOTE — Telephone Encounter (Signed)
Pt wanted lasix 40 mg tablet sent to [facility] - Medication sent to pharmacy.

## 2018-11-29 DIAGNOSIS — I10 Essential (primary) hypertension: Secondary | ICD-10-CM | POA: Diagnosis not present

## 2018-11-29 DIAGNOSIS — I5032 Chronic diastolic (congestive) heart failure: Secondary | ICD-10-CM | POA: Diagnosis not present

## 2018-12-01 ENCOUNTER — Telehealth: Payer: Self-pay | Admitting: *Deleted

## 2018-12-01 NOTE — Telephone Encounter (Signed)
-----   Message from Satira Sark, MD sent at 11/30/2018  7:54 PM EDT ----- Results reviewed. Renal function stable but potassium low at 3.3.  KCL listed in chart as 10-20 mEq daily. If she is taking 10 mEq daily, then increase to 20 mEq daily. If she is taking 20 mEq daily, then increase to 40 mEq daily.

## 2018-12-01 NOTE — Telephone Encounter (Signed)
Pt confirmed she was taking potassium 10 meq daily - will increase to 20 meq - updated medication list - has f/u 10/21

## 2018-12-06 ENCOUNTER — Other Ambulatory Visit: Payer: Self-pay | Admitting: *Deleted

## 2018-12-06 MED ORDER — POTASSIUM CHLORIDE CRYS ER 20 MEQ PO TBCR: 20.0000 meq | EXTENDED_RELEASE_TABLET | Freq: Every day | ORAL | 6 refills | Status: DC

## 2018-12-08 DIAGNOSIS — I4891 Unspecified atrial fibrillation: Secondary | ICD-10-CM | POA: Diagnosis not present

## 2018-12-08 DIAGNOSIS — Z6827 Body mass index (BMI) 27.0-27.9, adult: Secondary | ICD-10-CM | POA: Diagnosis not present

## 2018-12-08 DIAGNOSIS — Z299 Encounter for prophylactic measures, unspecified: Secondary | ICD-10-CM | POA: Diagnosis not present

## 2018-12-08 DIAGNOSIS — I10 Essential (primary) hypertension: Secondary | ICD-10-CM | POA: Diagnosis not present

## 2018-12-20 ENCOUNTER — Encounter: Payer: Self-pay | Admitting: Cardiology

## 2018-12-20 ENCOUNTER — Other Ambulatory Visit: Payer: Self-pay

## 2018-12-20 ENCOUNTER — Ambulatory Visit (INDEPENDENT_AMBULATORY_CARE_PROVIDER_SITE_OTHER): Payer: Medicare Other | Admitting: Cardiology

## 2018-12-20 VITALS — BP 90/51 | HR 54 | Ht 62.0 in | Wt 182.0 lb

## 2018-12-20 DIAGNOSIS — I5032 Chronic diastolic (congestive) heart failure: Secondary | ICD-10-CM

## 2018-12-20 DIAGNOSIS — I480 Paroxysmal atrial fibrillation: Secondary | ICD-10-CM | POA: Diagnosis not present

## 2018-12-20 MED ORDER — LOSARTAN POTASSIUM 25 MG PO TABS: 12.5000 mg | ORAL_TABLET | Freq: Every day | ORAL | 2 refills | Status: DC

## 2018-12-20 NOTE — Progress Notes (Signed)
Cardiology Office Note  Date: 12/20/2018   ID: Danielle Brooks, DOB 05-15-1928, MRN [id number]  PCP:  Ignatius Specking, MD  Cardiologist:  Nona Dell, MD Electrophysiologist:  None   Chief Complaint  Patient presents with  . Cardiac follow-up    History of Present Illness: Danielle Brooks is an 85 y.o. female last seen in September.  She presents for a follow-up visit.  Her weight has come down a few more pounds since we added twice weekly Zaroxolyn.  Follow-up lab work also showed stable renal function.  We did increase her potassium supplements.  Blood pressure is lower today and we discussed reducing her Cozaar to 12.5 mg daily.  She continues on Coumadin with follow-up by Dr. Sherril Croon.  She does not report any palpitations and has had good rhythm control overall on amiodarone.  Past Medical History:  Diagnosis Date  . Atrial fibrillation (HCC)    Paroxysmal, history of normal LV and negative ischemic workup  . Chronic diastolic heart failure (HCC)    EF 55-60% w/severe pulmonary HTN noted on echo 10/20/16   . Mixed hyperlipidemia   . Osteoporosis   . TIA (transient ischemic attack)   . Vitamin D deficiency     Past Surgical History:  Procedure Laterality Date  . DILATION AND CURETTAGE OF UTERUS  1999    Current Outpatient Medications  Medication Sig Dispense Refill  . amiodarone (PACERONE) 200 MG tablet Take 200 mg by mouth every morning.    . donepezil (ARICEPT) 5 MG tablet Take 5 mg by mouth daily.    . furosemide (LASIX) 40 MG tablet Take 1 tablet (40 mg total) by mouth daily. 90 tablet 1  . losartan (COZAAR) 25 MG tablet Take 0.5 tablets (12.5 mg total) by mouth daily. 45 tablet 2  . metolazone (ZAROXOLYN) 2.5 MG tablet Take 1 tablet (2.5 mg total) by mouth 2 (two) times a week. On Monday & Thursday 8 tablet 3  . potassium chloride SA (KLOR-CON) 20 MEQ tablet Take 1 tablet (20 mEq total) by mouth daily. 30 tablet 6  . traZODone (DESYREL) 100 MG tablet Take 100 mg by  mouth at bedtime as needed.     . Vitamin D, Ergocalciferol, (DRISDOL) 50000 UNITS CAPS Take 50,000 Units by mouth every Tuesday.     . warfarin (COUMADIN) 5 MG tablet Take 2.5-5 mg by mouth See admin instructions. Take 1 tablet (5mg ) all days except on Wednesday take 1/2 tablet (2.5mg ) Managed by Saturday office     No current facility-administered medications for this visit.    Allergies:  Bactrim [sulfamethoxazole-trimethoprim], Codeine, and Novocain [procaine hcl]   Social History: The patient  reports that she has never smoked. She has never used smokeless tobacco. She reports that she does not drink alcohol or use drugs.   ROS:  Please see the history of present illness. Otherwise, complete review of systems is positive for hearing loss.  All other systems are reviewed and negative.   Physical Exam: VS:  BP (!) 90/51   Pulse (!) 54   Ht 5\' 2"  (1.575 m)   Wt 182 lb (82.6 kg)   SpO2 93%   BMI 33.29 kg/m , BMI Body mass index is 33.29 kg/m.  Wt Readings from Last 3 Encounters:  12/20/18 182 lb (82.6 kg)  11/15/18 184 lb (83.5 kg)  10/27/18 185 lb 3.2 oz (84 kg)    General: Elderly woman, seated in wheelchair. HEENT: Conjunctiva and lids normal, wearing a  mask. Neck: Supple, no elevated JVP or carotid bruits, no thyromegaly. Lungs: Decreased breath sounds, nonlabored breathing at rest. Cardiac: Regular rate and rhythm, no gallop. Abdomen: Soft, nontender, no hepatomegaly, bowel sounds present, no guarding or rebound. Extremities: Improving leg edema, distal pulses 2+. Skin: Warm and dry. Musculoskeletal: No kyphosis. Neuropsychiatric: Alert and oriented x3, affect grossly appropriate.  ECG:  An ECG dated 10/27/2018 was personally reviewed today and demonstrated:  Sinus rhythm with prolonged PR interval and poor R wave progression.  Recent Labwork:  September 2020: BUN 28, creatinine 1.0, potassium 3.3  Other Studies Reviewed Today:  Echocardiogram 09/13/2018: 1. The left  ventricle has normal systolic function with an ejection fraction of 60-65%. The cavity size was normal. Left ventricular diastolic Doppler parameters are consistent with pseudonormalization. Elevated mean left atrial pressure. 2. The right ventricle has normal systolic function. The cavity was normal. There is no increase in right ventricular wall thickness. 3. Left atrial size was severely dilated. 4. Right atrial size was mildly dilated. 5. The aortic valve is tricuspid. Moderate thickening of the aortic valve. Moderate calcification of the aortic valve. Aortic valve regurgitation is moderate by color flow Doppler. No stenosis of the aortic valve. Moderate aortic annular calcification  noted. 6. The mitral valve is abnormal. Mild thickening of the mitral valve leaflet. Mild calcification of the mitral valve leaflet. There is mild to moderate mitral annular calcification present. Mitral valve regurgitation is moderate by color flow Doppler. 7. The aortic root is normal in size and structure. 8. Pulmonary hypertension is indeterminant, inadequate TR jet.  Assessment and Plan:  1.  Chronic diastolic heart failure.  Fluid status has gradually improved, her weight is down a few more pounds.  Plan to continue Lasix with twice weekly metolazone, reduce losartan to 12.5 mg daily in light of lower blood pressures, check BMET for next follow-up visit.  2.  Paroxysmal atrial fibrillation.  She has done well without recurring palpitations and has had good rhythm control on amiodarone.  Continue Coumadin for stroke prophylaxis.   Medication Adjustments/Labs and Tests Ordered: Current medicines are reviewed at length with the patient today.  Concerns regarding medicines are outlined above.   Tests Ordered: Orders Placed This Encounter  Procedures  . Basic metabolic panel    Medication Changes: Meds ordered this encounter  Medications  . losartan (COZAAR) 25 MG tablet    Sig: Take 0.5 tablets  (12.5 mg total) by mouth daily.    Dispense:  45 tablet    Refill:  2    12/20/2018 dose decrease-please break in half for patient    Disposition:  Follow up 3 months in the [place] office.  Signed, Satira Sark, MD, [facility] 12/20/2018 4:19 PM    [place] [facility] [place], [place], [place] [address] Phone: [phone]; Fax: [phone]

## 2018-12-20 NOTE — Patient Instructions (Addendum)
Medication Instructions:    Your physician has recommended you make the following change in your medication:   Decrease losartan to 12.5 mg by mouth daily. Please break your 25 mg tablet in half daily  Continue all other medications the same  Labwork:  Your physician recommends that you return for lab work in: 3 months just before your next visit to check your BMET. Please take the lab order given to you today to the lab when you have this done. You may have this done at [facility] or [facility].   Testing/Procedures:  NONE  Follow-Up:  Your physician recommends that you schedule a follow-up appointment in: 3 months.   Any Other Special Instructions Will Be Listed Below (If Applicable).  If you need a refill on your cardiac medications before your next appointment, please call your pharmacy.

## 2019-01-05 DIAGNOSIS — Z6833 Body mass index (BMI) 33.0-33.9, adult: Secondary | ICD-10-CM | POA: Diagnosis not present

## 2019-01-05 DIAGNOSIS — I4891 Unspecified atrial fibrillation: Secondary | ICD-10-CM | POA: Diagnosis not present

## 2019-01-05 DIAGNOSIS — Z299 Encounter for prophylactic measures, unspecified: Secondary | ICD-10-CM | POA: Diagnosis not present

## 2019-01-05 DIAGNOSIS — I10 Essential (primary) hypertension: Secondary | ICD-10-CM | POA: Diagnosis not present

## 2019-01-05 DIAGNOSIS — F0390 Unspecified dementia without behavioral disturbance: Secondary | ICD-10-CM | POA: Diagnosis not present

## 2019-01-05 DIAGNOSIS — D692 Other nonthrombocytopenic purpura: Secondary | ICD-10-CM | POA: Diagnosis not present

## 2019-02-05 DIAGNOSIS — I10 Essential (primary) hypertension: Secondary | ICD-10-CM | POA: Diagnosis not present

## 2019-02-05 DIAGNOSIS — Z299 Encounter for prophylactic measures, unspecified: Secondary | ICD-10-CM | POA: Diagnosis not present

## 2019-02-05 DIAGNOSIS — I4891 Unspecified atrial fibrillation: Secondary | ICD-10-CM | POA: Diagnosis not present

## 2019-02-05 DIAGNOSIS — Z6827 Body mass index (BMI) 27.0-27.9, adult: Secondary | ICD-10-CM | POA: Diagnosis not present

## 2019-02-05 DIAGNOSIS — I5032 Chronic diastolic (congestive) heart failure: Secondary | ICD-10-CM | POA: Diagnosis not present

## 2019-02-05 DIAGNOSIS — F419 Anxiety disorder, unspecified: Secondary | ICD-10-CM | POA: Diagnosis not present

## 2019-02-06 IMAGING — CT CT HEAD W/O CM
3 series · 15 of 47 positions shown, 18 images · non-contrast
Comparison: CT HEAD July 16, 2016

CLINICAL DATA: Shortness of breath, altered level consciousness.
Decompensated CHF, atrial fibrillation on Coumadin.

EXAM:
CT HEAD WITHOUT CONTRAST
TECHNIQUE: Contiguous axial images were obtained from the base of the skull
through the vertex without intravenous contrast.

[Series 2: head wo · axial · 0.47mm/px · z∈[+131,+256]mm · 9 of 30 slices shown, 12 images]
[im 3/30  brain]
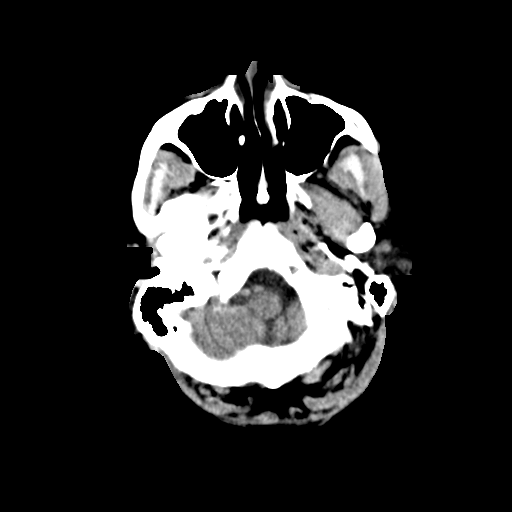
[im 3/30  bone]
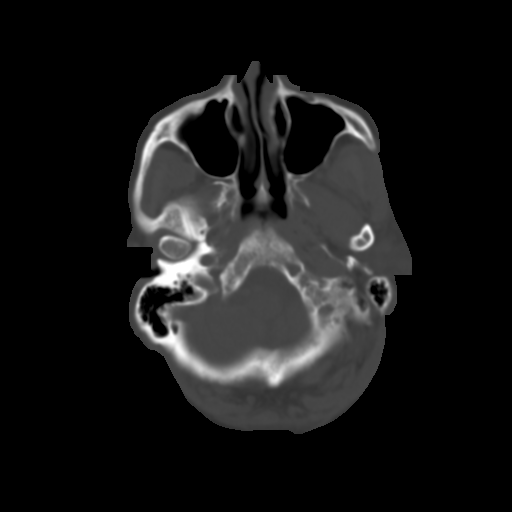
[im 6/30  brain]
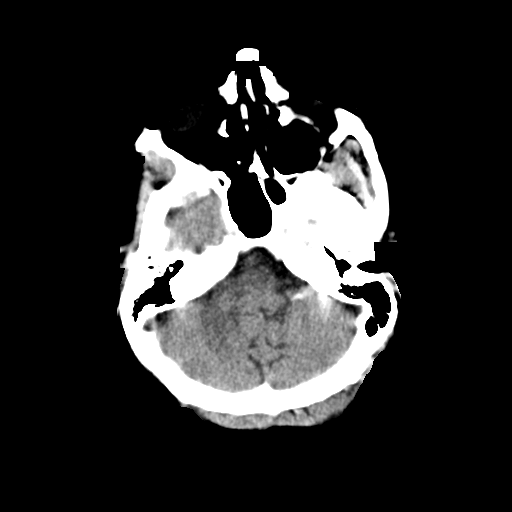
[im 9/30  brain]
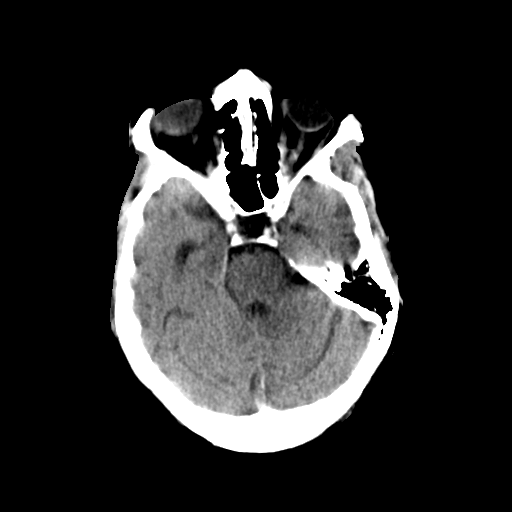
[im 12/30  brain]
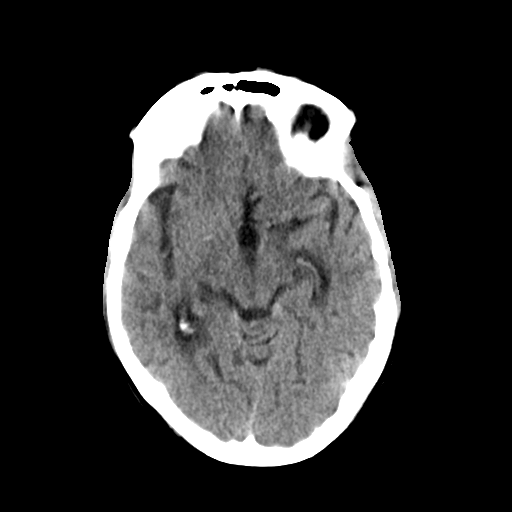
[im 16/30  brain]
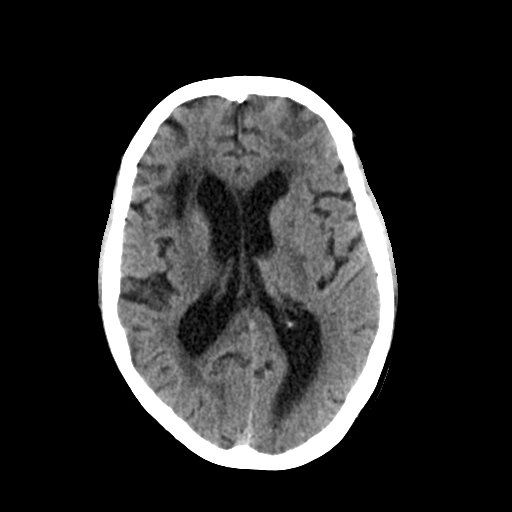
[im 16/30  bone]
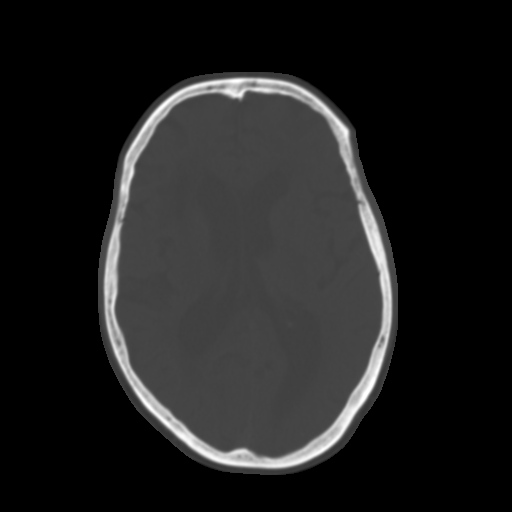
[im 19/30  brain]
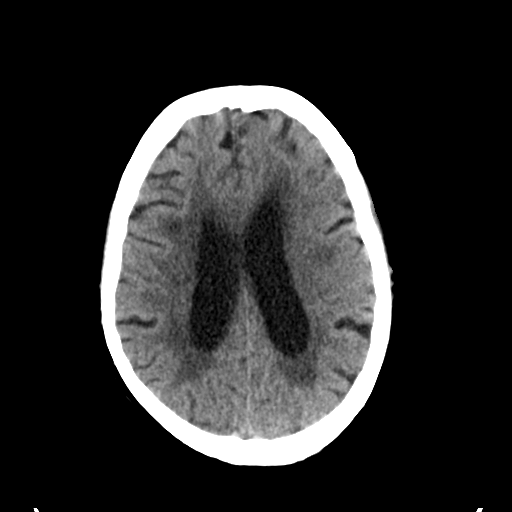
[im 22/30  brain]
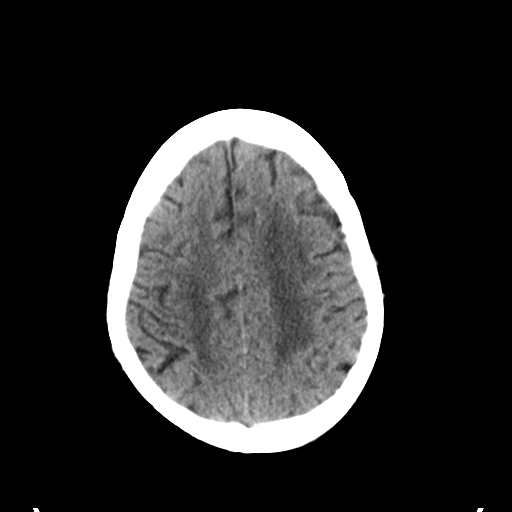
[im 25/30  brain]
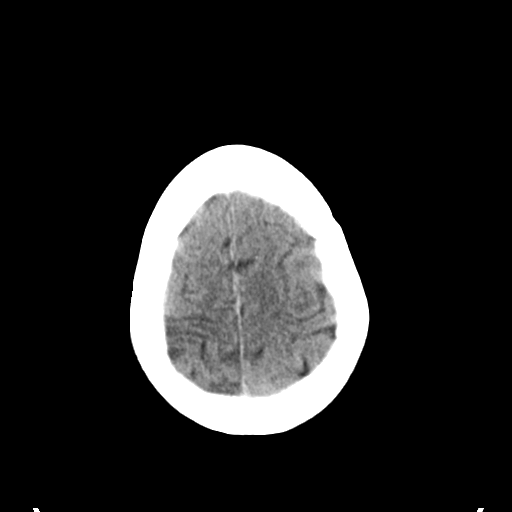
[im 28/30  brain]
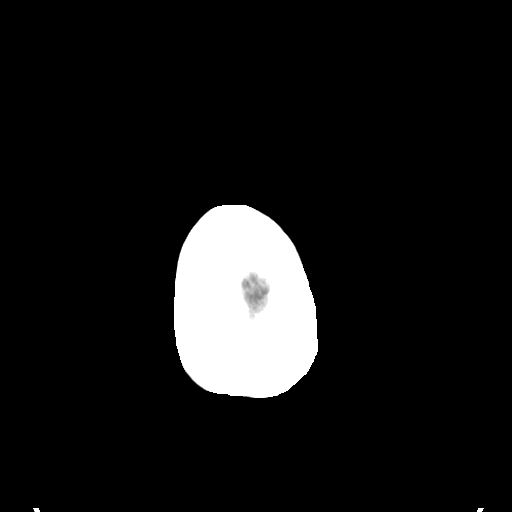
[im 28/30  bone]
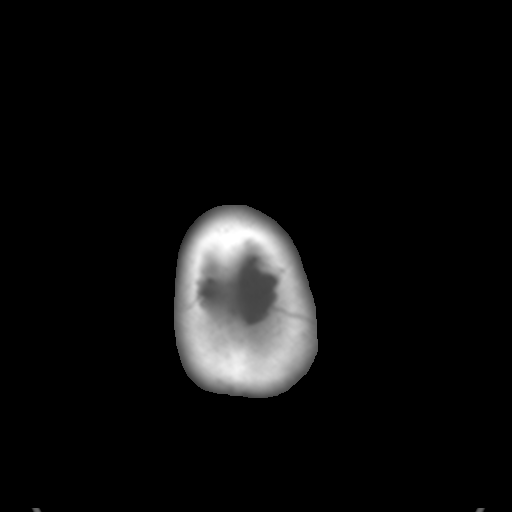

[Series 4: coronal soft tissue · coronal · 0.29mm/px · 3 of 66 slices shown]
[im 22/66  brain]
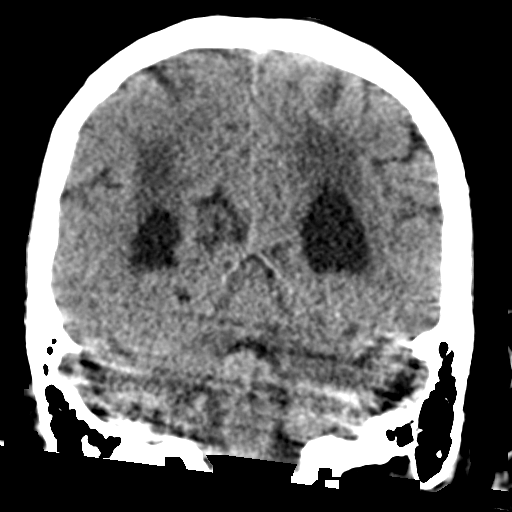
[im 29/66  brain]
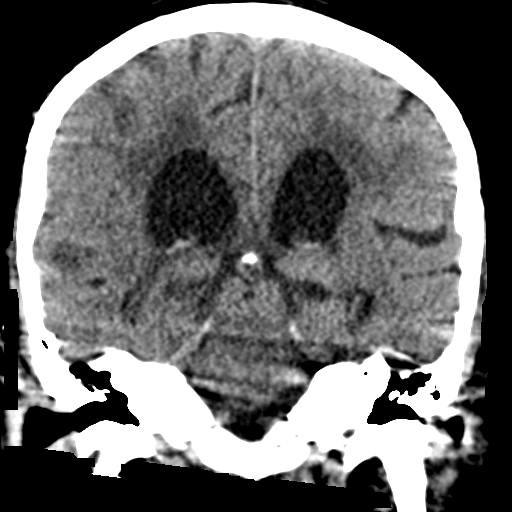
[im 37/66  brain]
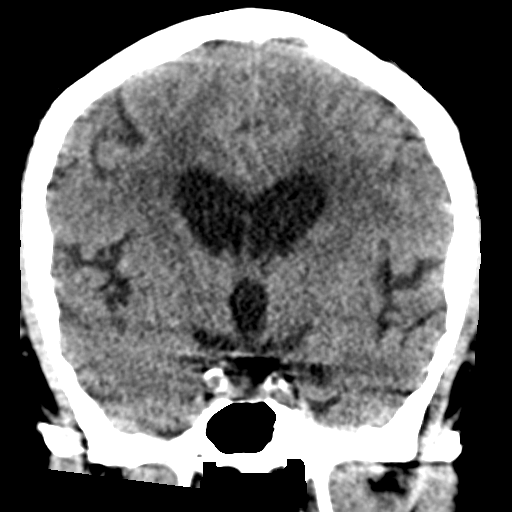

[Series 5: sagittal soft tissue · sagittal · 0.32mm/px · 3 of 52 slices shown]
[im 18/52  brain]
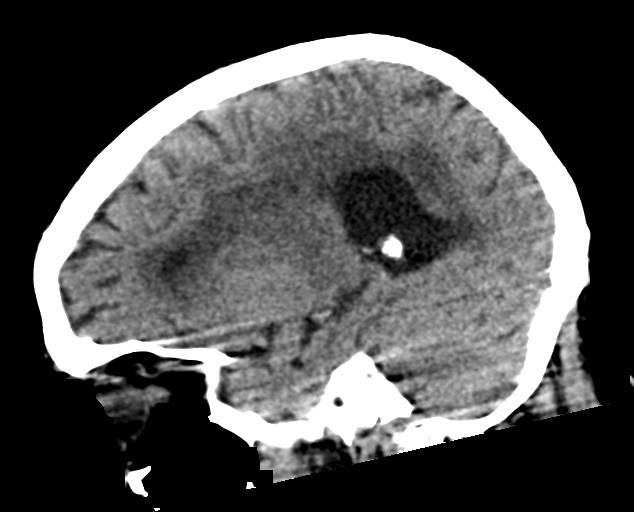
[im 26/52  brain]
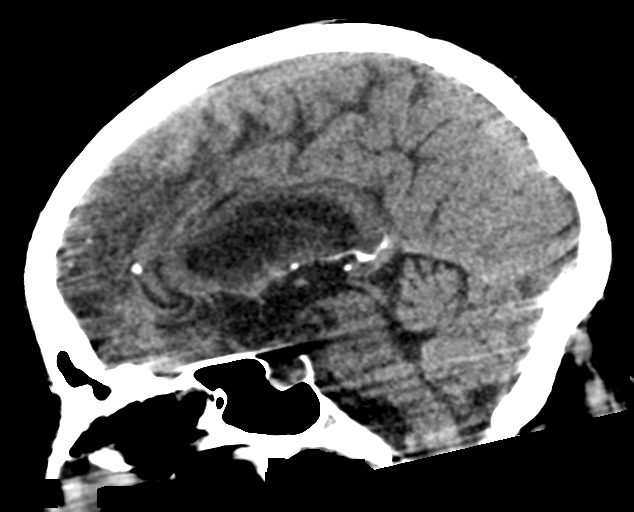
[im 34/52  brain]
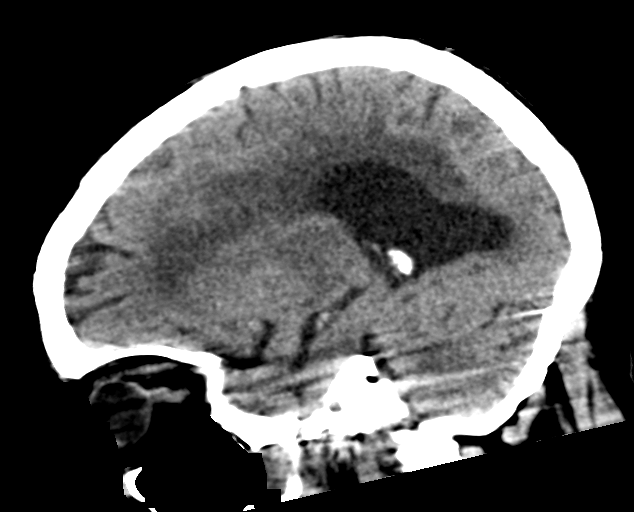

[15 of 47 positions shown; findings below may reference images not displayed]

FINDINGS: Moderately motion degraded examination.

BRAIN: No intraparenchymal hemorrhage, mass effect nor midline
shift. Moderate ventriculomegaly with mild sulcal effacement at the
convexities. Confluent supratentorial white matter hypodensities,
worse within RIGHT juxta cortical 2D white matter. Focal blurring of
the RIGHT frontal gray-white matter. No abnormal extra-axial fluid
collections. Basal cisterns are patent.

VASCULAR: Mild calcific atherosclerosis of the carotid siphons.

SKULL: No skull fracture. No significant scalp soft tissue swelling.

SINUSES/ORBITS: The mastoid air-cells and included paranasal sinuses
are well-aerated.The included ocular globes and orbital contents are
non-suspicious. Brass post bilateral ocular lens implants.

OTHER: None.
IMPRESSION: 1. Moderately motion degraded examination.
2. Acute to subacute small RIGHT frontal lobe/ MCA territories
suspected infarct.
3. Worsening moderate to severe chronic small vessel ischemic
disease.
4. Chronic mild suspected normal pressure hydrocephalus.

## 2019-02-13 ENCOUNTER — Other Ambulatory Visit: Payer: Self-pay | Admitting: Cardiology

## 2019-02-26 ENCOUNTER — Other Ambulatory Visit: Payer: Self-pay | Admitting: Cardiology

## 2019-03-08 DIAGNOSIS — I10 Essential (primary) hypertension: Secondary | ICD-10-CM | POA: Diagnosis not present

## 2019-03-08 DIAGNOSIS — I4891 Unspecified atrial fibrillation: Secondary | ICD-10-CM | POA: Diagnosis not present

## 2019-03-08 DIAGNOSIS — Z6827 Body mass index (BMI) 27.0-27.9, adult: Secondary | ICD-10-CM | POA: Diagnosis not present

## 2019-03-08 DIAGNOSIS — Z299 Encounter for prophylactic measures, unspecified: Secondary | ICD-10-CM | POA: Diagnosis not present

## 2019-03-08 DIAGNOSIS — D692 Other nonthrombocytopenic purpura: Secondary | ICD-10-CM | POA: Diagnosis not present

## 2019-03-13 ENCOUNTER — Other Ambulatory Visit: Payer: Self-pay | Admitting: Internal Medicine

## 2019-03-14 NOTE — Telephone Encounter (Signed)
This is Danielle Brooks pt. °

## 2019-03-19 DIAGNOSIS — I509 Heart failure, unspecified: Secondary | ICD-10-CM | POA: Diagnosis not present

## 2019-03-19 DIAGNOSIS — I4891 Unspecified atrial fibrillation: Secondary | ICD-10-CM | POA: Diagnosis not present

## 2019-03-19 DIAGNOSIS — I10 Essential (primary) hypertension: Secondary | ICD-10-CM | POA: Diagnosis not present

## 2019-03-19 DIAGNOSIS — E7800 Pure hypercholesterolemia, unspecified: Secondary | ICD-10-CM | POA: Diagnosis not present

## 2019-03-27 ENCOUNTER — Telehealth: Payer: Self-pay | Admitting: *Deleted

## 2019-03-27 DIAGNOSIS — I5032 Chronic diastolic (congestive) heart failure: Secondary | ICD-10-CM | POA: Diagnosis not present

## 2019-03-27 MED ORDER — POTASSIUM CHLORIDE CRYS ER 20 MEQ PO TBCR: 20.0000 meq | EXTENDED_RELEASE_TABLET | Freq: Every day | ORAL | 6 refills | Status: DC

## 2019-03-27 NOTE — Telephone Encounter (Signed)
Patient informed and copy sent to PCP Reports weight stable @ 182 lbs Reports swelling in left leg and none in right leg SOB is stable OV 04/02/19 @3 :00 pm

## 2019-03-27 NOTE — Telephone Encounter (Signed)
-----   Message from Jonelle Sidle, MD sent at 03/27/2019  3:24 PM EST ----- Results reviewed.  Creatinine is bumped up to 1.41, but if her fluid status has been steady, would continue current diuretic dosing.  Increase KCl to 20 mEq alternating with 40 mEq every other day.

## 2019-04-02 ENCOUNTER — Ambulatory Visit (INDEPENDENT_AMBULATORY_CARE_PROVIDER_SITE_OTHER): Payer: Medicare Other | Admitting: Cardiology

## 2019-04-02 ENCOUNTER — Encounter: Payer: Self-pay | Admitting: Cardiology

## 2019-04-02 ENCOUNTER — Other Ambulatory Visit: Payer: Self-pay

## 2019-04-02 VITALS — BP 138/60 | HR 65 | Ht 62.0 in | Wt 194.0 lb

## 2019-04-02 DIAGNOSIS — I480 Paroxysmal atrial fibrillation: Secondary | ICD-10-CM | POA: Diagnosis not present

## 2019-04-02 DIAGNOSIS — I5032 Chronic diastolic (congestive) heart failure: Secondary | ICD-10-CM

## 2019-04-02 DIAGNOSIS — Z79899 Other long term (current) drug therapy: Secondary | ICD-10-CM | POA: Diagnosis not present

## 2019-04-02 MED ORDER — POTASSIUM CHLORIDE CRYS ER 20 MEQ PO TBCR: EXTENDED_RELEASE_TABLET | ORAL | 6 refills | Status: DC

## 2019-04-02 NOTE — Patient Instructions (Addendum)
Medication Instructions:   Potassium refilled today.   Continue all other medications.    Labwork:  TSH, CMET - orders given today.   Due just prior to next visit.  Testing/Procedures: none  Follow-Up: 3 months   Any Other Special Instructions Will Be Listed Below (If Applicable).  If you need a refill on your cardiac medications before your next appointment, please call your pharmacy.

## 2019-04-02 NOTE — Progress Notes (Signed)
Cardiology Office Note  Date: 04/02/2019   ID: Danielle Brooks, DOB November 06, 1928, MRN [id number]  PCP:  Ignatius Specking, MD  Cardiologist:  Nona Dell, MD Electrophysiologist:  None   Chief Complaint  Patient presents with  . Cardiac follow-up    History of Present Illness: Danielle Brooks is an 86 y.o. female last seen in October 2020.  She presents for a routine visit.  She states that her leg swelling has been adequately controlled on current dose of Lasix.  She reports chronic dyspnea on exertion, no chest pain or palpitations.  She is in a wheelchair today.  Recent lab work from January is outlined below.  Potassium supplements were increased.  She remains on Coumadin with follow-up by Dr. Sherril Croon.  She does not report any bleeding episodes.  We went over her current cardiac medications which include Coumadin, Lasix, Zaroxolyn, potassium supplements, losartan, and low-dose amiodarone.  Past Medical History:  Diagnosis Date  . Atrial fibrillation (HCC)    Paroxysmal, history of normal LV and negative ischemic workup  . Chronic diastolic heart failure (HCC)    EF 55-60% w/severe pulmonary HTN noted on echo 10/20/16   . Mixed hyperlipidemia   . Osteoporosis   . TIA (transient ischemic attack)   . Vitamin D deficiency     Past Surgical History:  Procedure Laterality Date  . DILATION AND CURETTAGE OF UTERUS  1999    Current Outpatient Medications  Medication Sig Dispense Refill  . amiodarone (PACERONE) 200 MG tablet Take 200 mg by mouth daily.    Marland Kitchen donepezil (ARICEPT) 5 MG tablet Take 5 mg by mouth daily.    . furosemide (LASIX) 40 MG tablet TAKE ONE TABLET BY MOUTH DAILY 90 tablet 3  . losartan (COZAAR) 25 MG tablet Take 0.5 tablets (12.5 mg total) by mouth daily. 45 tablet 2  . metolazone (ZAROXOLYN) 2.5 MG tablet TAKE ONE TABLET BY MOUTH TWICE A WEEK ON MONDAY & THURSDAY 8 tablet 3  . potassium chloride SA (KLOR-CON) 20 MEQ tablet Alternate 20 meq with 40 meq every other day  45 tablet 6  . traZODone (DESYREL) 100 MG tablet Take 100 mg by mouth at bedtime as needed.     . Vitamin D, Ergocalciferol, (DRISDOL) 50000 UNITS CAPS Take 50,000 Units by mouth every Tuesday.     . warfarin (COUMADIN) 5 MG tablet Take 2.5-5 mg by mouth See admin instructions. Take 1 tablet (5mg ) all days except on Wednesday take 1/2 tablet (2.5mg ) Managed by Tuesday office     No current facility-administered medications for this visit.   Allergies:  Bactrim [sulfamethoxazole-trimethoprim], Codeine, and Novocain [procaine hcl]   Social History: The patient  reports that she has never smoked. She has never used smokeless tobacco. She reports that she does not drink alcohol or use drugs.   ROS:  Please see the history of present illness. Otherwise, complete review of systems is positive for chronic hearing loss.  All other systems are reviewed and negative.   Physical Exam: VS:  BP 138/60   Pulse 65   Ht 5\' 2"  (1.575 m)   Wt 194 lb (88 kg)   SpO2 91%   BMI 35.48 kg/m , BMI Body mass index is 35.48 kg/m.  Wt Readings from Last 3 Encounters:  04/02/19 194 lb (88 kg)  12/20/18 182 lb (82.6 kg)  11/15/18 184 lb (83.5 kg)    General: Elderly woman in wheelchair. HEENT: Conjunctiva and lids normal, wearing a mask.  Neck: Supple, no elevated JVP or carotid bruits, no thyromegaly. Lungs: Decreased breath sounds without crackles, nonlabored breathing at rest. Cardiac: Regular rate and rhythm, no S3 or significant systolic murmur. Abdomen: Soft, nontender, bowel sounds present. Extremities: Trace lower leg edema, distal pulses 2+. Skin: Warm and dry. Musculoskeletal: No kyphosis. Neuropsychiatric: Alert and oriented x3, affect grossly appropriate.  ECG:  An ECG dated 10/27/2018 was personally reviewed today and demonstrated:  Sinus rhythm with prolonged PR interval and poor R wave progression.  Recent Labwork:  January 2021: BUN 30, creatinine 1.41, potassium 3.2  Other Studies Reviewed  Today:  Echocardiogram 09/13/2018: 1. The left ventricle has normal systolic function with an ejection fraction of 60-65%. The cavity size was normal. Left ventricular diastolic Doppler parameters are consistent with pseudonormalization. Elevated mean left atrial pressure. 2. The right ventricle has normal systolic function. The cavity was normal. There is no increase in right ventricular wall thickness. 3. Left atrial size was severely dilated. 4. Right atrial size was mildly dilated. 5. The aortic valve is tricuspid. Moderate thickening of the aortic valve. Moderate calcification of the aortic valve. Aortic valve regurgitation is moderate by color flow Doppler. No stenosis of the aortic valve. Moderate aortic annular calcification  noted. 6. The mitral valve is abnormal. Mild thickening of the mitral valve leaflet. Mild calcification of the mitral valve leaflet. There is mild to moderate mitral annular calcification present. Mitral valve regurgitation is moderate by color flow Doppler. 7. The aortic root is normal in size and structure. 8. Pulmonary hypertension is indeterminant, inadequate TR jet.  Assessment and Plan:  1.  Chronic diastolic heart failure.  Although her weight is up, she feels better clinically in terms of leg swelling.  Plan is to continue current regimen including Lasix with twice weekly metolazone, increase potassium supplements, and losartan.  Follow-up BMET for next visit.  2.  Paroxysmal atrial fibrillation.  She remains on low-dose amiodarone along with Coumadin.  Follow-up TSH and LFTs for next visit.  Medication Adjustments/Labs and Tests Ordered: Current medicines are reviewed at length with the patient today.  Concerns regarding medicines are outlined above.   Tests Ordered: Orders Placed This Encounter  Procedures  . TSH  . Comprehensive metabolic panel    Medication Changes: Meds ordered this encounter  Medications  . potassium chloride SA  (KLOR-CON) 20 MEQ tablet    Sig: Alternate 20 meq with 40 meq every other day    Dispense:  45 tablet    Refill:  6    03/27/2019 change in directions    Disposition:  Follow up 3 months in the [place] office.  Signed, Satira Sark, MD, [facility] 04/02/2019 3:30 PM    [place] at [place], [place], [place] [address] Phone: [phone]; Fax: [phone]

## 2019-04-09 DIAGNOSIS — Z299 Encounter for prophylactic measures, unspecified: Secondary | ICD-10-CM | POA: Diagnosis not present

## 2019-04-09 DIAGNOSIS — I4891 Unspecified atrial fibrillation: Secondary | ICD-10-CM | POA: Diagnosis not present

## 2019-04-09 DIAGNOSIS — E7800 Pure hypercholesterolemia, unspecified: Secondary | ICD-10-CM | POA: Diagnosis not present

## 2019-04-09 DIAGNOSIS — F419 Anxiety disorder, unspecified: Secondary | ICD-10-CM | POA: Diagnosis not present

## 2019-04-09 DIAGNOSIS — I10 Essential (primary) hypertension: Secondary | ICD-10-CM | POA: Diagnosis not present

## 2019-04-09 DIAGNOSIS — Z1331 Encounter for screening for depression: Secondary | ICD-10-CM | POA: Diagnosis not present

## 2019-04-09 DIAGNOSIS — Z1211 Encounter for screening for malignant neoplasm of colon: Secondary | ICD-10-CM | POA: Diagnosis not present

## 2019-04-09 DIAGNOSIS — I5032 Chronic diastolic (congestive) heart failure: Secondary | ICD-10-CM | POA: Diagnosis not present

## 2019-04-09 DIAGNOSIS — Z7189 Other specified counseling: Secondary | ICD-10-CM | POA: Diagnosis not present

## 2019-04-09 DIAGNOSIS — Z0000 Encounter for general adult medical examination without abnormal findings: Secondary | ICD-10-CM | POA: Diagnosis not present

## 2019-04-09 DIAGNOSIS — Z1339 Encounter for screening examination for other mental health and behavioral disorders: Secondary | ICD-10-CM | POA: Diagnosis not present

## 2019-04-09 DIAGNOSIS — Z6827 Body mass index (BMI) 27.0-27.9, adult: Secondary | ICD-10-CM | POA: Diagnosis not present

## 2019-05-02 DIAGNOSIS — Z23 Encounter for immunization: Secondary | ICD-10-CM | POA: Diagnosis not present

## 2019-05-07 DIAGNOSIS — I10 Essential (primary) hypertension: Secondary | ICD-10-CM | POA: Diagnosis not present

## 2019-05-07 DIAGNOSIS — Z299 Encounter for prophylactic measures, unspecified: Secondary | ICD-10-CM | POA: Diagnosis not present

## 2019-05-07 DIAGNOSIS — I4891 Unspecified atrial fibrillation: Secondary | ICD-10-CM | POA: Diagnosis not present

## 2019-05-30 DIAGNOSIS — Z23 Encounter for immunization: Secondary | ICD-10-CM | POA: Diagnosis not present

## 2019-06-04 DIAGNOSIS — I10 Essential (primary) hypertension: Secondary | ICD-10-CM | POA: Diagnosis not present

## 2019-06-04 DIAGNOSIS — F419 Anxiety disorder, unspecified: Secondary | ICD-10-CM | POA: Diagnosis not present

## 2019-06-04 DIAGNOSIS — I4891 Unspecified atrial fibrillation: Secondary | ICD-10-CM | POA: Diagnosis not present

## 2019-06-04 DIAGNOSIS — Z299 Encounter for prophylactic measures, unspecified: Secondary | ICD-10-CM | POA: Diagnosis not present

## 2019-06-04 DIAGNOSIS — I5032 Chronic diastolic (congestive) heart failure: Secondary | ICD-10-CM | POA: Diagnosis not present

## 2019-06-18 ENCOUNTER — Other Ambulatory Visit: Payer: Self-pay | Admitting: Cardiology

## 2019-07-02 DIAGNOSIS — I4891 Unspecified atrial fibrillation: Secondary | ICD-10-CM | POA: Diagnosis not present

## 2019-07-02 DIAGNOSIS — F419 Anxiety disorder, unspecified: Secondary | ICD-10-CM | POA: Diagnosis not present

## 2019-07-02 DIAGNOSIS — Z79899 Other long term (current) drug therapy: Secondary | ICD-10-CM | POA: Diagnosis not present

## 2019-07-02 DIAGNOSIS — I5032 Chronic diastolic (congestive) heart failure: Secondary | ICD-10-CM | POA: Diagnosis not present

## 2019-07-02 DIAGNOSIS — I5030 Unspecified diastolic (congestive) heart failure: Secondary | ICD-10-CM | POA: Diagnosis not present

## 2019-07-02 DIAGNOSIS — I10 Essential (primary) hypertension: Secondary | ICD-10-CM | POA: Diagnosis not present

## 2019-07-02 DIAGNOSIS — Z299 Encounter for prophylactic measures, unspecified: Secondary | ICD-10-CM | POA: Diagnosis not present

## 2019-07-09 ENCOUNTER — Other Ambulatory Visit: Payer: Self-pay

## 2019-07-09 ENCOUNTER — Ambulatory Visit (INDEPENDENT_AMBULATORY_CARE_PROVIDER_SITE_OTHER): Payer: Medicare Other | Admitting: Cardiology

## 2019-07-09 ENCOUNTER — Encounter: Payer: Self-pay | Admitting: Cardiology

## 2019-07-09 VITALS — BP 148/60 | HR 64 | Ht 62.0 in | Wt 208.2 lb

## 2019-07-09 DIAGNOSIS — I5032 Chronic diastolic (congestive) heart failure: Secondary | ICD-10-CM | POA: Diagnosis not present

## 2019-07-09 DIAGNOSIS — I480 Paroxysmal atrial fibrillation: Secondary | ICD-10-CM

## 2019-07-09 MED ORDER — FUROSEMIDE 40 MG PO TABS: ORAL_TABLET | ORAL | 3 refills | Status: DC

## 2019-07-09 NOTE — Patient Instructions (Addendum)
Medication Instructions:   Your physician has recommended you make the following change in your medication:   Increase furosemide to 60 mg alternating with 40 mg by mouth daily  Continue other medications the same  Labwork:  Your physician recommends that you return for non-fasting lab work in: 6 weeks just before your next visit to check your BMET.  Testing/Procedures:  NONE  Follow-Up:  Your physician recommends that you schedule a follow-up appointment in: 6 weeks (office).  Any Other Special Instructions Will Be Listed Below (If Applicable).  If you need a refill on your cardiac medications before your next appointment, please call your pharmacy.

## 2019-07-09 NOTE — Progress Notes (Signed)
Cardiology Office Note  Date: 07/09/2019   ID: Danielle Brooks, DOB 1928-05-08, MRN [id number]  PCP:  Ignatius Specking, MD  Cardiologist:  Nona Dell, MD Electrophysiologist:  None   Chief Complaint  Patient presents with  . Cardiac follow-up    History of Present Illness: Danielle Brooks is an 86 y.o. female last seen in February.  She presents today for a follow-up visit.  Today [birth year] her birthday - she plans to have dinner out with her husband.  Her weight has crept back up on stable diuretic regimen, we discussed making adjustments in her standing dose.  Recent follow-up lab work showed stable renal function and potassium.  She continues on Coumadin with follow-up by Dr. Sherril Croon.  She is also on amiodarone, recent TSH and LFTs were normal.  She has had no palpitations and her heart rate is regular today.   Past Medical History:  Diagnosis Date  . Atrial fibrillation (HCC)    Paroxysmal, history of normal LV and negative ischemic workup  . Chronic diastolic heart failure (HCC)    EF 55-60% w/severe pulmonary HTN noted on echo 10/20/16   . Mixed hyperlipidemia   . Osteoporosis   . TIA (transient ischemic attack)   . Vitamin D deficiency     Past Surgical History:  Procedure Laterality Date  . DILATION AND CURETTAGE OF UTERUS  1999    Current Outpatient Medications  Medication Sig Dispense Refill  . amiodarone (PACERONE) 200 MG tablet Take 200 mg by mouth daily.    Marland Kitchen donepezil (ARICEPT) 5 MG tablet Take 5 mg by mouth daily.    . furosemide (LASIX) 40 MG tablet Take 60 mg by mouth alternating with 40 mg daily. 135 tablet 3  . losartan (COZAAR) 25 MG tablet Take 0.5 tablets (12.5 mg total) by mouth daily. 45 tablet 2  . metolazone (ZAROXOLYN) 2.5 MG tablet Take 1 tablet (2.5 mg total) by mouth 2 (two) times a week. ON Monday AND THURSDAY 8 tablet 3  . potassium chloride SA (KLOR-CON) 20 MEQ tablet Alternate 20 meq with 40 meq every other day 45 tablet 6  . traZODone (DESYREL) 100  MG tablet Take 100 mg by mouth at bedtime as needed.     . Vitamin D, Ergocalciferol, (DRISDOL) 50000 UNITS CAPS Take 50,000 Units by mouth every Tuesday.     . warfarin (COUMADIN) 5 MG tablet Take 2.5-5 mg by mouth See admin instructions. Take 1 tablet (5mg ) all days except on Wednesday take 1/2 tablet (2.5mg ) Managed by Thursday office     No current facility-administered medications for this visit.   Allergies:  Bactrim [sulfamethoxazole-trimethoprim], Codeine, and Novocain [procaine hcl]   ROS:   In a wheelchair today, chronic dyspnea on exertion.  Physical Exam: VS:  BP (!) 148/60   Pulse 64   Ht 5\' 2"  (1.575 m)   Wt 208 lb 3.2 oz (94.4 kg)   SpO2 98%   BMI 38.08 kg/m , BMI Body mass index is 38.08 kg/m.  Wt Readings from Last 3 Encounters:  07/09/19 208 lb 3.2 oz (94.4 kg)  04/02/19 194 lb (88 kg)  12/20/18 182 lb (82.6 kg)    General:  Elderly woman in no distress. HEENT: Conjunctiva and lids normal, wearing a mask. Neck: Supple, no elevated JVP or carotid bruits, no thyromegaly. Lungs: Clear to auscultation, nonlabored breathing at rest. Cardiac: Regular rate and rhythm, no S3 or significant systolic murmur. Abdomen: Protuberant, bowel sounds present, no guarding or  rebound. Extremities: Chronic appearing leg edema/lymphedema, distal pulses 2+.  ECG:  An ECG dated 10/27/2018 was personally reviewed today and demonstrated:  Sinus rhythm with prolonged PR interval.  Recent Labwork:  May 2021: BUN 19, creatinine 0.92, potassium 4.2, AST 20, ALT 16, TSH 2.84  Other Studies Reviewed Today:  Echocardiogram 09/13/2018: 1. The left ventricle has normal systolic function with an ejection fraction of 60-65%. The cavity size was normal. Left ventricular diastolic Doppler parameters are consistent with pseudonormalization. Elevated mean left atrial pressure. 2. The right ventricle has normal systolic function. The cavity was normal. There is no increase in right ventricular wall  thickness. 3. Left atrial size was severely dilated. 4. Right atrial size was mildly dilated. 5. The aortic valve is tricuspid. Moderate thickening of the aortic valve. Moderate calcification of the aortic valve. Aortic valve regurgitation is moderate by color flow Doppler. No stenosis of the aortic valve. Moderate aortic annular calcification  noted. 6. The mitral valve is abnormal. Mild thickening of the mitral valve leaflet. Mild calcification of the mitral valve leaflet. There is mild to moderate mitral annular calcification present. Mitral valve regurgitation is moderate by color flow Doppler. 7. The aortic root is normal in size and structure. 8. Pulmonary hypertension is indeterminant, inadequate TR jet.  Assessment and Plan:  1.  Chronic diastolic heart failure, LVEF 60 to 65% by last assessment.  Weight has crept back up since February, she is up nearly 14 pounds.  Plan to increase Lasix to 40 mg alternating with 60 mg every other day, continue twice weekly metolazone.  Reinforced compliance with salt and fluid restriction.  Check BMET for next visit.  2.  Paroxysmal atrial fibrillation, doing well without obvious recurrences.  Heart rate is regular today.  She remains on low-dose amiodarone and Coumadin.  Recent TSH and LFTs normal.  Medication Adjustments/Labs and Tests Ordered: Current medicines are reviewed at length with the patient today.  Concerns regarding medicines are outlined above.   Tests Ordered: Orders Placed This Encounter  Procedures  . Basic metabolic panel    Medication Changes: Meds ordered this encounter  Medications  . furosemide (LASIX) 40 MG tablet    Sig: Take 60 mg by mouth alternating with 40 mg daily.    Dispense:  135 tablet    Refill:  3    07/09/2019 dose increase    Disposition:  Follow up 6 weeks in the [place] office.  Signed, Satira Sark, MD, [facility] 07/09/2019 4:01 PM    [place] [facility] [place], [place], [place] [address] Phone: [phone]; Fax: [phone]

## 2019-07-23 ENCOUNTER — Other Ambulatory Visit: Payer: Self-pay | Admitting: Cardiology

## 2019-07-29 DIAGNOSIS — E7800 Pure hypercholesterolemia, unspecified: Secondary | ICD-10-CM | POA: Diagnosis not present

## 2019-07-29 DIAGNOSIS — I4891 Unspecified atrial fibrillation: Secondary | ICD-10-CM | POA: Diagnosis not present

## 2019-07-29 DIAGNOSIS — I509 Heart failure, unspecified: Secondary | ICD-10-CM | POA: Diagnosis not present

## 2019-07-29 DIAGNOSIS — I10 Essential (primary) hypertension: Secondary | ICD-10-CM | POA: Diagnosis not present

## 2019-08-20 ENCOUNTER — Ambulatory Visit (INDEPENDENT_AMBULATORY_CARE_PROVIDER_SITE_OTHER): Payer: Medicare Other | Admitting: Family Medicine

## 2019-08-20 ENCOUNTER — Encounter: Payer: Self-pay | Admitting: Family Medicine

## 2019-08-20 ENCOUNTER — Other Ambulatory Visit: Payer: Self-pay

## 2019-08-20 VITALS — BP 115/68 | HR 80 | Ht 62.0 in | Wt 209.0 lb

## 2019-08-20 DIAGNOSIS — I5032 Chronic diastolic (congestive) heart failure: Secondary | ICD-10-CM

## 2019-08-20 DIAGNOSIS — I480 Paroxysmal atrial fibrillation: Secondary | ICD-10-CM

## 2019-08-20 NOTE — Progress Notes (Signed)
Cardiology Office Note  Date: 08/20/2019   ID: Danielle Brooks, DOB [birth year], MRN [id number]  PCP:  Glenda Chroman, MD  Cardiologist:  Rozann Lesches, MD Electrophysiologist:  None   Chief Complaint: F/U PAF, Chronic diastolic HF  History of Present Illness: Danielle Brooks is a 86 y.o. female with a history of  PAF, Chronic diastolic HF.  Last seen by Dr. Domenic Polite on 07/09/2019.  Her weight had crept up on stable diuretic regimen.  Dosage adjustment diuretic dose was discussed.  Her recent follow-up lab work showed stable renal function and potassium.  She continued Coumadin with follow-up arm PCP.  On amiodarone most recent normal thyroid and liver function tests.  She was experiencing no palpitations and heart rate was regular.  Plan was to increase Lasix to 40 mg alternating with 60 mg every other day.  And to continue twice weekly metolazone.  Reinforced compliance with salt and fluid restriction.  She remained on low-dose of amiodarone and Coumadin.  Here today for 1 month follow-up.  She has been alternating her Lasix 40 mg / 60 mg every other day.  Takes metolazone twice a week.  She denies any significant shortness of breath but does continue with bilateral lower extremity edema.  States her left leg has more edema than the right usually.  Weight is remaining relatively stable.  Her weight on 07/09/2019 was 208.  Today's weight is 209.  She states when she takes the 60 mg dose of Lasix she feels drained.  States she tolerates the 40 mg dose okay.  She is normotensive today on arrival with a blood pressure of 115/68.  She is sitting in a wheel chair accompanied by her daughter.    Past Medical History:  Diagnosis Date  . Atrial fibrillation (HCC)    Paroxysmal, history of normal LV and negative ischemic workup  . Chronic diastolic heart failure (HCC)    EF 55-60% w/severe pulmonary HTN noted on echo 10/20/16   . Mixed hyperlipidemia   . Osteoporosis   . TIA (transient ischemic attack)     . Vitamin D deficiency     Past Surgical History:  Procedure Laterality Date  . DILATION AND CURETTAGE OF UTERUS  1999    Current Outpatient Medications  Medication Sig Dispense Refill  . amiodarone (PACERONE) 200 MG tablet Take 200 mg by mouth daily.    Marland Kitchen donepezil (ARICEPT) 5 MG tablet Take 5 mg by mouth daily.    . furosemide (LASIX) 40 MG tablet Take 60 mg by mouth alternating with 40 mg daily. 135 tablet 3  . losartan (COZAAR) 25 MG tablet TAKE 1/2 TABLET BY MOUTH DAILY 45 tablet 2  . metolazone (ZAROXOLYN) 2.5 MG tablet Take 1 tablet (2.5 mg total) by mouth 2 (two) times a week. ON Monday AND THURSDAY 8 tablet 3  . potassium chloride SA (KLOR-CON) 20 MEQ tablet Alternate 20 meq with 40 meq every other day 45 tablet 6  . traZODone (DESYREL) 100 MG tablet Take 100 mg by mouth at bedtime as needed.     . Vitamin D, Ergocalciferol, (DRISDOL) 50000 UNITS CAPS Take 50,000 Units by mouth every Tuesday.     . warfarin (COUMADIN) 5 MG tablet Take 2.5-5 mg by mouth See admin instructions. Take 1 tablet (5mg ) all days except on Wednesday take 1/2 tablet (2.5mg ) Managed by [facility] office     No current facility-administered medications for this visit.   Allergies:  Bactrim [sulfamethoxazole-trimethoprim], Codeine, and Novocain [procaine  hcl]   Social History: The patient  reports that she has never smoked. She has never used smokeless tobacco. She reports that she does not drink alcohol and does not use drugs.   Family History: The patient's family history includes Alzheimer's disease in her mother; Coronary artery disease in her father; Hypertension in her sister.   ROS:  Please see the history of present illness. Otherwise, complete review of systems is positive for none.  All other systems are reviewed and negative.   Physical Exam: VS:  BP 115/68   Pulse 80   Ht 5\' 2"  (1.575 m)   Wt 209 lb (94.8 kg)   SpO2 96%   BMI 38.23 kg/m , BMI Body mass index is 38.23 kg/m.  Wt Readings  from Last 3 Encounters:  08/20/19 209 lb (94.8 kg)  07/09/19 208 lb 3.2 oz (94.4 kg)  04/02/19 194 lb (88 kg)    General: Patient appears comfortable at rest. Neck: Supple, no elevated JVP or carotid bruits, no thyromegaly. Lungs: Clear to auscultation, nonlabored breathing at rest. Cardiac: Regular rate and rhythm, no S3 or significant systolic murmur, no pericardial rub. Extremities: Bilateral 1+ pitting edema, distal pulses 2+. Skin: Warm and dry. Musculoskeletal: No kyphosis. Neuropsychiatric: Alert and oriented x3, affect grossly appropriate.  ECG:    Recent Labwork: No results found for requested labs within last 8760 hours.     Component Value Date/Time   CHOL 128 10/22/2016 0548   TRIG 85 10/22/2016 0548   HDL 39 (L) 10/22/2016 0548   CHOLHDL 3.3 10/22/2016 0548   VLDL 17 10/22/2016 0548   LDLCALC 72 10/22/2016 0548    Other Studies Reviewed Today:  Echocardiogram 09/13/2018: 1. The left ventricle has normal systolic function with an ejection fraction of 60-65%. The cavity size was normal. Left ventricular diastolic Doppler parameters are consistent with pseudonormalization. Elevated mean left atrial pressure. 2. The right ventricle has normal systolic function. The cavity was normal. There is no increase in right ventricular wall thickness. 3. Left atrial size was severely dilated. 4. Right atrial size was mildly dilated. 5. The aortic valve is tricuspid. Moderate thickening of the aortic valve. Moderate calcification of the aortic valve. Aortic valve regurgitation is moderate by color flow Doppler. No stenosis of the aortic valve. Moderate aortic annular calcification  noted. 6. The mitral valve is abnormal. Mild thickening of the mitral valve leaflet. Mild calcification of the mitral valve leaflet. There is mild to moderate mitral annular calcification present. Mitral valve regurgitation is moderate by color flow Doppler. 7. The aortic root is normal in size and  structure. 8. Pulmonary hypertension is indeterminant, inadequate TR jet.   Assessment and Plan:  1. Paroxysmal atrial fibrillation (HCC)   2. Chronic diastolic heart failure (HCC)    1. Paroxysmal atrial fibrillation (HCC) Pulse is regular at a rate of 80 today.  Continue amiodarone 200 mg daily.  Continue warfarin 2.5-5 by mouth.  Take 1 tablet 5 mg all days except on Wednesday when you will take 1/2 tablet.  Coumadin therapy managed by PCP  2. Chronic diastolic heart failure (HCC) Recent Crt 1.28 and GFR 37.  Continue Lasix 40 mg every other day and 60 mg every other day alternating.  Continue metolazone 2.5 mg by mouth 2 times a week on Mondays and Thursdays.  Continue Klor-Con 20 mEq alternating with 40 mEq coinciding with dosages of Lasix  Medication Adjustments/Labs and Tests Ordered: Current medicines are reviewed at length with the patient today.  Concerns regarding medicines are outlined above.   Disposition: Follow-up with [name] or APP 3 months  Signed, Rennis Harding, NP 08/20/2019 3:59 PM    [facility] at [facility] [address] [place], [place], [place] [address] Phone: [phone]; Fax: [phone]

## 2019-08-20 NOTE — Patient Instructions (Signed)
Medication Instructions:  Continue all current medications.  Labwork: none  Testing/Procedures: none  Follow-Up: 3 months   Any Other Special Instructions Will Be Listed Below (If Applicable).  If you need a refill on your cardiac medications before your next appointment, please call your pharmacy.  

## 2019-08-29 DIAGNOSIS — I509 Heart failure, unspecified: Secondary | ICD-10-CM | POA: Diagnosis not present

## 2019-08-29 DIAGNOSIS — E7800 Pure hypercholesterolemia, unspecified: Secondary | ICD-10-CM | POA: Diagnosis not present

## 2019-08-29 DIAGNOSIS — I10 Essential (primary) hypertension: Secondary | ICD-10-CM | POA: Diagnosis not present

## 2019-08-29 DIAGNOSIS — I4891 Unspecified atrial fibrillation: Secondary | ICD-10-CM | POA: Diagnosis not present

## 2019-09-20 DIAGNOSIS — I10 Essential (primary) hypertension: Secondary | ICD-10-CM | POA: Diagnosis not present

## 2019-09-20 DIAGNOSIS — Z299 Encounter for prophylactic measures, unspecified: Secondary | ICD-10-CM | POA: Diagnosis not present

## 2019-09-20 DIAGNOSIS — I4891 Unspecified atrial fibrillation: Secondary | ICD-10-CM | POA: Diagnosis not present

## 2019-09-28 DIAGNOSIS — I4891 Unspecified atrial fibrillation: Secondary | ICD-10-CM | POA: Diagnosis not present

## 2019-09-28 DIAGNOSIS — I10 Essential (primary) hypertension: Secondary | ICD-10-CM | POA: Diagnosis not present

## 2019-09-28 DIAGNOSIS — I509 Heart failure, unspecified: Secondary | ICD-10-CM | POA: Diagnosis not present

## 2019-09-28 DIAGNOSIS — E7800 Pure hypercholesterolemia, unspecified: Secondary | ICD-10-CM | POA: Diagnosis not present

## 2019-10-16 ENCOUNTER — Other Ambulatory Visit: Payer: Self-pay | Admitting: Cardiology

## 2019-10-26 DIAGNOSIS — I4891 Unspecified atrial fibrillation: Secondary | ICD-10-CM | POA: Diagnosis not present

## 2019-10-26 DIAGNOSIS — Z299 Encounter for prophylactic measures, unspecified: Secondary | ICD-10-CM | POA: Diagnosis not present

## 2019-10-26 DIAGNOSIS — I10 Essential (primary) hypertension: Secondary | ICD-10-CM | POA: Diagnosis not present

## 2019-11-08 ENCOUNTER — Other Ambulatory Visit: Payer: Self-pay | Admitting: Internal Medicine

## 2019-11-22 ENCOUNTER — Ambulatory Visit (INDEPENDENT_AMBULATORY_CARE_PROVIDER_SITE_OTHER): Payer: Medicare Other | Admitting: Cardiology

## 2019-11-22 ENCOUNTER — Encounter: Payer: Self-pay | Admitting: Cardiology

## 2019-11-22 VITALS — BP 140/92 | HR 76 | Ht 62.0 in | Wt 216.0 lb

## 2019-11-22 DIAGNOSIS — I480 Paroxysmal atrial fibrillation: Secondary | ICD-10-CM

## 2019-11-22 DIAGNOSIS — I5032 Chronic diastolic (congestive) heart failure: Secondary | ICD-10-CM

## 2019-11-22 MED ORDER — FUROSEMIDE 40 MG PO TABS: ORAL_TABLET | ORAL | 3 refills | Status: DC

## 2019-11-22 NOTE — Addendum Note (Signed)
Addended by: Burman Nieves T on: 11/22/2019 04:18 PM   Modules accepted: Orders

## 2019-11-22 NOTE — Progress Notes (Signed)
Cardiology Office Note  Date: 11/22/2019   ID: Danielle Brooks, DOB 1928-09-19, MRN [id number]  PCP:  Ignatius Specking, MD  Cardiologist:  Nona Dell, MD Electrophysiologist:  None   Chief Complaint  Patient presents with  . Cardiac follow-up    History of Present Illness: Danielle Brooks is an 86 y.o. female last seen in June by Mr. Vincenza Hews NP.  She presents for a routine visit with her home assistant.  She is in a wheelchair today.  She does not report any sense of palpitations or worsening breathlessness, I do note that her weight is creeping back up however.  She states that she has not been weighing herself daily.  She is on Coumadin with follow-up by Dr. Sherril Croon.  No reported bleeding problems.  ECG shows that she is in rate controlled atrial fibrillation today.  She has remained on amiodarone with paroxysmal atrial fibrillation.  She is not on beta-blocker with history of symptomatic bradycardia.  I reviewed her lab work from June at which point renal function and potassium are stable.  Past Medical History:  Diagnosis Date  . Atrial fibrillation (HCC)    Paroxysmal, history of normal LV and negative ischemic workup  . Chronic diastolic heart failure (HCC)    EF 55-60% w/severe pulmonary HTN noted on echo 10/20/16   . Mixed hyperlipidemia   . Osteoporosis   . TIA (transient ischemic attack)   . Vitamin D deficiency     Past Surgical History:  Procedure Laterality Date  . DILATION AND CURETTAGE OF UTERUS  1999    Current Outpatient Medications  Medication Sig Dispense Refill  . amiodarone (PACERONE) 200 MG tablet Take 200 mg by mouth daily.    Marland Kitchen donepezil (ARICEPT) 5 MG tablet Take 5 mg by mouth daily.    . furosemide (LASIX) 40 MG tablet Take 60 mg by mouth DAILY 135 tablet 3  . losartan (COZAAR) 25 MG tablet TAKE 1/2 TABLET BY MOUTH DAILY 45 tablet 2  . metolazone (ZAROXOLYN) 2.5 MG tablet Take 1 tablet (2.5 mg total) by mouth 2 (two) times a week. ON Monday AND THURSDAY  8 tablet 3  . potassium chloride SA (KLOR-CON) 20 MEQ tablet TAKE ONE TABLET BY MOUTH EVERY *OTHER* DAY ALTERNATING WITH TWO TABLETS. 45 tablet 6  . traZODone (DESYREL) 100 MG tablet Take 100 mg by mouth at bedtime as needed.     . Vitamin D, Ergocalciferol, (DRISDOL) 50000 UNITS CAPS Take 50,000 Units by mouth every Tuesday.     . warfarin (COUMADIN) 5 MG tablet Take 2.5-5 mg by mouth See admin instructions. Take 1 tablet (5mg ) all days except on Wednesday take 1/2 tablet (2.5mg ) Managed by Saturday office     No current facility-administered medications for this visit.   Allergies:  Bactrim [sulfamethoxazole-trimethoprim], Codeine, and Novocain [procaine hcl]   ROS:   Hearing loss.  Physical Exam: VS:  BP (!) 140/92   Pulse 76   Ht 5\' 2"  (1.575 m)   Wt 216 lb (98 kg)   SpO2 97%   BMI 39.51 kg/m , BMI Body mass index is 39.51 kg/m.  Wt Readings from Last 3 Encounters:  11/22/19 216 lb (98 kg)  08/20/19 209 lb (94.8 kg)  07/09/19 208 lb 3.2 oz (94.4 kg)    General: Elderly woman, in wheelchair today. HEENT: Conjunctiva and lids normal, wearing a mask. Neck: Supple, no elevated JVP or carotid bruits, no thyromegaly. Lungs: Clear to auscultation, nonlabored breathing at rest.  Cardiac: Irregularly irregular, no S3. Extremities: Leg edema/lymphedema as before.  ECG:  An ECG dated 10/27/2018 was personally reviewed today and demonstrated:  Sinus rhythm with prolonged PR interval.  Recent Labwork:  May 2021: BUN 19, creatinine 0.92, potassium 4.2, AST 20, ALT 16, TSH 2.84 June 2021: Potassium 4.0, BUN 23, creatinine 1.28  Other Studies Reviewed Today:  Echocardiogram 09/13/2018: 1. The left ventricle has normal systolic function with an ejection fraction of 60-65%. The cavity size was normal. Left ventricular diastolic Doppler parameters are consistent with pseudonormalization. Elevated mean left atrial pressure. 2. The right ventricle has normal systolic function. The cavity was  normal. There is no increase in right ventricular wall thickness. 3. Left atrial size was severely dilated. 4. Right atrial size was mildly dilated. 5. The aortic valve is tricuspid. Moderate thickening of the aortic valve. Moderate calcification of the aortic valve. Aortic valve regurgitation is moderate by color flow Doppler. No stenosis of the aortic valve. Moderate aortic annular calcification  noted. 6. The mitral valve is abnormal. Mild thickening of the mitral valve leaflet. Mild calcification of the mitral valve leaflet. There is mild to moderate mitral annular calcification present. Mitral valve regurgitation is moderate by color flow Doppler. 7. The aortic root is normal in size and structure. 8. Pulmonary hypertension is indeterminant, inadequate TR jet.  Assessment and Plan:  1.  Chronic diastolic heart failure.  Weight is creeping back up.  We will increase Lasix to 60 mg daily, continue twice weekly metolazone.  Follow-up BMET in 2 weeks.  2.  Paroxysmal to persistent atrial fibrillation.  She remains on low-dose amiodarone and Coumadin.  Not on beta-blocker given history of symptomatic bradycardia.  She was asymptomatic and rate controlled atrial fibrillation today.  Medication Adjustments/Labs and Tests Ordered: Current medicines are reviewed at length with the patient today.  Concerns regarding medicines are outlined above.   Tests Ordered: Orders Placed This Encounter  Procedures  . Basic Metabolic Panel (BMET)    Medication Changes: Meds ordered this encounter  Medications  . furosemide (LASIX) 40 MG tablet    Sig: Take 60 mg by mouth DAILY    Dispense:  135 tablet    Refill:  3    07/09/2019 dose increase    Disposition:  Follow up 2 months in the [place] office.  Signed, Jonelle Sidle, MD, [facility] 11/22/2019 3:32 PM    [facility] at [facility] [address] [place], [place], [place] [address] Phone: [phone]; Fax: [phone]

## 2019-11-22 NOTE — Patient Instructions (Signed)
Your physician recommends that you schedule a follow-up appointment in: 2 MONTHS WITH DR MCDOWELL  Your physician has recommended you make the following change in your medication:   CHANGE LASIX 60 MG DAILY   AMIODARONE 200 MG DAILY   Your physician recommends that you return for lab work in: 2 WEEKS BMP  Thank you for choosing [facility]!!

## 2019-11-27 DIAGNOSIS — I4891 Unspecified atrial fibrillation: Secondary | ICD-10-CM | POA: Diagnosis not present

## 2019-11-27 DIAGNOSIS — I5030 Unspecified diastolic (congestive) heart failure: Secondary | ICD-10-CM | POA: Diagnosis not present

## 2019-11-27 DIAGNOSIS — Z299 Encounter for prophylactic measures, unspecified: Secondary | ICD-10-CM | POA: Diagnosis not present

## 2019-11-27 DIAGNOSIS — I10 Essential (primary) hypertension: Secondary | ICD-10-CM | POA: Diagnosis not present

## 2019-11-29 DIAGNOSIS — I10 Essential (primary) hypertension: Secondary | ICD-10-CM | POA: Diagnosis not present

## 2019-11-29 DIAGNOSIS — I4891 Unspecified atrial fibrillation: Secondary | ICD-10-CM | POA: Diagnosis not present

## 2019-11-29 DIAGNOSIS — E7800 Pure hypercholesterolemia, unspecified: Secondary | ICD-10-CM | POA: Diagnosis not present

## 2019-11-29 DIAGNOSIS — I509 Heart failure, unspecified: Secondary | ICD-10-CM | POA: Diagnosis not present

## 2019-12-06 DIAGNOSIS — I480 Paroxysmal atrial fibrillation: Secondary | ICD-10-CM | POA: Diagnosis not present

## 2019-12-07 ENCOUNTER — Telehealth: Payer: Self-pay | Admitting: *Deleted

## 2019-12-07 DIAGNOSIS — I5032 Chronic diastolic (congestive) heart failure: Secondary | ICD-10-CM

## 2019-12-07 DIAGNOSIS — I10 Essential (primary) hypertension: Secondary | ICD-10-CM

## 2019-12-07 MED ORDER — FUROSEMIDE 40 MG PO TABS: 40.0000 mg | ORAL_TABLET | ORAL | 3 refills | Status: DC

## 2019-12-07 NOTE — Telephone Encounter (Signed)
Patient informed and verbalized understanding of plan. Lab order faxed to [facility] lab. Copy sent to PCP

## 2019-12-07 NOTE — Telephone Encounter (Signed)
-----   Message from Jonelle Sidle, MD sent at 12/07/2019  2:29 PM EDT ----- Results reviewed.  Creatinine up to 1.41 and potassium normal at 4.1.  Please check with her about shortness of breath and weight status.  We increased her diuretics at last visit and may need to go back to the prior dose with as neede d intensification based on weight gain.

## 2019-12-07 NOTE — Telephone Encounter (Signed)
Patient informed and reports that she has lost 10 lbs and sob is about the same

## 2019-12-27 DIAGNOSIS — Z299 Encounter for prophylactic measures, unspecified: Secondary | ICD-10-CM | POA: Diagnosis not present

## 2019-12-27 DIAGNOSIS — I5032 Chronic diastolic (congestive) heart failure: Secondary | ICD-10-CM | POA: Diagnosis not present

## 2019-12-27 DIAGNOSIS — I4891 Unspecified atrial fibrillation: Secondary | ICD-10-CM | POA: Diagnosis not present

## 2019-12-27 DIAGNOSIS — I10 Essential (primary) hypertension: Secondary | ICD-10-CM | POA: Diagnosis not present

## 2019-12-28 DIAGNOSIS — I4891 Unspecified atrial fibrillation: Secondary | ICD-10-CM | POA: Diagnosis not present

## 2019-12-28 DIAGNOSIS — I10 Essential (primary) hypertension: Secondary | ICD-10-CM | POA: Diagnosis not present

## 2019-12-28 DIAGNOSIS — E7800 Pure hypercholesterolemia, unspecified: Secondary | ICD-10-CM | POA: Diagnosis not present

## 2019-12-28 DIAGNOSIS — I509 Heart failure, unspecified: Secondary | ICD-10-CM | POA: Diagnosis not present

## 2020-01-18 DIAGNOSIS — I10 Essential (primary) hypertension: Secondary | ICD-10-CM | POA: Diagnosis not present

## 2020-01-18 DIAGNOSIS — I5032 Chronic diastolic (congestive) heart failure: Secondary | ICD-10-CM | POA: Diagnosis not present

## 2020-01-21 ENCOUNTER — Encounter: Payer: Self-pay | Admitting: *Deleted

## 2020-01-22 ENCOUNTER — Other Ambulatory Visit: Payer: Self-pay

## 2020-01-22 ENCOUNTER — Encounter: Payer: Self-pay | Admitting: Cardiology

## 2020-01-22 ENCOUNTER — Ambulatory Visit (INDEPENDENT_AMBULATORY_CARE_PROVIDER_SITE_OTHER): Payer: Medicare Other | Admitting: Cardiology

## 2020-01-22 VITALS — BP 102/68 | HR 71 | Ht 62.0 in | Wt 216.0 lb

## 2020-01-22 DIAGNOSIS — I5032 Chronic diastolic (congestive) heart failure: Secondary | ICD-10-CM | POA: Diagnosis not present

## 2020-01-22 DIAGNOSIS — I480 Paroxysmal atrial fibrillation: Secondary | ICD-10-CM

## 2020-01-22 NOTE — Progress Notes (Signed)
Cardiology Office Note  Date: 01/22/2020   ID: Danielle Brooks, DOB [birth year], MRN [id number]  PCP:  Ignatius Specking, MD  Cardiologist:  Nona Dell, MD Electrophysiologist:  None   Chief Complaint  Patient presents with  . Cardiac follow-up    History of Present Illness: Danielle Brooks is an 86 y.o. female last seen in September.  She is here today with an Geophysicist/field seismologist for follow-up.  She does not report any palpitations, has been more short of breath and her weight has crept back up.  She remains on Coumadin with follow-up by Dr. Sherril Croon.  No spontaneous bleeding problems.  Since lab work in October Lasix was changed to 40 mg alternating with 60 mg every other day along with consistent dose of metolazone.  Recommended that Lasix could be increased to 60 mg daily for a few days if weight goes up 2 to 3 pounds in 24 hours.  Past Medical History:  Diagnosis Date  . Atrial fibrillation (HCC)    Paroxysmal, history of normal LV and negative ischemic workup  . Chronic diastolic heart failure (HCC)    EF 55-60% w/severe pulmonary HTN noted on echo 10/20/16   . Mixed hyperlipidemia   . Osteoporosis   . TIA (transient ischemic attack)   . Vitamin D deficiency     Past Surgical History:  Procedure Laterality Date  . DILATION AND CURETTAGE OF UTERUS  1999    Current Outpatient Medications  Medication Sig Dispense Refill  . amiodarone (PACERONE) 200 MG tablet Take 200 mg by mouth daily.    Marland Kitchen donepezil (ARICEPT) 5 MG tablet Take 5 mg by mouth daily.    . furosemide (LASIX) 40 MG tablet Take 1-1.5 tablets (40-60 mg total) by mouth every other day. 135 tablet 3  . losartan (COZAAR) 25 MG tablet TAKE 1/2 TABLET BY MOUTH DAILY 45 tablet 2  . metolazone (ZAROXOLYN) 2.5 MG tablet Take 1 tablet (2.5 mg total) by mouth 2 (two) times a week. ON Monday AND THURSDAY 8 tablet 3  . potassium chloride SA (KLOR-CON) 20 MEQ tablet TAKE ONE TABLET BY MOUTH EVERY *OTHER* DAY ALTERNATING WITH TWO TABLETS.  45 tablet 6  . traZODone (DESYREL) 100 MG tablet Take 100 mg by mouth at bedtime as needed.     . Vitamin D, Ergocalciferol, (DRISDOL) 50000 UNITS CAPS Take 50,000 Units by mouth every Tuesday.     . warfarin (COUMADIN) 5 MG tablet Take 2.5-5 mg by mouth See admin instructions. Take 1 tablet (5mg ) all days except on Wednesday take 1/2 tablet (2.5mg ) Managed by Wednesday office     No current facility-administered medications for this visit.   Allergies:  Bactrim [sulfamethoxazole-trimethoprim], Codeine, and Novocain [procaine hcl]   ROS: No palpitations or syncope.  Physical Exam: VS:  BP 102/68   Pulse 71   Ht 5\' 2"  (1.575 m)   Wt 216 lb (98 kg)   SpO2 96%   BMI 39.51 kg/m , BMI Body mass index is 39.51 kg/m.  Wt Readings from Last 3 Encounters:  01/22/20 216 lb (98 kg)  11/22/19 216 lb (98 kg)  08/20/19 209 lb (94.8 kg)    General: Elderly woman, seated in wheelchair.  No distress. HEENT: Conjunctiva and lids normal, wearing a mask. Neck: Supple, no elevated JVP or carotid bruits, no thyromegaly. Lungs: Decreased breath sounds at the bases, nonlabored breathing at rest. Cardiac: Irregularly irregular, no S3 or significant systolic murmur, no pericardial rub. Extremities: Leg edema/lymphedema, stable.  ECG:  An ECG dated 11/22/2019 was personally reviewed today and demonstrated:  Rate controlled atrial fibrillation with low voltage and prolonged QT interval.  Recent Labwork:  October 2021: Potassium 4.1, BUN 23, creatinine 1.41  Other Studies Reviewed Today:  Echocardiogram 09/13/2018: 1. The left ventricle has normal systolic function with an ejection fraction of 60-65%. The cavity size was normal. Left ventricular diastolic Doppler parameters are consistent with pseudonormalization. Elevated mean left atrial pressure. 2. The right ventricle has normal systolic function. The cavity was normal. There is no increase in right ventricular wall thickness. 3. Left atrial size was  severely dilated. 4. Right atrial size was mildly dilated. 5. The aortic valve is tricuspid. Moderate thickening of the aortic valve. Moderate calcification of the aortic valve. Aortic valve regurgitation is moderate by color flow Doppler. No stenosis of the aortic valve. Moderate aortic annular calcification  noted. 6. The mitral valve is abnormal. Mild thickening of the mitral valve leaflet. Mild calcification of the mitral valve leaflet. There is mild to moderate mitral annular calcification present. Mitral valve regurgitation is moderate by color flow Doppler. 7. The aortic root is normal in size and structure. 8. Pulmonary hypertension is indeterminant, inadequate TR jet.  Assessment and Plan:  1.  Acute on chronic diastolic heart failure.  Increase Lasix to 60 mg daily to the end of the week, continue twice weekly metolazone.  Decrease back to previous Lasix dose next week.  Follow-up BMET for next office encounter.  2.  Persistent atrial fibrillation.  She continues on low-dose amiodarone and Coumadin.  No changes in current regimen.  Medication Adjustments/Labs and Tests Ordered: Current medicines are reviewed at length with the patient today.  Concerns regarding medicines are outlined above.   Tests Ordered: Orders Placed This Encounter  Procedures  . Basic metabolic panel    Medication Changes: No orders of the defined types were placed in this encounter.   Disposition:  Follow up 3 months in the [place] office.  Signed, Jonelle Sidle, MD, [facility] 01/22/2020 3:20 PM    [facility] at [facility] [address] [place], [place], [place] [address] Phone: [phone]; Fax: [phone]

## 2020-01-22 NOTE — Patient Instructions (Addendum)
Medication Instructions:   Your physician has recommended you make the following change in your medication:   Take furosemide 60 mg daily through Saturday, then resume previous dose of 40 mg alternating with 60 mg daily  Continue other medications the same  Labwork:  Your physician recommends that you return for lab work in: 3 months just before your next visit to check your BMET. This may be done at [facility].  Testing/Procedures:  None  Follow-Up:  Your physician recommends that you schedule a follow-up appointment in: 3 months.  Any Other Special Instructions Will Be Listed Below (If Applicable).  If you need a refill on your cardiac medications before your next appointment, please call your pharmacy.

## 2020-01-28 DIAGNOSIS — I10 Essential (primary) hypertension: Secondary | ICD-10-CM | POA: Diagnosis not present

## 2020-01-28 DIAGNOSIS — I5030 Unspecified diastolic (congestive) heart failure: Secondary | ICD-10-CM | POA: Diagnosis not present

## 2020-01-28 DIAGNOSIS — I4891 Unspecified atrial fibrillation: Secondary | ICD-10-CM | POA: Diagnosis not present

## 2020-01-28 DIAGNOSIS — Z299 Encounter for prophylactic measures, unspecified: Secondary | ICD-10-CM | POA: Diagnosis not present

## 2020-01-29 DIAGNOSIS — E7800 Pure hypercholesterolemia, unspecified: Secondary | ICD-10-CM | POA: Diagnosis not present

## 2020-01-29 DIAGNOSIS — I10 Essential (primary) hypertension: Secondary | ICD-10-CM | POA: Diagnosis not present

## 2020-01-29 DIAGNOSIS — I4891 Unspecified atrial fibrillation: Secondary | ICD-10-CM | POA: Diagnosis not present

## 2020-01-29 DIAGNOSIS — I509 Heart failure, unspecified: Secondary | ICD-10-CM | POA: Diagnosis not present

## 2020-02-08 ENCOUNTER — Other Ambulatory Visit: Payer: Self-pay | Admitting: Cardiology

## 2020-02-08 ENCOUNTER — Other Ambulatory Visit: Payer: Self-pay

## 2020-02-13 ENCOUNTER — Telehealth: Payer: Self-pay | Admitting: Cardiology

## 2020-02-13 NOTE — Telephone Encounter (Signed)
Reports increased SOB for several days. Reports chest congestion and runny nose. Denies chest pain or dizziness. Denies increased swelling Audible wheezing while on phone Did not weigh today d/t SOB and reports was unable to eat breakfast d/t SOB. Reports no weights checked this week BP 124/60 HR 80. Has not checked O2 Sat Denies fever, n/v, headache, abd pain  Advised to go to the ED now for an evaluation. Verbalized understanding of plan

## 2020-02-13 NOTE — Telephone Encounter (Signed)
Pt is having shortness of breath and not eating good. She's is refusing to go to the ER to be seen. Would like to nurse to give her a call [phone]

## 2020-02-15 ENCOUNTER — Other Ambulatory Visit: Payer: Self-pay | Admitting: Cardiology

## 2020-02-25 DIAGNOSIS — I4891 Unspecified atrial fibrillation: Secondary | ICD-10-CM | POA: Diagnosis not present

## 2020-02-25 DIAGNOSIS — Z299 Encounter for prophylactic measures, unspecified: Secondary | ICD-10-CM | POA: Diagnosis not present

## 2020-02-25 DIAGNOSIS — I5032 Chronic diastolic (congestive) heart failure: Secondary | ICD-10-CM | POA: Diagnosis not present

## 2020-02-25 DIAGNOSIS — I10 Essential (primary) hypertension: Secondary | ICD-10-CM | POA: Diagnosis not present

## 2020-02-28 DIAGNOSIS — I4891 Unspecified atrial fibrillation: Secondary | ICD-10-CM | POA: Diagnosis not present

## 2020-02-28 DIAGNOSIS — E7800 Pure hypercholesterolemia, unspecified: Secondary | ICD-10-CM | POA: Diagnosis not present

## 2020-02-28 DIAGNOSIS — I10 Essential (primary) hypertension: Secondary | ICD-10-CM | POA: Diagnosis not present

## 2020-02-28 DIAGNOSIS — I509 Heart failure, unspecified: Secondary | ICD-10-CM | POA: Diagnosis not present

## 2020-03-27 DIAGNOSIS — Z299 Encounter for prophylactic measures, unspecified: Secondary | ICD-10-CM | POA: Diagnosis not present

## 2020-03-27 DIAGNOSIS — I10 Essential (primary) hypertension: Secondary | ICD-10-CM | POA: Diagnosis not present

## 2020-03-27 DIAGNOSIS — I5030 Unspecified diastolic (congestive) heart failure: Secondary | ICD-10-CM | POA: Diagnosis not present

## 2020-03-27 DIAGNOSIS — I4891 Unspecified atrial fibrillation: Secondary | ICD-10-CM | POA: Diagnosis not present

## 2020-05-05 ENCOUNTER — Telehealth: Payer: Self-pay | Admitting: Cardiology

## 2020-05-05 DIAGNOSIS — I4891 Unspecified atrial fibrillation: Secondary | ICD-10-CM | POA: Diagnosis not present

## 2020-05-05 DIAGNOSIS — Z789 Other specified health status: Secondary | ICD-10-CM | POA: Diagnosis not present

## 2020-05-05 DIAGNOSIS — M545 Low back pain, unspecified: Secondary | ICD-10-CM | POA: Diagnosis not present

## 2020-05-05 DIAGNOSIS — Z299 Encounter for prophylactic measures, unspecified: Secondary | ICD-10-CM | POA: Diagnosis not present

## 2020-05-05 DIAGNOSIS — R296 Repeated falls: Secondary | ICD-10-CM | POA: Diagnosis not present

## 2020-05-05 DIAGNOSIS — Z6827 Body mass index (BMI) 27.0-27.9, adult: Secondary | ICD-10-CM | POA: Diagnosis not present

## 2020-05-05 DIAGNOSIS — R531 Weakness: Secondary | ICD-10-CM | POA: Diagnosis not present

## 2020-05-05 NOTE — Telephone Encounter (Signed)
Patient's care taker/family member([name]) called states they need order for home health. She has contacted her PCP but they want her to come into the office but she said Danielle Brooks does not have the strength to go to appointment.She is confined to her bed. She would like to see if [name] could put in the order. She can be reached at [phone].

## 2020-05-05 NOTE — Telephone Encounter (Signed)
Danielle Brooks that she isn't listed on DPR and would need to speak directly to patient or husband. Spoke with husband and advised that home health orders would need to come from PCP. Verbalized understanding.

## 2020-05-07 DIAGNOSIS — Z7901 Long term (current) use of anticoagulants: Secondary | ICD-10-CM | POA: Diagnosis not present

## 2020-05-07 DIAGNOSIS — E559 Vitamin D deficiency, unspecified: Secondary | ICD-10-CM | POA: Diagnosis not present

## 2020-05-07 DIAGNOSIS — M7121 Synovial cyst of popliteal space [Baker], right knee: Secondary | ICD-10-CM | POA: Diagnosis not present

## 2020-05-07 DIAGNOSIS — Z8673 Personal history of transient ischemic attack (TIA), and cerebral infarction without residual deficits: Secondary | ICD-10-CM | POA: Diagnosis not present

## 2020-05-07 DIAGNOSIS — M419 Scoliosis, unspecified: Secondary | ICD-10-CM | POA: Diagnosis not present

## 2020-05-07 DIAGNOSIS — Z993 Dependence on wheelchair: Secondary | ICD-10-CM | POA: Diagnosis not present

## 2020-05-07 DIAGNOSIS — G4700 Insomnia, unspecified: Secondary | ICD-10-CM | POA: Diagnosis not present

## 2020-05-07 DIAGNOSIS — Z9181 History of falling: Secondary | ICD-10-CM | POA: Diagnosis not present

## 2020-05-07 DIAGNOSIS — Z5181 Encounter for therapeutic drug level monitoring: Secondary | ICD-10-CM | POA: Diagnosis not present

## 2020-05-07 DIAGNOSIS — I110 Hypertensive heart disease with heart failure: Secondary | ICD-10-CM | POA: Diagnosis not present

## 2020-05-07 DIAGNOSIS — I5032 Chronic diastolic (congestive) heart failure: Secondary | ICD-10-CM | POA: Diagnosis not present

## 2020-05-07 DIAGNOSIS — I482 Chronic atrial fibrillation, unspecified: Secondary | ICD-10-CM | POA: Diagnosis not present

## 2020-05-07 DIAGNOSIS — F419 Anxiety disorder, unspecified: Secondary | ICD-10-CM | POA: Diagnosis not present

## 2020-05-07 DIAGNOSIS — M170 Bilateral primary osteoarthritis of knee: Secondary | ICD-10-CM | POA: Diagnosis not present

## 2020-05-07 DIAGNOSIS — F0390 Unspecified dementia without behavioral disturbance: Secondary | ICD-10-CM | POA: Diagnosis not present

## 2020-05-07 DIAGNOSIS — M810 Age-related osteoporosis without current pathological fracture: Secondary | ICD-10-CM | POA: Diagnosis not present

## 2020-05-07 DIAGNOSIS — Z79891 Long term (current) use of opiate analgesic: Secondary | ICD-10-CM | POA: Diagnosis not present

## 2020-05-07 DIAGNOSIS — E7800 Pure hypercholesterolemia, unspecified: Secondary | ICD-10-CM | POA: Diagnosis not present

## 2020-05-09 ENCOUNTER — Ambulatory Visit: Payer: Medicare Other | Admitting: Cardiology

## 2020-05-09 DIAGNOSIS — F0390 Unspecified dementia without behavioral disturbance: Secondary | ICD-10-CM | POA: Diagnosis not present

## 2020-05-09 DIAGNOSIS — I110 Hypertensive heart disease with heart failure: Secondary | ICD-10-CM | POA: Diagnosis not present

## 2020-05-09 DIAGNOSIS — I5032 Chronic diastolic (congestive) heart failure: Secondary | ICD-10-CM | POA: Diagnosis not present

## 2020-05-09 DIAGNOSIS — M7121 Synovial cyst of popliteal space [Baker], right knee: Secondary | ICD-10-CM | POA: Diagnosis not present

## 2020-05-09 DIAGNOSIS — I482 Chronic atrial fibrillation, unspecified: Secondary | ICD-10-CM | POA: Diagnosis not present

## 2020-05-09 DIAGNOSIS — M170 Bilateral primary osteoarthritis of knee: Secondary | ICD-10-CM | POA: Diagnosis not present

## 2020-05-12 ENCOUNTER — Telehealth: Payer: Self-pay | Admitting: Cardiology

## 2020-05-12 NOTE — Telephone Encounter (Signed)
New message   Patient care giver Danielle Brooks called in to cancel appointment for Tuesday March 22,22 she said to let Danielle Brooks know that Danielle Brooks fell on March 4,2022 and they can not get her out of the house to bring her in for the appointment, I offered to switch the appointment to virtual and she refused at this time. Would like to know how they are to proceed with Danielle Brooks cardiac care until they can get her into the office for an appointment?

## 2020-05-12 NOTE — Telephone Encounter (Signed)
Explained to husband, Danielle Brooks, that the visit can be done by phone and he could discuss any questions or concerns about her condition during that time. Husband agreed to phone visit.     Patient Consent for Virtual Visit         Danielle Brooks has provided verbal consent on 05/12/2020 for a virtual visit (video or telephone).   CONSENT FOR VIRTUAL VISIT FOR:  Danielle Brooks  By participating in this virtual visit I agree to the following:  I hereby voluntarily request, consent and authorize [facility] and its employed or contracted physicians, physician assistants, nurse practitioners or other licensed health care professionals (the Practitioner), to provide me with telemedicine health care services (the "Services") as deemed necessary by the treating Practitioner. I acknowledge and consent to receive the Services by the Practitioner via telemedicine. I understand that the telemedicine visit will involve communicating with the Practitioner through live audiovisual communication technology and the disclosure of certain medical information by electronic transmission. I acknowledge that I have been given the opportunity to request an in-person assessment or other available alternative prior to the telemedicine visit and am voluntarily participating in the telemedicine visit.  I understand that I have the right to withhold or withdraw my consent to the use of telemedicine in the course of my care at any time, without affecting my right to future care or treatment, and that the Practitioner or I may terminate the telemedicine visit at any time. I understand that I have the right to inspect all information obtained and/or recorded in the course of the telemedicine visit and may receive copies of available information for a reasonable fee.  I understand that some of the potential risks of receiving the Services via telemedicine include:  Marland Kitchen Delay or interruption in medical evaluation due to technological  equipment failure or disruption; . Information transmitted may not be sufficient (e.g. poor resolution of images) to allow for appropriate medical decision making by the Practitioner; and/or  . In rare instances, security protocols could fail, causing a breach of personal health information.  Furthermore, I acknowledge that it is my responsibility to provide information about my medical history, conditions and care that is complete and accurate to the best of my ability. I acknowledge that Practitioner's advice, recommendations, and/or decision may be based on factors not within their control, such as incomplete or inaccurate data provided by me or distortions of diagnostic images or specimens that may result from electronic transmissions. I understand that the practice of medicine is not an exact science and that Practitioner makes no warranties or guarantees regarding treatment outcomes. I acknowledge that a copy of this consent can be made available to me via my patient portal [facility] MyChart), or I can request a printed copy by calling the office of [facility].    I understand that my insurance will be billed for this visit.   I have read or had this consent read to me. . I understand the contents of this consent, which adequately explains the benefits and risks of the Services being provided via telemedicine.  . I have been provided ample opportunity to ask questions regarding this consent and the Services and have had my questions answered to my satisfaction. . I give my informed consent for the services to be provided through the use of telemedicine in my medical care

## 2020-05-13 DIAGNOSIS — M7121 Synovial cyst of popliteal space [Baker], right knee: Secondary | ICD-10-CM | POA: Diagnosis not present

## 2020-05-13 DIAGNOSIS — I5032 Chronic diastolic (congestive) heart failure: Secondary | ICD-10-CM | POA: Diagnosis not present

## 2020-05-13 DIAGNOSIS — F0390 Unspecified dementia without behavioral disturbance: Secondary | ICD-10-CM | POA: Diagnosis not present

## 2020-05-13 DIAGNOSIS — I110 Hypertensive heart disease with heart failure: Secondary | ICD-10-CM | POA: Diagnosis not present

## 2020-05-13 DIAGNOSIS — I482 Chronic atrial fibrillation, unspecified: Secondary | ICD-10-CM | POA: Diagnosis not present

## 2020-05-13 DIAGNOSIS — M170 Bilateral primary osteoarthritis of knee: Secondary | ICD-10-CM | POA: Diagnosis not present

## 2020-05-14 DIAGNOSIS — I5032 Chronic diastolic (congestive) heart failure: Secondary | ICD-10-CM | POA: Diagnosis not present

## 2020-05-14 DIAGNOSIS — F0390 Unspecified dementia without behavioral disturbance: Secondary | ICD-10-CM | POA: Diagnosis not present

## 2020-05-14 DIAGNOSIS — I110 Hypertensive heart disease with heart failure: Secondary | ICD-10-CM | POA: Diagnosis not present

## 2020-05-14 DIAGNOSIS — M170 Bilateral primary osteoarthritis of knee: Secondary | ICD-10-CM | POA: Diagnosis not present

## 2020-05-14 DIAGNOSIS — M7121 Synovial cyst of popliteal space [Baker], right knee: Secondary | ICD-10-CM | POA: Diagnosis not present

## 2020-05-14 DIAGNOSIS — I482 Chronic atrial fibrillation, unspecified: Secondary | ICD-10-CM | POA: Diagnosis not present

## 2020-05-16 DIAGNOSIS — I110 Hypertensive heart disease with heart failure: Secondary | ICD-10-CM | POA: Diagnosis not present

## 2020-05-16 DIAGNOSIS — F0390 Unspecified dementia without behavioral disturbance: Secondary | ICD-10-CM | POA: Diagnosis not present

## 2020-05-16 DIAGNOSIS — M170 Bilateral primary osteoarthritis of knee: Secondary | ICD-10-CM | POA: Diagnosis not present

## 2020-05-16 DIAGNOSIS — I482 Chronic atrial fibrillation, unspecified: Secondary | ICD-10-CM | POA: Diagnosis not present

## 2020-05-16 DIAGNOSIS — I5032 Chronic diastolic (congestive) heart failure: Secondary | ICD-10-CM | POA: Diagnosis not present

## 2020-05-16 DIAGNOSIS — M7121 Synovial cyst of popliteal space [Baker], right knee: Secondary | ICD-10-CM | POA: Diagnosis not present

## 2020-05-19 DIAGNOSIS — I110 Hypertensive heart disease with heart failure: Secondary | ICD-10-CM | POA: Diagnosis not present

## 2020-05-19 DIAGNOSIS — I5032 Chronic diastolic (congestive) heart failure: Secondary | ICD-10-CM | POA: Diagnosis not present

## 2020-05-19 DIAGNOSIS — F0390 Unspecified dementia without behavioral disturbance: Secondary | ICD-10-CM | POA: Diagnosis not present

## 2020-05-19 DIAGNOSIS — M7121 Synovial cyst of popliteal space [Baker], right knee: Secondary | ICD-10-CM | POA: Diagnosis not present

## 2020-05-19 DIAGNOSIS — I482 Chronic atrial fibrillation, unspecified: Secondary | ICD-10-CM | POA: Diagnosis not present

## 2020-05-19 DIAGNOSIS — M170 Bilateral primary osteoarthritis of knee: Secondary | ICD-10-CM | POA: Diagnosis not present

## 2020-05-20 ENCOUNTER — Encounter: Payer: Self-pay | Admitting: Cardiology

## 2020-05-20 ENCOUNTER — Ambulatory Visit: Payer: Medicare Other | Admitting: Cardiology

## 2020-05-20 ENCOUNTER — Encounter: Payer: Self-pay | Admitting: *Deleted

## 2020-05-20 ENCOUNTER — Telehealth (INDEPENDENT_AMBULATORY_CARE_PROVIDER_SITE_OTHER): Payer: Medicare Other | Admitting: Cardiology

## 2020-05-20 VITALS — BP 120/80 | Ht 62.0 in | Wt 200.0 lb

## 2020-05-20 DIAGNOSIS — I110 Hypertensive heart disease with heart failure: Secondary | ICD-10-CM | POA: Diagnosis not present

## 2020-05-20 DIAGNOSIS — I5032 Chronic diastolic (congestive) heart failure: Secondary | ICD-10-CM | POA: Diagnosis not present

## 2020-05-20 DIAGNOSIS — I480 Paroxysmal atrial fibrillation: Secondary | ICD-10-CM | POA: Diagnosis not present

## 2020-05-20 DIAGNOSIS — I482 Chronic atrial fibrillation, unspecified: Secondary | ICD-10-CM | POA: Diagnosis not present

## 2020-05-20 DIAGNOSIS — M7121 Synovial cyst of popliteal space [Baker], right knee: Secondary | ICD-10-CM | POA: Diagnosis not present

## 2020-05-20 DIAGNOSIS — F0390 Unspecified dementia without behavioral disturbance: Secondary | ICD-10-CM | POA: Diagnosis not present

## 2020-05-20 DIAGNOSIS — M170 Bilateral primary osteoarthritis of knee: Secondary | ICD-10-CM | POA: Diagnosis not present

## 2020-05-20 NOTE — Patient Instructions (Addendum)
Medication Instructions:   Your physician recommends that you continue on your current medications as directed. Please refer to the Current Medication list given to you today.  Labwork:  none  Testing/Procedures:  none  Follow-Up:  Your physician recommends that you schedule a follow-up appointment in: 3 months.  Any Other Special Instructions Will Be Listed Below (If Applicable).  If you need a refill on your cardiac medications before your next appointment, please call your pharmacy. 

## 2020-05-20 NOTE — Progress Notes (Signed)
Virtual Visit via Telephone Note   This visit type was conducted due to national recommendations for restrictions regarding the COVID-19 Pandemic (e.g. social distancing) in an effort to limit this patient's exposure and mitigate transmission in our community.  Due to her co-morbid illnesses, this patient is at least at moderate risk for complications without adequate follow up.  This format is felt to be most appropriate for this patient at this time.  The patient did not have access to video technology/had technical difficulties with video requiring transitioning to audio format only (telephone).  All issues noted in this document were discussed and addressed.  No physical exam could be performed with this format.  Please refer to the patient's chart for her  consent to telehealth for [facility].    Date:  05/20/2020   ID:  DESHANTI ADCOX, DOB 1928-06-21, MRN [id number] The patient was identified using 2 identifiers.  Patient Location: Home Provider Location: Office/Clinic   PCP:  Ignatius Specking, MD   [facility]  Cardiologist:  Nona Dell, MD  Electrophysiologist:  None [phone]   Evaluation Performed:  Follow-Up Visit  Chief Complaint:  Cardiac follow-up  History of Present Illness:    DEANIA SIGUENZA is an 87 y.o. female seen in November 2021.  We spoke by phone today.  I also talked with her husband.  She had a fall about 3 weeks ago and has been confined to home since then, currently in a hospital bed.  She does have home PT and nursing checking on her regularly and tells me that she has been making progress.  I reviewed her medications, overall stable from a cardiac perspective.  Her weight is actually down significantly since November.  She does not report any progressive swelling.  She has continued on Coumadin with follow-up by Dr. Sherril Croon. INR today was 2.4.  Requesting additional interval lab work.   Past Medical History:  Diagnosis Date  .  Atrial fibrillation (HCC)    Paroxysmal, history of normal LV and negative ischemic workup  . Chronic diastolic heart failure (HCC)    EF 55-60% w/severe pulmonary HTN noted on echo 10/20/16   . Mixed hyperlipidemia   . Osteoporosis   . TIA (transient ischemic attack)   . Vitamin D deficiency    Past Surgical History:  Procedure Laterality Date  . DILATION AND CURETTAGE OF UTERUS  1999     Current Meds  Medication Sig  . amiodarone (PACERONE) 200 MG tablet Take 1 tablet (200 mg total) by mouth daily.  Marland Kitchen donepezil (ARICEPT) 5 MG tablet Take 5 mg by mouth daily.  . furosemide (LASIX) 40 MG tablet Take 1-1.5 tablets (40-60 mg total) by mouth every other day.  . losartan (COZAAR) 25 MG tablet TAKE 1/2 TABLET BY MOUTH DAILY  . metolazone (ZAROXOLYN) 2.5 MG tablet Take 1 tablet (2.5 mg total) by mouth 2 (two) times a week. ON Monday AND THURSDAY (Patient taking differently: Take 2.5 mg by mouth once a week. THURSDAY)  . potassium chloride SA (KLOR-CON) 20 MEQ tablet TAKE ONE TABLET BY MOUTH EVERY *OTHER* DAY ALTERNATING WITH TWO TABLETS.  . traZODone (DESYREL) 100 MG tablet Take 100 mg by mouth at bedtime as needed.   . Vitamin D, Ergocalciferol, (DRISDOL) 50000 UNITS CAPS Take 50,000 Units by mouth every Tuesday.   . warfarin (COUMADIN) 5 MG tablet Take 2.5-5 mg by mouth See admin instructions. Take 1 tablet (5mg ) all days except on Wednesday take 1/2  tablet (2.5mg ) Managed by [facility] office     Allergies:   Bactrim [sulfamethoxazole-trimethoprim], Codeine, and Novocain [procaine hcl]   ROS: No syncope.  Prior CV studies:   The following studies were reviewed today:  Echocardiogram 09/13/2018: 1. The left ventricle has normal systolic function with an ejection fraction of 60-65%. The cavity size was normal. Left ventricular diastolic Doppler parameters are consistent with pseudonormalization. Elevated mean left atrial pressure. 2. The right ventricle has normal systolic function. The  cavity was normal. There is no increase in right ventricular wall thickness. 3. Left atrial size was severely dilated. 4. Right atrial size was mildly dilated. 5. The aortic valve is tricuspid. Moderate thickening of the aortic valve. Moderate calcification of the aortic valve. Aortic valve regurgitation is moderate by color flow Doppler. No stenosis of the aortic valve. Moderate aortic annular calcification  noted. 6. The mitral valve is abnormal. Mild thickening of the mitral valve leaflet. Mild calcification of the mitral valve leaflet. There is mild to moderate mitral annular calcification present. Mitral valve regurgitation is moderate by color flow Doppler. 7. The aortic root is normal in size and structure. 8. Pulmonary hypertension is indeterminant, inadequate TR jet.  Labs/Other Tests and Data Reviewed:    EKG:  An ECG dated 11/22/2019 was personally reviewed today and demonstrated:  Rate controlled atrial fibrillation with low voltage and prolonged QT interval.  Recent Labs:  October 2021: Potassium 4.1, BUN 23, creatinine 1.41  Wt Readings from Last 3 Encounters:  05/20/20 200 lb (90.7 kg)  01/22/20 216 lb (98 kg)  11/22/19 216 lb (98 kg)     Objective:    Vital Signs:  BP 120/80   Ht 5\' 2"  (1.575 m)   Wt 200 lb (90.7 kg)   BMI 36.58 kg/m    Patient spoke in full sentences, did not sound short of breath on the phone.  ASSESSMENT & PLAN:    1.  Chronic diastolic heart failure.  Weight is down significantly from November 2021.  She does not report any progressive shortness of breath or leg swelling.  Continue current doses of Lasix with potassium supplement as well as Zaroxolyn.  Requesting interval lab work from PCP.  2.  Paroxysmal to persistent atrial fibrillation.  Continue amiodarone and Coumadin as before.  No reported palpitations.  Time:   Today, I have spent 10 minutes with the patient with telehealth technology discussing the above problems.      Medication Adjustments/Labs and Tests Ordered: Current medicines are reviewed at length with the patient today.  Concerns regarding medicines are outlined above.   Tests Ordered: No orders of the defined types were placed in this encounter.   Medication Changes: No orders of the defined types were placed in this encounter.   Follow Up:  In Person 3 months in the [place] office.  Signed, [place], MD  05/20/2020 4:16 PM    [facility]

## 2020-05-21 DIAGNOSIS — I482 Chronic atrial fibrillation, unspecified: Secondary | ICD-10-CM | POA: Diagnosis not present

## 2020-05-21 DIAGNOSIS — M170 Bilateral primary osteoarthritis of knee: Secondary | ICD-10-CM | POA: Diagnosis not present

## 2020-05-21 DIAGNOSIS — M7121 Synovial cyst of popliteal space [Baker], right knee: Secondary | ICD-10-CM | POA: Diagnosis not present

## 2020-05-21 DIAGNOSIS — I110 Hypertensive heart disease with heart failure: Secondary | ICD-10-CM | POA: Diagnosis not present

## 2020-05-21 DIAGNOSIS — I5032 Chronic diastolic (congestive) heart failure: Secondary | ICD-10-CM | POA: Diagnosis not present

## 2020-05-21 DIAGNOSIS — F0390 Unspecified dementia without behavioral disturbance: Secondary | ICD-10-CM | POA: Diagnosis not present

## 2020-05-23 DIAGNOSIS — I5032 Chronic diastolic (congestive) heart failure: Secondary | ICD-10-CM | POA: Diagnosis not present

## 2020-05-23 DIAGNOSIS — M7121 Synovial cyst of popliteal space [Baker], right knee: Secondary | ICD-10-CM | POA: Diagnosis not present

## 2020-05-23 DIAGNOSIS — I482 Chronic atrial fibrillation, unspecified: Secondary | ICD-10-CM | POA: Diagnosis not present

## 2020-05-23 DIAGNOSIS — I110 Hypertensive heart disease with heart failure: Secondary | ICD-10-CM | POA: Diagnosis not present

## 2020-05-23 DIAGNOSIS — F0390 Unspecified dementia without behavioral disturbance: Secondary | ICD-10-CM | POA: Diagnosis not present

## 2020-05-23 DIAGNOSIS — M170 Bilateral primary osteoarthritis of knee: Secondary | ICD-10-CM | POA: Diagnosis not present

## 2020-05-26 DIAGNOSIS — F0390 Unspecified dementia without behavioral disturbance: Secondary | ICD-10-CM | POA: Diagnosis not present

## 2020-05-26 DIAGNOSIS — I110 Hypertensive heart disease with heart failure: Secondary | ICD-10-CM | POA: Diagnosis not present

## 2020-05-26 DIAGNOSIS — M170 Bilateral primary osteoarthritis of knee: Secondary | ICD-10-CM | POA: Diagnosis not present

## 2020-05-26 DIAGNOSIS — M7121 Synovial cyst of popliteal space [Baker], right knee: Secondary | ICD-10-CM | POA: Diagnosis not present

## 2020-05-26 DIAGNOSIS — I5032 Chronic diastolic (congestive) heart failure: Secondary | ICD-10-CM | POA: Diagnosis not present

## 2020-05-26 DIAGNOSIS — I482 Chronic atrial fibrillation, unspecified: Secondary | ICD-10-CM | POA: Diagnosis not present

## 2020-05-27 ENCOUNTER — Other Ambulatory Visit: Payer: Self-pay | Admitting: Cardiology

## 2020-05-28 DIAGNOSIS — M170 Bilateral primary osteoarthritis of knee: Secondary | ICD-10-CM | POA: Diagnosis not present

## 2020-05-28 DIAGNOSIS — F0390 Unspecified dementia without behavioral disturbance: Secondary | ICD-10-CM | POA: Diagnosis not present

## 2020-05-28 DIAGNOSIS — I110 Hypertensive heart disease with heart failure: Secondary | ICD-10-CM | POA: Diagnosis not present

## 2020-05-28 DIAGNOSIS — I5032 Chronic diastolic (congestive) heart failure: Secondary | ICD-10-CM | POA: Diagnosis not present

## 2020-05-28 DIAGNOSIS — M7121 Synovial cyst of popliteal space [Baker], right knee: Secondary | ICD-10-CM | POA: Diagnosis not present

## 2020-05-28 DIAGNOSIS — I482 Chronic atrial fibrillation, unspecified: Secondary | ICD-10-CM | POA: Diagnosis not present

## 2020-05-29 DIAGNOSIS — F0390 Unspecified dementia without behavioral disturbance: Secondary | ICD-10-CM | POA: Diagnosis not present

## 2020-05-29 DIAGNOSIS — M170 Bilateral primary osteoarthritis of knee: Secondary | ICD-10-CM | POA: Diagnosis not present

## 2020-05-29 DIAGNOSIS — I110 Hypertensive heart disease with heart failure: Secondary | ICD-10-CM | POA: Diagnosis not present

## 2020-05-29 DIAGNOSIS — M7121 Synovial cyst of popliteal space [Baker], right knee: Secondary | ICD-10-CM | POA: Diagnosis not present

## 2020-05-29 DIAGNOSIS — I5032 Chronic diastolic (congestive) heart failure: Secondary | ICD-10-CM | POA: Diagnosis not present

## 2020-05-29 DIAGNOSIS — I482 Chronic atrial fibrillation, unspecified: Secondary | ICD-10-CM | POA: Diagnosis not present

## 2020-06-02 DIAGNOSIS — M7121 Synovial cyst of popliteal space [Baker], right knee: Secondary | ICD-10-CM | POA: Diagnosis not present

## 2020-06-02 DIAGNOSIS — F0390 Unspecified dementia without behavioral disturbance: Secondary | ICD-10-CM | POA: Diagnosis not present

## 2020-06-02 DIAGNOSIS — M170 Bilateral primary osteoarthritis of knee: Secondary | ICD-10-CM | POA: Diagnosis not present

## 2020-06-02 DIAGNOSIS — I110 Hypertensive heart disease with heart failure: Secondary | ICD-10-CM | POA: Diagnosis not present

## 2020-06-02 DIAGNOSIS — N390 Urinary tract infection, site not specified: Secondary | ICD-10-CM | POA: Diagnosis not present

## 2020-06-02 DIAGNOSIS — I482 Chronic atrial fibrillation, unspecified: Secondary | ICD-10-CM | POA: Diagnosis not present

## 2020-06-02 DIAGNOSIS — I5032 Chronic diastolic (congestive) heart failure: Secondary | ICD-10-CM | POA: Diagnosis not present

## 2020-06-05 DIAGNOSIS — M7121 Synovial cyst of popliteal space [Baker], right knee: Secondary | ICD-10-CM | POA: Diagnosis not present

## 2020-06-05 DIAGNOSIS — I5032 Chronic diastolic (congestive) heart failure: Secondary | ICD-10-CM | POA: Diagnosis not present

## 2020-06-05 DIAGNOSIS — I110 Hypertensive heart disease with heart failure: Secondary | ICD-10-CM | POA: Diagnosis not present

## 2020-06-05 DIAGNOSIS — F0390 Unspecified dementia without behavioral disturbance: Secondary | ICD-10-CM | POA: Diagnosis not present

## 2020-06-05 DIAGNOSIS — I482 Chronic atrial fibrillation, unspecified: Secondary | ICD-10-CM | POA: Diagnosis not present

## 2020-06-05 DIAGNOSIS — M170 Bilateral primary osteoarthritis of knee: Secondary | ICD-10-CM | POA: Diagnosis not present

## 2020-06-06 DIAGNOSIS — Z8673 Personal history of transient ischemic attack (TIA), and cerebral infarction without residual deficits: Secondary | ICD-10-CM | POA: Diagnosis not present

## 2020-06-06 DIAGNOSIS — Z79891 Long term (current) use of opiate analgesic: Secondary | ICD-10-CM | POA: Diagnosis not present

## 2020-06-06 DIAGNOSIS — I110 Hypertensive heart disease with heart failure: Secondary | ICD-10-CM | POA: Diagnosis not present

## 2020-06-06 DIAGNOSIS — F0390 Unspecified dementia without behavioral disturbance: Secondary | ICD-10-CM | POA: Diagnosis not present

## 2020-06-06 DIAGNOSIS — Z9181 History of falling: Secondary | ICD-10-CM | POA: Diagnosis not present

## 2020-06-06 DIAGNOSIS — Z7901 Long term (current) use of anticoagulants: Secondary | ICD-10-CM | POA: Diagnosis not present

## 2020-06-06 DIAGNOSIS — M419 Scoliosis, unspecified: Secondary | ICD-10-CM | POA: Diagnosis not present

## 2020-06-06 DIAGNOSIS — Z993 Dependence on wheelchair: Secondary | ICD-10-CM | POA: Diagnosis not present

## 2020-06-06 DIAGNOSIS — Z5181 Encounter for therapeutic drug level monitoring: Secondary | ICD-10-CM | POA: Diagnosis not present

## 2020-06-06 DIAGNOSIS — I482 Chronic atrial fibrillation, unspecified: Secondary | ICD-10-CM | POA: Diagnosis not present

## 2020-06-06 DIAGNOSIS — M170 Bilateral primary osteoarthritis of knee: Secondary | ICD-10-CM | POA: Diagnosis not present

## 2020-06-06 DIAGNOSIS — I5032 Chronic diastolic (congestive) heart failure: Secondary | ICD-10-CM | POA: Diagnosis not present

## 2020-06-06 DIAGNOSIS — E559 Vitamin D deficiency, unspecified: Secondary | ICD-10-CM | POA: Diagnosis not present

## 2020-06-06 DIAGNOSIS — F419 Anxiety disorder, unspecified: Secondary | ICD-10-CM | POA: Diagnosis not present

## 2020-06-06 DIAGNOSIS — M7121 Synovial cyst of popliteal space [Baker], right knee: Secondary | ICD-10-CM | POA: Diagnosis not present

## 2020-06-06 DIAGNOSIS — M810 Age-related osteoporosis without current pathological fracture: Secondary | ICD-10-CM | POA: Diagnosis not present

## 2020-06-06 DIAGNOSIS — E7800 Pure hypercholesterolemia, unspecified: Secondary | ICD-10-CM | POA: Diagnosis not present

## 2020-06-06 DIAGNOSIS — G4700 Insomnia, unspecified: Secondary | ICD-10-CM | POA: Diagnosis not present

## 2020-06-11 DIAGNOSIS — I110 Hypertensive heart disease with heart failure: Secondary | ICD-10-CM | POA: Diagnosis not present

## 2020-06-11 DIAGNOSIS — I482 Chronic atrial fibrillation, unspecified: Secondary | ICD-10-CM | POA: Diagnosis not present

## 2020-06-11 DIAGNOSIS — I5032 Chronic diastolic (congestive) heart failure: Secondary | ICD-10-CM | POA: Diagnosis not present

## 2020-06-11 DIAGNOSIS — M7121 Synovial cyst of popliteal space [Baker], right knee: Secondary | ICD-10-CM | POA: Diagnosis not present

## 2020-06-11 DIAGNOSIS — F0390 Unspecified dementia without behavioral disturbance: Secondary | ICD-10-CM | POA: Diagnosis not present

## 2020-06-11 DIAGNOSIS — M170 Bilateral primary osteoarthritis of knee: Secondary | ICD-10-CM | POA: Diagnosis not present

## 2020-06-18 DIAGNOSIS — M7121 Synovial cyst of popliteal space [Baker], right knee: Secondary | ICD-10-CM | POA: Diagnosis not present

## 2020-06-18 DIAGNOSIS — F0390 Unspecified dementia without behavioral disturbance: Secondary | ICD-10-CM | POA: Diagnosis not present

## 2020-06-18 DIAGNOSIS — I5032 Chronic diastolic (congestive) heart failure: Secondary | ICD-10-CM | POA: Diagnosis not present

## 2020-06-18 DIAGNOSIS — I110 Hypertensive heart disease with heart failure: Secondary | ICD-10-CM | POA: Diagnosis not present

## 2020-06-18 DIAGNOSIS — I482 Chronic atrial fibrillation, unspecified: Secondary | ICD-10-CM | POA: Diagnosis not present

## 2020-06-18 DIAGNOSIS — M170 Bilateral primary osteoarthritis of knee: Secondary | ICD-10-CM | POA: Diagnosis not present

## 2020-06-24 DIAGNOSIS — M170 Bilateral primary osteoarthritis of knee: Secondary | ICD-10-CM | POA: Diagnosis not present

## 2020-06-24 DIAGNOSIS — F0390 Unspecified dementia without behavioral disturbance: Secondary | ICD-10-CM | POA: Diagnosis not present

## 2020-06-24 DIAGNOSIS — I110 Hypertensive heart disease with heart failure: Secondary | ICD-10-CM | POA: Diagnosis not present

## 2020-06-24 DIAGNOSIS — I482 Chronic atrial fibrillation, unspecified: Secondary | ICD-10-CM | POA: Diagnosis not present

## 2020-06-24 DIAGNOSIS — I5032 Chronic diastolic (congestive) heart failure: Secondary | ICD-10-CM | POA: Diagnosis not present

## 2020-06-24 DIAGNOSIS — M7121 Synovial cyst of popliteal space [Baker], right knee: Secondary | ICD-10-CM | POA: Diagnosis not present

## 2020-06-26 DIAGNOSIS — M170 Bilateral primary osteoarthritis of knee: Secondary | ICD-10-CM | POA: Diagnosis not present

## 2020-06-26 DIAGNOSIS — I5032 Chronic diastolic (congestive) heart failure: Secondary | ICD-10-CM | POA: Diagnosis not present

## 2020-06-26 DIAGNOSIS — I110 Hypertensive heart disease with heart failure: Secondary | ICD-10-CM | POA: Diagnosis not present

## 2020-06-26 DIAGNOSIS — M7121 Synovial cyst of popliteal space [Baker], right knee: Secondary | ICD-10-CM | POA: Diagnosis not present

## 2020-06-26 DIAGNOSIS — F0390 Unspecified dementia without behavioral disturbance: Secondary | ICD-10-CM | POA: Diagnosis not present

## 2020-06-26 DIAGNOSIS — I482 Chronic atrial fibrillation, unspecified: Secondary | ICD-10-CM | POA: Diagnosis not present

## 2020-07-03 ENCOUNTER — Telehealth: Payer: Self-pay

## 2020-07-03 DIAGNOSIS — M7121 Synovial cyst of popliteal space [Baker], right knee: Secondary | ICD-10-CM | POA: Diagnosis not present

## 2020-07-03 DIAGNOSIS — I482 Chronic atrial fibrillation, unspecified: Secondary | ICD-10-CM | POA: Diagnosis not present

## 2020-07-03 DIAGNOSIS — I110 Hypertensive heart disease with heart failure: Secondary | ICD-10-CM | POA: Diagnosis not present

## 2020-07-03 DIAGNOSIS — M170 Bilateral primary osteoarthritis of knee: Secondary | ICD-10-CM | POA: Diagnosis not present

## 2020-07-03 DIAGNOSIS — F0390 Unspecified dementia without behavioral disturbance: Secondary | ICD-10-CM | POA: Diagnosis not present

## 2020-07-03 DIAGNOSIS — I5032 Chronic diastolic (congestive) heart failure: Secondary | ICD-10-CM | POA: Diagnosis not present

## 2020-07-03 NOTE — Telephone Encounter (Signed)
Spoke with patient's caregiver Synetta Fail and scheduled an in-person Palliative Consult for 08/08/20 @ 11AM  COVID screening was negative. No pets in home. Patient lives with her husband.   Consent obtained; updated Outlook/Netsmart/Team List and Epic.  Family is aware they may be receiving a call from NP the day before or day of to confirm appointment.

## 2020-07-04 NOTE — Telephone Encounter (Signed)
Opened in error

## 2020-07-06 DIAGNOSIS — M170 Bilateral primary osteoarthritis of knee: Secondary | ICD-10-CM | POA: Diagnosis not present

## 2020-07-06 DIAGNOSIS — F419 Anxiety disorder, unspecified: Secondary | ICD-10-CM | POA: Diagnosis not present

## 2020-07-06 DIAGNOSIS — Z8673 Personal history of transient ischemic attack (TIA), and cerebral infarction without residual deficits: Secondary | ICD-10-CM | POA: Diagnosis not present

## 2020-07-06 DIAGNOSIS — Z79891 Long term (current) use of opiate analgesic: Secondary | ICD-10-CM | POA: Diagnosis not present

## 2020-07-06 DIAGNOSIS — I5032 Chronic diastolic (congestive) heart failure: Secondary | ICD-10-CM | POA: Diagnosis not present

## 2020-07-06 DIAGNOSIS — E559 Vitamin D deficiency, unspecified: Secondary | ICD-10-CM | POA: Diagnosis not present

## 2020-07-06 DIAGNOSIS — F0390 Unspecified dementia without behavioral disturbance: Secondary | ICD-10-CM | POA: Diagnosis not present

## 2020-07-06 DIAGNOSIS — G4700 Insomnia, unspecified: Secondary | ICD-10-CM | POA: Diagnosis not present

## 2020-07-06 DIAGNOSIS — Z7901 Long term (current) use of anticoagulants: Secondary | ICD-10-CM | POA: Diagnosis not present

## 2020-07-06 DIAGNOSIS — E7800 Pure hypercholesterolemia, unspecified: Secondary | ICD-10-CM | POA: Diagnosis not present

## 2020-07-06 DIAGNOSIS — M7121 Synovial cyst of popliteal space [Baker], right knee: Secondary | ICD-10-CM | POA: Diagnosis not present

## 2020-07-06 DIAGNOSIS — M810 Age-related osteoporosis without current pathological fracture: Secondary | ICD-10-CM | POA: Diagnosis not present

## 2020-07-06 DIAGNOSIS — Z9181 History of falling: Secondary | ICD-10-CM | POA: Diagnosis not present

## 2020-07-06 DIAGNOSIS — M419 Scoliosis, unspecified: Secondary | ICD-10-CM | POA: Diagnosis not present

## 2020-07-06 DIAGNOSIS — Z5181 Encounter for therapeutic drug level monitoring: Secondary | ICD-10-CM | POA: Diagnosis not present

## 2020-07-06 DIAGNOSIS — Z993 Dependence on wheelchair: Secondary | ICD-10-CM | POA: Diagnosis not present

## 2020-07-06 DIAGNOSIS — I110 Hypertensive heart disease with heart failure: Secondary | ICD-10-CM | POA: Diagnosis not present

## 2020-07-06 DIAGNOSIS — I482 Chronic atrial fibrillation, unspecified: Secondary | ICD-10-CM | POA: Diagnosis not present

## 2020-07-08 DIAGNOSIS — M7121 Synovial cyst of popliteal space [Baker], right knee: Secondary | ICD-10-CM | POA: Diagnosis not present

## 2020-07-08 DIAGNOSIS — F0390 Unspecified dementia without behavioral disturbance: Secondary | ICD-10-CM | POA: Diagnosis not present

## 2020-07-08 DIAGNOSIS — I482 Chronic atrial fibrillation, unspecified: Secondary | ICD-10-CM | POA: Diagnosis not present

## 2020-07-08 DIAGNOSIS — I5032 Chronic diastolic (congestive) heart failure: Secondary | ICD-10-CM | POA: Diagnosis not present

## 2020-07-08 DIAGNOSIS — M170 Bilateral primary osteoarthritis of knee: Secondary | ICD-10-CM | POA: Diagnosis not present

## 2020-07-08 DIAGNOSIS — I110 Hypertensive heart disease with heart failure: Secondary | ICD-10-CM | POA: Diagnosis not present

## 2020-07-16 DIAGNOSIS — M7121 Synovial cyst of popliteal space [Baker], right knee: Secondary | ICD-10-CM | POA: Diagnosis not present

## 2020-07-16 DIAGNOSIS — F0390 Unspecified dementia without behavioral disturbance: Secondary | ICD-10-CM | POA: Diagnosis not present

## 2020-07-16 DIAGNOSIS — I110 Hypertensive heart disease with heart failure: Secondary | ICD-10-CM | POA: Diagnosis not present

## 2020-07-16 DIAGNOSIS — I5032 Chronic diastolic (congestive) heart failure: Secondary | ICD-10-CM | POA: Diagnosis not present

## 2020-07-16 DIAGNOSIS — M170 Bilateral primary osteoarthritis of knee: Secondary | ICD-10-CM | POA: Diagnosis not present

## 2020-07-16 DIAGNOSIS — I482 Chronic atrial fibrillation, unspecified: Secondary | ICD-10-CM | POA: Diagnosis not present

## 2020-07-22 DIAGNOSIS — F0390 Unspecified dementia without behavioral disturbance: Secondary | ICD-10-CM | POA: Diagnosis not present

## 2020-07-22 DIAGNOSIS — M7121 Synovial cyst of popliteal space [Baker], right knee: Secondary | ICD-10-CM | POA: Diagnosis not present

## 2020-07-22 DIAGNOSIS — I482 Chronic atrial fibrillation, unspecified: Secondary | ICD-10-CM | POA: Diagnosis not present

## 2020-07-22 DIAGNOSIS — I5032 Chronic diastolic (congestive) heart failure: Secondary | ICD-10-CM | POA: Diagnosis not present

## 2020-07-22 DIAGNOSIS — I110 Hypertensive heart disease with heart failure: Secondary | ICD-10-CM | POA: Diagnosis not present

## 2020-07-22 DIAGNOSIS — M170 Bilateral primary osteoarthritis of knee: Secondary | ICD-10-CM | POA: Diagnosis not present

## 2020-07-30 DIAGNOSIS — I110 Hypertensive heart disease with heart failure: Secondary | ICD-10-CM | POA: Diagnosis not present

## 2020-07-30 DIAGNOSIS — M170 Bilateral primary osteoarthritis of knee: Secondary | ICD-10-CM | POA: Diagnosis not present

## 2020-07-30 DIAGNOSIS — M7121 Synovial cyst of popliteal space [Baker], right knee: Secondary | ICD-10-CM | POA: Diagnosis not present

## 2020-07-30 DIAGNOSIS — I5032 Chronic diastolic (congestive) heart failure: Secondary | ICD-10-CM | POA: Diagnosis not present

## 2020-07-30 DIAGNOSIS — F0390 Unspecified dementia without behavioral disturbance: Secondary | ICD-10-CM | POA: Diagnosis not present

## 2020-07-30 DIAGNOSIS — I482 Chronic atrial fibrillation, unspecified: Secondary | ICD-10-CM | POA: Diagnosis not present

## 2020-08-06 ENCOUNTER — Other Ambulatory Visit: Payer: Self-pay | Admitting: *Deleted

## 2020-08-06 ENCOUNTER — Encounter: Payer: Self-pay | Admitting: Cardiology

## 2020-08-06 ENCOUNTER — Other Ambulatory Visit: Payer: Self-pay

## 2020-08-06 ENCOUNTER — Telehealth (INDEPENDENT_AMBULATORY_CARE_PROVIDER_SITE_OTHER): Payer: Medicare Other | Admitting: Cardiology

## 2020-08-06 VITALS — BP 126/60 | HR 70 | Ht 62.0 in | Wt 175.0 lb

## 2020-08-06 DIAGNOSIS — I482 Chronic atrial fibrillation, unspecified: Secondary | ICD-10-CM | POA: Diagnosis not present

## 2020-08-06 DIAGNOSIS — I480 Paroxysmal atrial fibrillation: Secondary | ICD-10-CM | POA: Diagnosis not present

## 2020-08-06 DIAGNOSIS — I5032 Chronic diastolic (congestive) heart failure: Secondary | ICD-10-CM | POA: Diagnosis not present

## 2020-08-06 NOTE — Patient Instructions (Addendum)
Medication Instructions:   Your physician recommends that you continue on your current medications as directed. Please refer to the Current Medication list given to you today.  Labwork:  BMET-to be drawn by home health-Order faxed to [facility]  Testing/Procedures:  none  Follow-Up:  Your physician recommends that you schedule a follow-up appointment in: 3 months.  Any Other Special Instructions Will Be Listed Below (If Applicable).  If you need a refill on your cardiac medications before your next appointment, please call your pharmacy.

## 2020-08-06 NOTE — Progress Notes (Signed)
Virtual Visit via Telephone Note   This visit type was conducted due to national recommendations for restrictions regarding the COVID-19 Pandemic (e.g. social distancing) in an effort to limit this patient's exposure and mitigate transmission in our community.  Due to her co-morbid illnesses, this patient is at least at moderate risk for complications without adequate follow up.  This format is felt to be most appropriate for this patient at this time.  The patient did not have access to video technology/had technical difficulties with video requiring transitioning to audio format only (telephone).  All issues noted in this document were discussed and addressed.  No physical exam could be performed with this format.  Please refer to the patient's chart for her  consent to telehealth for [facility].    Date:  08/06/2020   ID:  Danielle Brooks, DOB Jan 06, 1929, MRN [id number] The patient was identified using 2 identifiers.  Patient Location: Home Provider Location: Office/Clinic   PCP:  Ignatius Specking, MD   [facility] Providers Cardiologist:  Nona Dell, MD     Evaluation Performed:  Follow-Up Visit  Chief Complaint:   Cardiac follow-up  History of Present Illness:    Danielle Brooks is a 87 y.o. female last assessed via telehealth encounter in March.  We spoke by phone today.  She has not been able to ambulate or get out of her house since prior fall.  She does have PT twice a week and is up in a chair today watching television, although has a hospital bed at home.  Her weight is down as noted below, not retaining any fluid.  She has not had any recent lab work to reassess renal function and potassium on current dose of Lasix.  Vital signs are otherwise stable.  She does not report any palpitations.  She is on Coumadin with follow-up by Dr. Sherril Croon.  Reports recent therapeutic INR, no spontaneous bleeding problems.   Past Medical History:  Diagnosis Date  . Atrial fibrillation (HCC)     Paroxysmal, history of normal LV and negative ischemic workup  . Chronic diastolic heart failure (HCC)    EF 55-60% w/severe pulmonary HTN noted on echo 10/20/16   . Mixed hyperlipidemia   . Osteoporosis   . TIA (transient ischemic attack)   . Vitamin D deficiency    Past Surgical History:  Procedure Laterality Date  . DILATION AND CURETTAGE OF UTERUS  1999     Current Meds  Medication Sig  . ALPRAZolam (XANAX) 0.25 MG tablet Take 0.125 mg by mouth 2 (two) times daily as needed.  Marland Kitchen amiodarone (PACERONE) 200 MG tablet Take 1 tablet (200 mg total) by mouth daily.  Marland Kitchen donepezil (ARICEPT) 5 MG tablet Take 5 mg by mouth daily.  . furosemide (LASIX) 40 MG tablet Take 40 mg by mouth at bedtime.  Marland Kitchen losartan (COZAAR) 25 MG tablet TAKE 1/2 TABLET BY MOUTH DAILY  . potassium chloride SA (KLOR-CON) 20 MEQ tablet Take 20 mEq by mouth at bedtime.  . traZODone (DESYREL) 100 MG tablet Take 100 mg by mouth at bedtime as needed.   . Vitamin D, Ergocalciferol, (DRISDOL) 50000 UNITS CAPS Take 50,000 Units by mouth every Tuesday.   . warfarin (COUMADIN) 5 MG tablet Take 2.5-5 mg by mouth See admin instructions. Take 1 tablet (5mg ) all days except on Wednesday take 1/2 tablet (2.5mg ) Managed by [facility] office  . [DISCONTINUED] furosemide (LASIX) 40 MG tablet Take 1-1.5 tablets (40-60 mg total) by mouth every  other day. (Patient taking differently: Take 40 mg by mouth daily.)  . [DISCONTINUED] potassium chloride SA (KLOR-CON) 20 MEQ tablet TAKE ONE TABLET BY MOUTH EVERY *OTHER* DAY ALTERNATING WITH TWO TABLETS.     Allergies:   Bactrim [sulfamethoxazole-trimethoprim], Codeine, and Novocain [procaine hcl]   ROS: No dizziness or syncope.   Prior CV studies:   The following studies were reviewed today:  Echocardiogram 09/13/2018: 1. The left ventricle has normal systolic function with an ejection fraction of 60-65%. The cavity size was normal. Left ventricular diastolic Doppler parameters are consistent  with pseudonormalization. Elevated mean left atrial pressure. 2. The right ventricle has normal systolic function. The cavity was normal. There is no increase in right ventricular wall thickness. 3. Left atrial size was severely dilated. 4. Right atrial size was mildly dilated. 5. The aortic valve is tricuspid. Moderate thickening of the aortic valve. Moderate calcification of the aortic valve. Aortic valve regurgitation is moderate by color flow Doppler. No stenosis of the aortic valve. Moderate aortic annular calcification  noted. 6. The mitral valve is abnormal. Mild thickening of the mitral valve leaflet. Mild calcification of the mitral valve leaflet. There is mild to moderate mitral annular calcification present. Mitral valve regurgitation is moderate by color flow Doppler. 7. The aortic root is normal in size and structure. 8. Pulmonary hypertension is indeterminant, inadequate TR jet.  Labs/Other Tests and Data Reviewed:    EKG:  An ECG dated 11/22/2019 was personally reviewed today and demonstrated:  Rate controlled atrial fibrillation with low voltage.  Recent Labs:  October 2021: Potassium 4.1, BUN 23, creatinine 1.41  Wt Readings from Last 3 Encounters:  08/06/20 175 lb (79.4 kg)  05/20/20 200 lb (90.7 kg)  01/22/20 216 lb (98 kg)     Objective:    Vital Signs:  BP 126/60   Pulse 70   Ht 5\' 2"  (1.575 m)   Wt 175 lb (79.4 kg)   SpO2 97%   BMI 32.01 kg/m    Patient spoke in complete sentences, not short of breath while talking.  ASSESSMENT & PLAN:    1.  Diastolic heart failure, weight down and symptomatically stable on Lasix with potassium supplement.  Plan to get home health nurse to check a BMET.  Otherwise continue losartan.  2.  Paroxysmal to persistent atrial fibrillation with CHA2DS2-VASc score of 6.  She remains on Coumadin with follow-up by Dr. .   Time:   Today, I have spent 6 minutes with the patient with telehealth technology discussing the  above problems.     Medication Adjustments/Labs and Tests Ordered: Current medicines are reviewed at length with the patient today.  Concerns regarding medicines are outlined above.   Tests Ordered: Orders Placed This Encounter  Procedures  . Basic metabolic panel    Medication Changes: No orders of the defined types were placed in this encounter.   Follow Up:  In Person 3 months.  Signed, Sherril Croon, MD  08/06/2020 2:56 PM    [facility]

## 2020-08-08 ENCOUNTER — Other Ambulatory Visit: Payer: Medicare Other | Admitting: Nurse Practitioner

## 2020-08-08 ENCOUNTER — Other Ambulatory Visit: Payer: Self-pay

## 2020-08-08 VITALS — BP 124/60 | HR 60 | Resp 18

## 2020-08-08 DIAGNOSIS — Z6827 Body mass index (BMI) 27.0-27.9, adult: Secondary | ICD-10-CM | POA: Diagnosis not present

## 2020-08-08 DIAGNOSIS — M79662 Pain in left lower leg: Secondary | ICD-10-CM

## 2020-08-08 DIAGNOSIS — Z7189 Other specified counseling: Secondary | ICD-10-CM | POA: Diagnosis not present

## 2020-08-08 DIAGNOSIS — I5032 Chronic diastolic (congestive) heart failure: Secondary | ICD-10-CM | POA: Diagnosis not present

## 2020-08-08 DIAGNOSIS — Z1331 Encounter for screening for depression: Secondary | ICD-10-CM | POA: Diagnosis not present

## 2020-08-08 DIAGNOSIS — Z515 Encounter for palliative care: Secondary | ICD-10-CM

## 2020-08-08 DIAGNOSIS — Z299 Encounter for prophylactic measures, unspecified: Secondary | ICD-10-CM | POA: Diagnosis not present

## 2020-08-08 DIAGNOSIS — R531 Weakness: Secondary | ICD-10-CM

## 2020-08-08 DIAGNOSIS — Z1339 Encounter for screening examination for other mental health and behavioral disorders: Secondary | ICD-10-CM | POA: Diagnosis not present

## 2020-08-08 DIAGNOSIS — I480 Paroxysmal atrial fibrillation: Secondary | ICD-10-CM | POA: Diagnosis not present

## 2020-08-08 DIAGNOSIS — Z789 Other specified health status: Secondary | ICD-10-CM | POA: Diagnosis not present

## 2020-08-08 DIAGNOSIS — I4891 Unspecified atrial fibrillation: Secondary | ICD-10-CM | POA: Diagnosis not present

## 2020-08-08 DIAGNOSIS — I10 Essential (primary) hypertension: Secondary | ICD-10-CM | POA: Diagnosis not present

## 2020-08-08 DIAGNOSIS — D6869 Other thrombophilia: Secondary | ICD-10-CM | POA: Diagnosis not present

## 2020-08-08 DIAGNOSIS — Z0000 Encounter for general adult medical examination without abnormal findings: Secondary | ICD-10-CM | POA: Diagnosis not present

## 2020-08-08 NOTE — Progress Notes (Signed)
[facility] Consult Note Telephone: [phone]  Fax: [phone]    Date of encounter: 08/08/20 PATIENT NAME: Danielle Brooks [address] [place] [address]   [phone] (home)  DOB: 05/22/1928 MRN: [id number]  PRIMARY CARE PROVIDER:    Glenda Chroman, MD,  [place] [address] [phone]  REFERRING PROVIDER:   Glenda Chroman, MD [address] [place],  [place] [address] [phone]  RESPONSIBLE PARTY:    Contact Information     Name Relation Home Work [place] Spouse [phone]      I met face to face with patient in home. Her caregiver Rodena Piety present during visit. Palliative Care was asked to follow this patient by consultation request of  Glenda Chroman, MD to address advance care planning and complex medical decision making. This is the initial visit.                                   ASSESSMENT AND PLAN / RECOMMENDATIONS:   Advance Care Planning/Goals of Care: Goals include to maximize quality of life and symptom management. Our advance care planning conversation included a discussion about:    The value and importance of advance care planning  Experiences with loved ones who have been seriously ill or have died  Exploration of personal, cultural or spiritual beliefs that might influence medical decisions  Exploration of goals of care in the event of a sudden injury or illness  Review and updating or creation of an  advance directive document . Decision not to resuscitate  CODE STATUS: DNR  Goal of care: Patient's goal of care is comfort while preserving function. Directive: During our discussion today, patient decided to not be resuscitated in the event of cardiac or respiratory arrest. DNR form signed for patient, she was advised to keep form in a readily accessible area in home for easy assess when needed. Signed DNR form uploaded to [facility] EMR.   I spent 30 minutes providing this  consultation. More than 50% of the time in this consultation was spent in counseling and care coordination. ----------------------------------------------------------------------------------- Symptom Management/Plan: Left calf pain: Calf size equal bilaterally, no redness or swelling noted on physical exam, site not tender to touch today. Continue Tylenol as needed, do not take more that 3031m in a 24hr period. May consider dopplers if worsening pain, or if redness or swelling occurs.  Generalized weakness: condition is ongoing, patient is currently non-ambulatory, mostly home bound. Her caregiver ARodena Pietyreport patient discharged from therapy by [facility] due none progression and poor patient participation. Maintain safety, prevent falls. Continue to assist with ADLs as indicated while encouraging patient to do as much as possible for self. Patient uses Urine incontinence suction wick while in bed. Paroxsymal Afib: Patient reported INR as 2.0 this month,c checked by home health nurse, currently on Coumadin 2.555mdaily. Report not being on newer meds due to insurance coverage. Continue current plan of care with Coumadin and follow up with PCP for management.   Follow up Palliative Care Visit: Palliative care will continue to follow for complex medical decision making, advance care planning, and clarification of goals. Return 6 weeks or prn.  PPS: 40%  HOSPICE ELIGIBILITY/DIAGNOSIS: TBD  Chief Complaint: Pain in left calf  History obtained from review of Epic EMR and  discussion with Danielle Brooks and her  care giver Rodena Piety.  HISTORY OF PRESENT ILLNESS:  ILLANA NOLTING is a 87 y.o. year old female  with  multiple medical conditions including chronic diastolic heart failure (EF 60-65% echo 2020), chronic Paroxysmal Afib on coumadin, osteoporosis, hx of TIA. Patient with complaint of left calf pain. Described pain as deep ache that occurs intermittently. Pain not aggravated by movement, relieved by PRN  Tylenol, denied pain today. No report of associated symptom like redness or swelling. Patient on coumadin for Afib, no report of acute bleed, denied fever, denied chills. Patient with generalized weakness in the setting of deconditioning from her comorbid conditions. She is dependent on her personal caregiver for assistance with all her ADLs. Patient is able to feed self. She is unable to self transfer, dependent on wheelchair for ambulation, has a hospital bed. Patient is a retired Marine scientist.   I reviewed available labs, medications, imaging, studies and related documents from the EMR.  Records reviewed and summarized above.   ROS EYES: denies acute vision changes ENMT: denies dysphagia Cardiovascular: denies chest pain, denies DOE Pulmonary: denies cough, denies increased SOB Abdomen: endorses fair appetite, denies constipation, endorses continence of bowel GU: denies dysuria, endorses incontinence of urine MSK: endorse weakness, no falls reported Skin: denies rashes or wounds Neurological: denies pain, denies insomnia Psych: Endorses positive mood Heme/lymph/immuno: denies bruises, denies abnormal bleeding  Physical Exam: Current and past weights: 175lbs, BMI 32kg/m2 General: frail appearing, cooperative, lying in bed in NAD EYES: anicteric sclera, no discharge  ENMT: intact hearing, oral mucous membranes moist CV: S1S2 normal, no LE edema Pulmonary: LCTA, no increased work of breathing, no cough, room air Abdomen: soft and non tender, no ascites GU: deferred MSK: sarcopenia, moves all extremities, non-ambulatory Skin: warm and dry, no rashes or wounds on visible skin Neuro: generalized weakness, no cognitive impairment Psych: non-anxious affect, A and O x 4 Hem/lymph/immuno: no widespread bruising  CURRENT PROBLEM LIST:  Patient Active Problem List   Diagnosis Date Noted   Ventricular tachycardia ([place]) 04/19/2017   Acute on chronic diastolic heart failure (HCC)    PAF (paroxysmal  atrial fibrillation) ([place])    Acute respiratory failure ([place]) 10/23/2016   CHF, acute on chronic (HCC) 10/19/2016   Paroxysmal atrial fibrillation ([place]) 07/29/2011   Essential hypertension 07/29/2011   Hx-TIA (transient ischemic attack) 07/29/2011   PAST MEDICAL HISTORY:  Active Ambulatory Problems    Diagnosis Date Noted   Paroxysmal atrial fibrillation ([place]) 07/29/2011   Essential hypertension 07/29/2011   Hx-TIA (transient ischemic attack) 07/29/2011   CHF, acute on chronic ([place]) 10/19/2016   Acute respiratory failure ([place]) 10/23/2016   Ventricular tachycardia ([place]) 04/19/2017   Acute on chronic diastolic heart failure (HCC)    PAF (paroxysmal atrial fibrillation) ([place])    Resolved Ambulatory Problems    Diagnosis Date Noted   Edema 07/29/2011   Panic attacks 07/29/2011   Insomnia 07/29/2011   Palpitations 07/29/2011   Aortic sclerosis 07/29/2011   Osteoporosis 07/29/2011   Shortness of breath 07/29/2011   Past Medical History:  Diagnosis Date   Atrial fibrillation (HCC)    Chronic diastolic heart failure (HCC)    Mixed hyperlipidemia    TIA (transient ischemic attack)    Vitamin D deficiency    SOCIAL HX:  Social History   Tobacco Use   Smoking status: Never   Smokeless tobacco: Never  Substance Use Topics   Alcohol use: No    Alcohol/week: 0.0 standard drinks   FAMILY HX:  Family History  Problem Relation Age of Onset   Coronary artery disease Father    Alzheimer's disease Mother    Hypertension Sister      ALLERGIES:  Allergies  Allergen Reactions   Bactrim [Sulfamethoxazole-Trimethoprim] Rash   Codeine Itching   Novocain [Procaine Hcl] Other (See Comments)    Sob and passed out years ago at dentist office     PERTINENT MEDICATIONS:  Outpatient Encounter Medications as of 08/08/2020  Medication Sig   ALPRAZolam (XANAX) 0.25 MG tablet Take 0.125 mg by mouth 2 (two) times daily as needed.   amiodarone (PACERONE) 200 MG tablet Take 1 tablet (200 mg  total) by mouth daily.   donepezil (ARICEPT) 5 MG tablet Take 5 mg by mouth daily.   furosemide (LASIX) 40 MG tablet Take 40 mg by mouth at bedtime.   losartan (COZAAR) 25 MG tablet TAKE 1/2 TABLET BY MOUTH DAILY   potassium chloride SA (KLOR-CON) 20 MEQ tablet Take 20 mEq by mouth at bedtime.   traZODone (DESYREL) 100 MG tablet Take 100 mg by mouth at bedtime as needed.    Vitamin D, Ergocalciferol, (DRISDOL) 50000 UNITS CAPS Take 50,000 Units by mouth every Tuesday.    warfarin (COUMADIN) 5 MG tablet Take 2.5-5 mg by mouth See admin instructions. Take 1 tablet (8m) all days except on Wednesday take 1/2 tablet (2.517m Managed by VyWoody Sellerffice   No facility-administered encounter medications on file as of 08/08/2020.   Thank you for the opportunity to participate in the care of Danielle Brooks.  The palliative care team will continue to follow. Please call our office at [phone] we can be of additional assistance.   QuJari FavreDNP, AGPCNP-BC  COVID-19 PATIENT SCREENING TOOL Asked and negative response unless otherwise noted:   Have you had symptoms of covid, tested positive or been in contact with someone with symptoms/positive test in the past 5-10 days?

## 2020-08-13 ENCOUNTER — Telehealth: Payer: Self-pay | Admitting: Cardiology

## 2020-08-13 NOTE — Telephone Encounter (Signed)
Noted Will forward to [facility]

## 2020-08-13 NOTE — Telephone Encounter (Signed)
   Covering for [name] - By review of his last note, her symptoms were stable and weight had also been stable on her home scales. Will continue current medication regimen for now and if she does have labs with her PCP in the interim, we will request a copy as it appears he does check her INR level given she is still on Coumadin.  Signed, Ellsworth Lennox, PA-C 08/13/2020, 2:05 PM Pager: [phone]

## 2020-08-13 NOTE — Telephone Encounter (Signed)
Pt no longer has home health coming out for visits and she's unable to leave the house unless it's through EMS -- she is on palliative care   She has not had her labs done.   Please give pt a call [phone]

## 2020-08-20 ENCOUNTER — Telehealth: Payer: Self-pay | Admitting: Cardiology

## 2020-08-20 NOTE — Telephone Encounter (Signed)
New message    They need someone to come out and draw blood from the patient she is bedridden now.  Patient only gets Pallative care 1x a month, if you can send them an order they can send someone out to draw the blood at the house

## 2020-08-20 NOTE — Telephone Encounter (Signed)
Danielle Brooks that DPR needed to speak to anyone other than patient or husband. Advised that per [place] Ouch, will request labs if done by PCP or any other provider and at this time, her skill nursing visits by home health ended a few weeks ago. Verbalized understanding.

## 2020-08-25 ENCOUNTER — Other Ambulatory Visit: Payer: Self-pay | Admitting: Cardiology

## 2020-09-26 ENCOUNTER — Other Ambulatory Visit: Payer: Medicare Other | Admitting: *Deleted

## 2020-09-26 ENCOUNTER — Other Ambulatory Visit: Payer: Self-pay

## 2020-09-26 ENCOUNTER — Other Ambulatory Visit: Payer: Medicare Other

## 2020-09-26 VITALS — BP 114/80 | HR 77 | Temp 97.6°F | Resp 17

## 2020-09-26 DIAGNOSIS — Z515 Encounter for palliative care: Secondary | ICD-10-CM

## 2020-09-26 NOTE — Progress Notes (Signed)
[facility] COMMUNITY PALLIATIVE CARE RN NOTE  PATIENT NAME: Danielle Brooks DOB: 1928/10/04 MRN: [id number]  PRIMARY CARE PROVIDER: Glenda Chroman, MD  RESPONSIBLE PARTY:  Acct ID - Guarantor Home Phone Work Phone Relationship Acct Type  [id number] Danielle Brooks, Danielle Brooks [phone]  Self P/F     [address] [place], [place] [address]   Covid-19 Pre-screening Negative  PLAN OF CARE and INTERVENTION:  ADVANCE CARE PLANNING/GOALS OF CARE: Goal is for patient to remain at home with her husband.  PATIENT/CAREGIVER EDUCATION: Symptom management, safe transfers DISEASE STATUS: Joint follow-up visit made with palliative care MSW, [facility]. Met with patient in her bedroom. Her husband was present in the living room. She is lying in bed awake and alert and able to engage in appropriate conversation. She denies pain. She does experience some shortness of breath with exertion, but this subsides quickly while at rest. She requires 1-2 person assistance with all ADLs. She is transferred to the bedside commode. She has not ambulated since she had a bad fall 3 months ago. She did have in-home therapy however was discharged due to non-progression. She is able to feed herself independently. She has a good appetite. She has a private caregiver , Rodena Piety, who assists her and her husband 7 days a week for several hours a day. She is intermittently incontinent of both bowel and bladder and wears Depends. She has a Purewick urinary system. Urine is dark yellow. Her bowel movements have been regular and occurs daily. She wishes that she was able to do more for herself, but overall feels her condition is stable. No complaints.   CODE STATUS: DNR ADVANCED DIRECTIVES: Y MOST FORM: no PPS: 30%   PHYSICAL EXAM:   VITALS: Today's Vitals   09/26/20 1204  BP: 114/80  Pulse: 77  Resp: 17  Temp: 97.6 F (36.4 C)  TempSrc: Temporal  SpO2: 92%  PainSc: 0-No pain    LUNGS: clear to auscultation  CARDIAC: Cor irreg,  Afib EXTREMITIES: No edema SKIN:  Exposed skin is dry and intact; denies skin issues   NEURO:  Alert and oriented x 3, hard of hearing, generalized weakness, non-ambulatory   (Duration of visit and documentation 60 minutes)   Daryl Eastern, RN BSN

## 2020-09-26 NOTE — Progress Notes (Signed)
COMMUNITY PALLIATIVE CARE SW NOTE  PATIENT NAME: Danielle Brooks DOB: 1928-12-29 MRN: [id number]  PRIMARY CARE PROVIDER: Ignatius Specking, MD  RESPONSIBLE PARTY:  Acct ID - Guarantor Home Phone Work Phone Relationship Acct Type  [id number] Danielle Brooks, Danielle Brooks [phone]  Self P/F     [address] [place], [place] [address]     PLAN OF CARE and INTERVENTIONS:             GOALS OF CARE/ ADVANCE CARE PLANNING:  Goal is for patient to remain in her home with her husband. Patient is a DNR>  SOCIAL/EMOTIONAL/SPIRITUAL ASSESSMENT/ INTERVENTIONS:  SW and RN-M. Dimas Aguas completed a follow-up visit with patient at her home. She was present wit her husband. Patient was in bed when the team arrived. She was alert and oriented x3, cordial and engaged with the team and at times appeared to be hard of of hearing. Patient reported that she sleeps well and sleeps through the night now that she is taking Nyquil. She reports that she eats well, eating at least 3 meals a day. Patient report that she has not walked since her last fall in March. She is able to stand and pivot. She is dependent for all ADL's, but is able to feed herself. She is able to get up to a bedside commode with assistance. She reports improved breathing, no pain and regular bowel movements. Patient has a private caregiver that comes daily who will get her up, doe personal care and later get her back to bed for evening. Patient requested assistance with getting a bedside table as she enjoys reading and hopes this will help her to be able to read more. Patient has a life alert and uses a pure wick system. 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 Patient verbalized no other concerns. Patient remains open to ongoing palliative/SW support.  PATIENT/CAREGIVER EDUCATION/ COPING:  Patient assured the team that she was doing okay. She report that her day is spent watching TV and reading when she is  able to.  PERSONAL EMERGENCY PLAN:  Patient has life alert system. 911 can abe activated for emergency. Patient has daily contact via private caregivers.  COMMUNITY RESOURCES COORDINATION/ HEALTH CARE NAVIGATION:  Patient has a private caregiver, Danielle Brooks.  FINANCIAL/LEGAL CONCERNS/INTERVENTIONS:  None.     SOCIAL HX:  Social History   Tobacco Use   Smoking status: Never   Smokeless tobacco: Never  Substance Use Topics   Alcohol use: No    Alcohol/week: 0.0 standard drinks    CODE STATUS: DNR ADVANCED DIRECTIVES: Yes MOST FORM COMPLETE:  No HOSPICE EDUCATION PROVIDED: No  PPS: Patient is alert and oriented x3: patient is dependent for all ADL's except feeding.  Duration of visit and documentation: 60 minutes   Clydia Llano, LCSW

## 2020-10-13 ENCOUNTER — Telehealth: Payer: Self-pay

## 2020-10-13 NOTE — Telephone Encounter (Signed)
(  5:25 pm) Palliative care SW returned call to patient's friend/PCG-Danielle Brooks. She was inquiring about bedside table for patient. SW advised her that palliative care does not provide DME, however she could seek a used item if this would help. Danielle Brooks advised that they talked Dr. Iona Coach [phone]), and stated that if the request was sent to his office, he would order the table for patient. SW advised Danielle Brooks that she will forward this request to the palliative care nurse navigator for follow-up, but she should know that there must have been a visit from the PCP in the last 90 days. Danielle Brooks stated that there has been. SW advised that nurse navigator will follow-up.

## 2020-10-16 DIAGNOSIS — Z6827 Body mass index (BMI) 27.0-27.9, adult: Secondary | ICD-10-CM | POA: Diagnosis not present

## 2020-10-16 DIAGNOSIS — Z299 Encounter for prophylactic measures, unspecified: Secondary | ICD-10-CM | POA: Diagnosis not present

## 2020-10-16 DIAGNOSIS — I739 Peripheral vascular disease, unspecified: Secondary | ICD-10-CM | POA: Diagnosis not present

## 2020-10-16 DIAGNOSIS — I4891 Unspecified atrial fibrillation: Secondary | ICD-10-CM | POA: Diagnosis not present

## 2020-10-16 DIAGNOSIS — Z0000 Encounter for general adult medical examination without abnormal findings: Secondary | ICD-10-CM | POA: Diagnosis not present

## 2020-10-16 DIAGNOSIS — Z1331 Encounter for screening for depression: Secondary | ICD-10-CM | POA: Diagnosis not present

## 2020-10-16 DIAGNOSIS — I5032 Chronic diastolic (congestive) heart failure: Secondary | ICD-10-CM | POA: Diagnosis not present

## 2020-10-16 DIAGNOSIS — Z789 Other specified health status: Secondary | ICD-10-CM | POA: Diagnosis not present

## 2020-10-16 DIAGNOSIS — Z7189 Other specified counseling: Secondary | ICD-10-CM | POA: Diagnosis not present

## 2020-10-16 DIAGNOSIS — Z1339 Encounter for screening examination for other mental health and behavioral disorders: Secondary | ICD-10-CM | POA: Diagnosis not present

## 2020-10-16 DIAGNOSIS — I10 Essential (primary) hypertension: Secondary | ICD-10-CM | POA: Diagnosis not present

## 2020-10-16 DIAGNOSIS — G319 Degenerative disease of nervous system, unspecified: Secondary | ICD-10-CM | POA: Diagnosis not present

## 2020-12-04 ENCOUNTER — Telehealth: Payer: Self-pay | Admitting: Cardiology

## 2020-12-04 NOTE — Telephone Encounter (Signed)
  Patient Consent for Virtual Visit   {    CONSENT FOR VIRTUAL VISIT FOR:  Danielle Brooks  By participating in this virtual visit I agree to the following:  I hereby voluntarily request, consent and authorize [facility] and its employed or contracted physicians, physician assistants, nurse practitioners or other licensed health care professionals (the Practitioner), to provide me with telemedicine health care services (the "Services") as deemed necessary by the treating Practitioner. I acknowledge and consent to receive the Services by the Practitioner via telemedicine. I understand that the telemedicine visit will involve communicating with the Practitioner through live audiovisual communication technology and the disclosure of certain medical information by electronic transmission. I acknowledge that I have been given the opportunity to request an in-person assessment or other available alternative prior to the telemedicine visit and am voluntarily participating in the telemedicine visit.  I understand that I have the right to withhold or withdraw my consent to the use of telemedicine in the course of my care at any time, without affecting my right to future care or treatment, and that the Practitioner or I may terminate the telemedicine visit at any time. I understand that I have the right to inspect all information obtained and/or recorded in the course of the telemedicine visit and may receive copies of available information for a reasonable fee.  I understand that some of the potential risks of receiving the Services via telemedicine include:  Delay or interruption in medical evaluation due to technological equipment failure or disruption; Information transmitted may not be sufficient (e.g. poor resolution of images) to allow for appropriate medical decision making by the Practitioner; and/or  In rare instances, security protocols could fail, causing a breach of personal health  information.  Furthermore, I acknowledge that it is my responsibility to provide information about my medical history, conditions and care that is complete and accurate to the best of my ability. I acknowledge that Practitioner's advice, recommendations, and/or decision may be based on factors not within their control, such as incomplete or inaccurate data provided by me or distortions of diagnostic images or specimens that may result from electronic transmissions. I understand that the practice of medicine is not an exact science and that Practitioner makes no warranties or guarantees regarding treatment outcomes. I acknowledge that a copy of this consent can be made available to me via my patient portal [facility] MyChart), or I can request a printed copy by calling the office of [facility].    I understand that my insurance will be billed for this visit.   I have read or had this consent read to me. I understand the contents of this consent, which adequately explains the benefits and risks of the Services being provided via telemedicine.  I have been provided ample opportunity to ask questions regarding this consent and the Services and have had my questions answered to my satisfaction. I give my informed consent for the services to be provided through the use of telemedicine in my medical care

## 2020-12-08 ENCOUNTER — Encounter: Payer: Self-pay | Admitting: Cardiology

## 2020-12-08 ENCOUNTER — Ambulatory Visit (INDEPENDENT_AMBULATORY_CARE_PROVIDER_SITE_OTHER): Payer: Medicare Other | Admitting: Cardiology

## 2020-12-08 VITALS — Ht 62.0 in

## 2020-12-08 DIAGNOSIS — I480 Paroxysmal atrial fibrillation: Secondary | ICD-10-CM

## 2020-12-08 DIAGNOSIS — R0600 Dyspnea, unspecified: Secondary | ICD-10-CM | POA: Diagnosis not present

## 2020-12-08 DIAGNOSIS — R531 Weakness: Secondary | ICD-10-CM | POA: Diagnosis not present

## 2020-12-08 DIAGNOSIS — I5032 Chronic diastolic (congestive) heart failure: Secondary | ICD-10-CM

## 2020-12-08 DIAGNOSIS — M545 Low back pain, unspecified: Secondary | ICD-10-CM | POA: Diagnosis not present

## 2020-12-08 DIAGNOSIS — Z299 Encounter for prophylactic measures, unspecified: Secondary | ICD-10-CM | POA: Diagnosis not present

## 2020-12-08 DIAGNOSIS — F419 Anxiety disorder, unspecified: Secondary | ICD-10-CM | POA: Diagnosis not present

## 2020-12-08 NOTE — Patient Instructions (Addendum)
Medication Instructions:  Your physician recommends that you continue on your current medications as directed. Please refer to the Current Medication list given to you today.  Labwork: none  Testing/Procedures: none  Follow-Up: Your physician recommends that you schedule a follow-up appointment in: 3 months (phone)  Any Other Special Instructions Will Be Listed Below (If Applicable).  If you need a refill on your cardiac medications before your next appointment, please call your pharmacy.

## 2020-12-08 NOTE — Progress Notes (Signed)
Virtual Visit via Telephone Note   This visit type was conducted due to national recommendations for restrictions regarding the COVID-19 Pandemic (e.g. social distancing) in an effort to limit this patient's exposure and mitigate transmission in our community.  Due to her co-morbid illnesses, this patient is at least at moderate risk for complications without adequate follow up.  This format is felt to be most appropriate for this patient at this time.  The patient did not have access to video technology/had technical difficulties with video requiring transitioning to audio format only (telephone).  All issues noted in this document were discussed and addressed.  No physical exam could be performed with this format.  Please refer to the patient's chart for her  consent to telehealth for [facility].    Date:  12/08/2020   ID:  Danielle Brooks, DOB [birth year], MRN [id number] The patient was identified using 2 identifiers.  Patient Location: Home Provider Location: Office/Clinic   PCP:  Ignatius Specking, MD   [facility] Providers Cardiologist:  Nona Dell, MD {  Evaluation Performed:  Follow-Up Visit  Chief Complaint: Cardiac follow-up  History of Present Illness:    Danielle Brooks is a 87 y.o. female last assessed via telehealth encounter in June.  We spoke by phone today.  She is essentially bedbound at this point, has a hospital bed at home.  Coumadin is still followed by Dr. Sherril Croon, palliative care comes in to draw her blood work.  She is overdue for recheck this month however.  She is unable to stand to get a weight.  States that leg swelling has been adequately controlled on current dose of Lasix.  She does not report any sense of palpitations.  Does have an intermittent cough.  Dates that last time blood pressure was checked it was in the 120s to 130s systolic.  I reviewed her medications which are noted below.   Past Medical History:  Diagnosis Date   Atrial fibrillation  (HCC)    Paroxysmal, history of normal LV and negative ischemic workup   Chronic diastolic heart failure (HCC)    EF 55-60% w/severe pulmonary HTN noted on echo 10/20/16    Mixed hyperlipidemia    Osteoporosis    TIA (transient ischemic attack)    Vitamin D deficiency    Past Surgical History:  Procedure Laterality Date   DILATION AND CURETTAGE OF UTERUS  1999     Current Meds  Medication Sig   ALPRAZolam (XANAX) 0.25 MG tablet Take 0.25 mg by mouth at bedtime.   amiodarone (PACERONE) 200 MG tablet TAKE ONE TABLET BY MOUTH ONCE DAILY   donepezil (ARICEPT) 5 MG tablet Take 5 mg by mouth daily.   furosemide (LASIX) 40 MG tablet Take 40 mg by mouth at bedtime.   losartan (COZAAR) 25 MG tablet TAKE 1/2 TABLET BY MOUTH DAILY   potassium chloride SA (KLOR-CON) 20 MEQ tablet Take 20 mEq by mouth at bedtime.   traZODone (DESYREL) 100 MG tablet Take 100 mg by mouth at bedtime as needed.    Vitamin D, Ergocalciferol, (DRISDOL) 50000 UNITS CAPS Take 50,000 Units by mouth every Tuesday.    warfarin (COUMADIN) 5 MG tablet Take 2.5-5 mg by mouth See admin instructions. Take 1 tablet (5mg ) all days except on Wednesday take 1/2 tablet (2.5mg ) Managed by [facility] office     Allergies:   Bactrim [sulfamethoxazole-trimethoprim], Codeine, and Novocain [procaine hcl]   ROS: No syncope.  Prior CV studies:   The following  studies were reviewed today:  Echocardiogram 09/13/2018:  1. The left ventricle has normal systolic function with an ejection fraction of 60-65%. The cavity size was normal. Left ventricular diastolic Doppler parameters are consistent with pseudonormalization. Elevated mean left atrial pressure.  2. The right ventricle has normal systolic function. The cavity was normal. There is no increase in right ventricular wall thickness.  3. Left atrial size was severely dilated.  4. Right atrial size was mildly dilated.  5. The aortic valve is tricuspid. Moderate thickening of the aortic valve.  Moderate calcification of the aortic valve. Aortic valve regurgitation is moderate by color flow Doppler. No stenosis of the aortic valve. Moderate aortic annular calcification  noted.  6. The mitral valve is abnormal. Mild thickening of the mitral valve leaflet. Mild calcification of the mitral valve leaflet. There is mild to moderate mitral annular calcification present. Mitral valve regurgitation is moderate by color flow Doppler.  7. The aortic root is normal in size and structure.  8. Pulmonary hypertension is indeterminant, inadequate TR jet.  Labs/Other Tests and Data Reviewed:    EKG:  An ECG dated 11/22/2019 was personally reviewed today and demonstrated:  Atrial fibrillation with low voltage.  Recent Labs:  No interval lab work for review.  Wt Readings from Last 3 Encounters:  08/06/20 175 lb (79.4 kg)  05/20/20 200 lb (90.7 kg)  01/22/20 216 lb (98 kg)     Objective:    Vital Signs:  Ht 5\' 2"  (1.575 m)   BMI 32.01 kg/m    Patient spoke without obvious shortness of breath on the phone.  ASSESSMENT & PLAN:    1.  Paroxysmal to persistent atrial fibrillation with CHA2DS2-VASc score of 6.  She remains on Coumadin with followed by Dr. , caregiver will check with palliative care regarding next blood draw for PT/INR.  She does not report any palpitations and otherwise remains on low-dose amiodarone for rhythm suppression.  2.  Chronic diastolic heart failure.  Continue current dose of Lasix with potassium supplement.  Time:   Today, I have spent 8 minutes with the patient with telehealth technology discussing the above problems.     Medication Adjustments/Labs and Tests Ordered: Current medicines are reviewed at length with the patient today.  Concerns regarding medicines are outlined above.   Tests Ordered: No orders of the defined types were placed in this encounter.   Medication Changes: No orders of the defined types were placed in this encounter.   Follow  Up:  Virtual Visit   3 months.  Signed, Sherril Croon, MD  12/08/2020 4:28 PM    [facility]

## 2020-12-11 ENCOUNTER — Other Ambulatory Visit: Payer: Self-pay | Admitting: Cardiology

## 2020-12-12 DIAGNOSIS — F0390 Unspecified dementia without behavioral disturbance: Secondary | ICD-10-CM | POA: Diagnosis not present

## 2020-12-12 DIAGNOSIS — R531 Weakness: Secondary | ICD-10-CM | POA: Diagnosis not present

## 2020-12-12 DIAGNOSIS — Z299 Encounter for prophylactic measures, unspecified: Secondary | ICD-10-CM | POA: Diagnosis not present

## 2020-12-12 DIAGNOSIS — E785 Hyperlipidemia, unspecified: Secondary | ICD-10-CM | POA: Diagnosis not present

## 2020-12-12 DIAGNOSIS — I4891 Unspecified atrial fibrillation: Secondary | ICD-10-CM | POA: Diagnosis not present

## 2020-12-12 DIAGNOSIS — E569 Vitamin deficiency, unspecified: Secondary | ICD-10-CM | POA: Diagnosis not present

## 2020-12-12 DIAGNOSIS — I10 Essential (primary) hypertension: Secondary | ICD-10-CM | POA: Diagnosis not present

## 2020-12-12 DIAGNOSIS — N3946 Mixed incontinence: Secondary | ICD-10-CM | POA: Diagnosis not present

## 2020-12-12 DIAGNOSIS — R0602 Shortness of breath: Secondary | ICD-10-CM | POA: Diagnosis not present

## 2020-12-12 DIAGNOSIS — I5030 Unspecified diastolic (congestive) heart failure: Secondary | ICD-10-CM | POA: Diagnosis not present

## 2020-12-12 DIAGNOSIS — I5032 Chronic diastolic (congestive) heart failure: Secondary | ICD-10-CM | POA: Diagnosis not present

## 2020-12-12 DIAGNOSIS — I509 Heart failure, unspecified: Secondary | ICD-10-CM | POA: Diagnosis not present

## 2020-12-16 DIAGNOSIS — N3946 Mixed incontinence: Secondary | ICD-10-CM | POA: Diagnosis not present

## 2020-12-16 DIAGNOSIS — E785 Hyperlipidemia, unspecified: Secondary | ICD-10-CM | POA: Diagnosis not present

## 2020-12-16 DIAGNOSIS — I509 Heart failure, unspecified: Secondary | ICD-10-CM | POA: Diagnosis not present

## 2020-12-16 DIAGNOSIS — R0602 Shortness of breath: Secondary | ICD-10-CM | POA: Diagnosis not present

## 2020-12-16 DIAGNOSIS — E569 Vitamin deficiency, unspecified: Secondary | ICD-10-CM | POA: Diagnosis not present

## 2020-12-16 DIAGNOSIS — I4891 Unspecified atrial fibrillation: Secondary | ICD-10-CM | POA: Diagnosis not present

## 2020-12-18 DIAGNOSIS — E785 Hyperlipidemia, unspecified: Secondary | ICD-10-CM | POA: Diagnosis not present

## 2020-12-18 DIAGNOSIS — R0602 Shortness of breath: Secondary | ICD-10-CM | POA: Diagnosis not present

## 2020-12-18 DIAGNOSIS — I4891 Unspecified atrial fibrillation: Secondary | ICD-10-CM | POA: Diagnosis not present

## 2020-12-18 DIAGNOSIS — I509 Heart failure, unspecified: Secondary | ICD-10-CM | POA: Diagnosis not present

## 2020-12-18 DIAGNOSIS — N3946 Mixed incontinence: Secondary | ICD-10-CM | POA: Diagnosis not present

## 2020-12-18 DIAGNOSIS — E569 Vitamin deficiency, unspecified: Secondary | ICD-10-CM | POA: Diagnosis not present

## 2020-12-19 DIAGNOSIS — I509 Heart failure, unspecified: Secondary | ICD-10-CM | POA: Diagnosis not present

## 2020-12-19 DIAGNOSIS — N3946 Mixed incontinence: Secondary | ICD-10-CM | POA: Diagnosis not present

## 2020-12-19 DIAGNOSIS — R0602 Shortness of breath: Secondary | ICD-10-CM | POA: Diagnosis not present

## 2020-12-19 DIAGNOSIS — E785 Hyperlipidemia, unspecified: Secondary | ICD-10-CM | POA: Diagnosis not present

## 2020-12-19 DIAGNOSIS — E569 Vitamin deficiency, unspecified: Secondary | ICD-10-CM | POA: Diagnosis not present

## 2020-12-19 DIAGNOSIS — I4891 Unspecified atrial fibrillation: Secondary | ICD-10-CM | POA: Diagnosis not present

## 2020-12-23 DIAGNOSIS — I4891 Unspecified atrial fibrillation: Secondary | ICD-10-CM | POA: Diagnosis not present

## 2020-12-23 DIAGNOSIS — E785 Hyperlipidemia, unspecified: Secondary | ICD-10-CM | POA: Diagnosis not present

## 2020-12-23 DIAGNOSIS — R0602 Shortness of breath: Secondary | ICD-10-CM | POA: Diagnosis not present

## 2020-12-23 DIAGNOSIS — I509 Heart failure, unspecified: Secondary | ICD-10-CM | POA: Diagnosis not present

## 2020-12-23 DIAGNOSIS — E569 Vitamin deficiency, unspecified: Secondary | ICD-10-CM | POA: Diagnosis not present

## 2020-12-23 DIAGNOSIS — N3946 Mixed incontinence: Secondary | ICD-10-CM | POA: Diagnosis not present

## 2020-12-24 DIAGNOSIS — R0602 Shortness of breath: Secondary | ICD-10-CM | POA: Diagnosis not present

## 2020-12-24 DIAGNOSIS — I509 Heart failure, unspecified: Secondary | ICD-10-CM | POA: Diagnosis not present

## 2020-12-24 DIAGNOSIS — I4891 Unspecified atrial fibrillation: Secondary | ICD-10-CM | POA: Diagnosis not present

## 2020-12-24 DIAGNOSIS — N3946 Mixed incontinence: Secondary | ICD-10-CM | POA: Diagnosis not present

## 2020-12-24 DIAGNOSIS — E569 Vitamin deficiency, unspecified: Secondary | ICD-10-CM | POA: Diagnosis not present

## 2020-12-24 DIAGNOSIS — E785 Hyperlipidemia, unspecified: Secondary | ICD-10-CM | POA: Diagnosis not present

## 2020-12-30 DIAGNOSIS — N3946 Mixed incontinence: Secondary | ICD-10-CM | POA: Diagnosis not present

## 2020-12-30 DIAGNOSIS — I509 Heart failure, unspecified: Secondary | ICD-10-CM | POA: Diagnosis not present

## 2020-12-30 DIAGNOSIS — E569 Vitamin deficiency, unspecified: Secondary | ICD-10-CM | POA: Diagnosis not present

## 2020-12-30 DIAGNOSIS — E785 Hyperlipidemia, unspecified: Secondary | ICD-10-CM | POA: Diagnosis not present

## 2020-12-30 DIAGNOSIS — I4891 Unspecified atrial fibrillation: Secondary | ICD-10-CM | POA: Diagnosis not present

## 2020-12-30 DIAGNOSIS — R531 Weakness: Secondary | ICD-10-CM | POA: Diagnosis not present

## 2020-12-30 DIAGNOSIS — R0602 Shortness of breath: Secondary | ICD-10-CM | POA: Diagnosis not present

## 2020-12-31 DIAGNOSIS — E569 Vitamin deficiency, unspecified: Secondary | ICD-10-CM | POA: Diagnosis not present

## 2020-12-31 DIAGNOSIS — I4891 Unspecified atrial fibrillation: Secondary | ICD-10-CM | POA: Diagnosis not present

## 2020-12-31 DIAGNOSIS — N3946 Mixed incontinence: Secondary | ICD-10-CM | POA: Diagnosis not present

## 2020-12-31 DIAGNOSIS — I509 Heart failure, unspecified: Secondary | ICD-10-CM | POA: Diagnosis not present

## 2020-12-31 DIAGNOSIS — R0602 Shortness of breath: Secondary | ICD-10-CM | POA: Diagnosis not present

## 2020-12-31 DIAGNOSIS — E785 Hyperlipidemia, unspecified: Secondary | ICD-10-CM | POA: Diagnosis not present

## 2021-01-01 DIAGNOSIS — I4891 Unspecified atrial fibrillation: Secondary | ICD-10-CM | POA: Diagnosis not present

## 2021-01-01 DIAGNOSIS — I509 Heart failure, unspecified: Secondary | ICD-10-CM | POA: Diagnosis not present

## 2021-01-01 DIAGNOSIS — E785 Hyperlipidemia, unspecified: Secondary | ICD-10-CM | POA: Diagnosis not present

## 2021-01-01 DIAGNOSIS — R0602 Shortness of breath: Secondary | ICD-10-CM | POA: Diagnosis not present

## 2021-01-01 DIAGNOSIS — E569 Vitamin deficiency, unspecified: Secondary | ICD-10-CM | POA: Diagnosis not present

## 2021-01-01 DIAGNOSIS — N3946 Mixed incontinence: Secondary | ICD-10-CM | POA: Diagnosis not present

## 2021-01-06 DIAGNOSIS — E569 Vitamin deficiency, unspecified: Secondary | ICD-10-CM | POA: Diagnosis not present

## 2021-01-06 DIAGNOSIS — I4891 Unspecified atrial fibrillation: Secondary | ICD-10-CM | POA: Diagnosis not present

## 2021-01-06 DIAGNOSIS — N3946 Mixed incontinence: Secondary | ICD-10-CM | POA: Diagnosis not present

## 2021-01-06 DIAGNOSIS — E785 Hyperlipidemia, unspecified: Secondary | ICD-10-CM | POA: Diagnosis not present

## 2021-01-06 DIAGNOSIS — R0602 Shortness of breath: Secondary | ICD-10-CM | POA: Diagnosis not present

## 2021-01-06 DIAGNOSIS — I509 Heart failure, unspecified: Secondary | ICD-10-CM | POA: Diagnosis not present

## 2021-01-08 DIAGNOSIS — I509 Heart failure, unspecified: Secondary | ICD-10-CM | POA: Diagnosis not present

## 2021-01-08 DIAGNOSIS — N3946 Mixed incontinence: Secondary | ICD-10-CM | POA: Diagnosis not present

## 2021-01-08 DIAGNOSIS — R0602 Shortness of breath: Secondary | ICD-10-CM | POA: Diagnosis not present

## 2021-01-08 DIAGNOSIS — I4891 Unspecified atrial fibrillation: Secondary | ICD-10-CM | POA: Diagnosis not present

## 2021-01-08 DIAGNOSIS — E569 Vitamin deficiency, unspecified: Secondary | ICD-10-CM | POA: Diagnosis not present

## 2021-01-08 DIAGNOSIS — E785 Hyperlipidemia, unspecified: Secondary | ICD-10-CM | POA: Diagnosis not present

## 2021-01-13 DIAGNOSIS — I4891 Unspecified atrial fibrillation: Secondary | ICD-10-CM | POA: Diagnosis not present

## 2021-01-13 DIAGNOSIS — E569 Vitamin deficiency, unspecified: Secondary | ICD-10-CM | POA: Diagnosis not present

## 2021-01-13 DIAGNOSIS — E785 Hyperlipidemia, unspecified: Secondary | ICD-10-CM | POA: Diagnosis not present

## 2021-01-13 DIAGNOSIS — N3946 Mixed incontinence: Secondary | ICD-10-CM | POA: Diagnosis not present

## 2021-01-13 DIAGNOSIS — I509 Heart failure, unspecified: Secondary | ICD-10-CM | POA: Diagnosis not present

## 2021-01-13 DIAGNOSIS — R0602 Shortness of breath: Secondary | ICD-10-CM | POA: Diagnosis not present

## 2021-01-20 DIAGNOSIS — E569 Vitamin deficiency, unspecified: Secondary | ICD-10-CM | POA: Diagnosis not present

## 2021-01-20 DIAGNOSIS — R0602 Shortness of breath: Secondary | ICD-10-CM | POA: Diagnosis not present

## 2021-01-20 DIAGNOSIS — N3946 Mixed incontinence: Secondary | ICD-10-CM | POA: Diagnosis not present

## 2021-01-20 DIAGNOSIS — I509 Heart failure, unspecified: Secondary | ICD-10-CM | POA: Diagnosis not present

## 2021-01-20 DIAGNOSIS — I4891 Unspecified atrial fibrillation: Secondary | ICD-10-CM | POA: Diagnosis not present

## 2021-01-20 DIAGNOSIS — E785 Hyperlipidemia, unspecified: Secondary | ICD-10-CM | POA: Diagnosis not present

## 2021-01-21 DIAGNOSIS — R0602 Shortness of breath: Secondary | ICD-10-CM | POA: Diagnosis not present

## 2021-01-21 DIAGNOSIS — I4891 Unspecified atrial fibrillation: Secondary | ICD-10-CM | POA: Diagnosis not present

## 2021-01-21 DIAGNOSIS — N3946 Mixed incontinence: Secondary | ICD-10-CM | POA: Diagnosis not present

## 2021-01-21 DIAGNOSIS — E785 Hyperlipidemia, unspecified: Secondary | ICD-10-CM | POA: Diagnosis not present

## 2021-01-21 DIAGNOSIS — E569 Vitamin deficiency, unspecified: Secondary | ICD-10-CM | POA: Diagnosis not present

## 2021-01-21 DIAGNOSIS — I509 Heart failure, unspecified: Secondary | ICD-10-CM | POA: Diagnosis not present

## 2021-01-27 DIAGNOSIS — E569 Vitamin deficiency, unspecified: Secondary | ICD-10-CM | POA: Diagnosis not present

## 2021-01-27 DIAGNOSIS — I4891 Unspecified atrial fibrillation: Secondary | ICD-10-CM | POA: Diagnosis not present

## 2021-01-27 DIAGNOSIS — R0602 Shortness of breath: Secondary | ICD-10-CM | POA: Diagnosis not present

## 2021-01-27 DIAGNOSIS — E785 Hyperlipidemia, unspecified: Secondary | ICD-10-CM | POA: Diagnosis not present

## 2021-01-27 DIAGNOSIS — I509 Heart failure, unspecified: Secondary | ICD-10-CM | POA: Diagnosis not present

## 2021-01-27 DIAGNOSIS — N3946 Mixed incontinence: Secondary | ICD-10-CM | POA: Diagnosis not present

## 2021-01-29 DIAGNOSIS — I509 Heart failure, unspecified: Secondary | ICD-10-CM | POA: Diagnosis not present

## 2021-01-29 DIAGNOSIS — N3946 Mixed incontinence: Secondary | ICD-10-CM | POA: Diagnosis not present

## 2021-01-29 DIAGNOSIS — E569 Vitamin deficiency, unspecified: Secondary | ICD-10-CM | POA: Diagnosis not present

## 2021-01-29 DIAGNOSIS — R0602 Shortness of breath: Secondary | ICD-10-CM | POA: Diagnosis not present

## 2021-01-29 DIAGNOSIS — R531 Weakness: Secondary | ICD-10-CM | POA: Diagnosis not present

## 2021-01-29 DIAGNOSIS — I4891 Unspecified atrial fibrillation: Secondary | ICD-10-CM | POA: Diagnosis not present

## 2021-01-29 DIAGNOSIS — E785 Hyperlipidemia, unspecified: Secondary | ICD-10-CM | POA: Diagnosis not present

## 2021-02-03 DIAGNOSIS — E569 Vitamin deficiency, unspecified: Secondary | ICD-10-CM | POA: Diagnosis not present

## 2021-02-03 DIAGNOSIS — E785 Hyperlipidemia, unspecified: Secondary | ICD-10-CM | POA: Diagnosis not present

## 2021-02-03 DIAGNOSIS — N3946 Mixed incontinence: Secondary | ICD-10-CM | POA: Diagnosis not present

## 2021-02-03 DIAGNOSIS — I509 Heart failure, unspecified: Secondary | ICD-10-CM | POA: Diagnosis not present

## 2021-02-03 DIAGNOSIS — I4891 Unspecified atrial fibrillation: Secondary | ICD-10-CM | POA: Diagnosis not present

## 2021-02-03 DIAGNOSIS — R0602 Shortness of breath: Secondary | ICD-10-CM | POA: Diagnosis not present

## 2021-02-09 DIAGNOSIS — N3946 Mixed incontinence: Secondary | ICD-10-CM | POA: Diagnosis not present

## 2021-02-09 DIAGNOSIS — E785 Hyperlipidemia, unspecified: Secondary | ICD-10-CM | POA: Diagnosis not present

## 2021-02-09 DIAGNOSIS — R0602 Shortness of breath: Secondary | ICD-10-CM | POA: Diagnosis not present

## 2021-02-09 DIAGNOSIS — I509 Heart failure, unspecified: Secondary | ICD-10-CM | POA: Diagnosis not present

## 2021-02-09 DIAGNOSIS — E569 Vitamin deficiency, unspecified: Secondary | ICD-10-CM | POA: Diagnosis not present

## 2021-02-09 DIAGNOSIS — I4891 Unspecified atrial fibrillation: Secondary | ICD-10-CM | POA: Diagnosis not present

## 2021-02-10 DIAGNOSIS — I4891 Unspecified atrial fibrillation: Secondary | ICD-10-CM | POA: Diagnosis not present

## 2021-02-10 DIAGNOSIS — R0602 Shortness of breath: Secondary | ICD-10-CM | POA: Diagnosis not present

## 2021-02-10 DIAGNOSIS — E569 Vitamin deficiency, unspecified: Secondary | ICD-10-CM | POA: Diagnosis not present

## 2021-02-10 DIAGNOSIS — E785 Hyperlipidemia, unspecified: Secondary | ICD-10-CM | POA: Diagnosis not present

## 2021-02-10 DIAGNOSIS — I509 Heart failure, unspecified: Secondary | ICD-10-CM | POA: Diagnosis not present

## 2021-02-10 DIAGNOSIS — N3946 Mixed incontinence: Secondary | ICD-10-CM | POA: Diagnosis not present

## 2021-02-13 DIAGNOSIS — I509 Heart failure, unspecified: Secondary | ICD-10-CM | POA: Diagnosis not present

## 2021-02-13 DIAGNOSIS — N3946 Mixed incontinence: Secondary | ICD-10-CM | POA: Diagnosis not present

## 2021-02-13 DIAGNOSIS — R0602 Shortness of breath: Secondary | ICD-10-CM | POA: Diagnosis not present

## 2021-02-13 DIAGNOSIS — E569 Vitamin deficiency, unspecified: Secondary | ICD-10-CM | POA: Diagnosis not present

## 2021-02-13 DIAGNOSIS — E785 Hyperlipidemia, unspecified: Secondary | ICD-10-CM | POA: Diagnosis not present

## 2021-02-13 DIAGNOSIS — I4891 Unspecified atrial fibrillation: Secondary | ICD-10-CM | POA: Diagnosis not present

## 2021-02-16 DIAGNOSIS — I4891 Unspecified atrial fibrillation: Secondary | ICD-10-CM | POA: Diagnosis not present

## 2021-02-16 DIAGNOSIS — E785 Hyperlipidemia, unspecified: Secondary | ICD-10-CM | POA: Diagnosis not present

## 2021-02-16 DIAGNOSIS — N3946 Mixed incontinence: Secondary | ICD-10-CM | POA: Diagnosis not present

## 2021-02-16 DIAGNOSIS — I509 Heart failure, unspecified: Secondary | ICD-10-CM | POA: Diagnosis not present

## 2021-02-16 DIAGNOSIS — R0602 Shortness of breath: Secondary | ICD-10-CM | POA: Diagnosis not present

## 2021-02-16 DIAGNOSIS — E569 Vitamin deficiency, unspecified: Secondary | ICD-10-CM | POA: Diagnosis not present

## 2021-02-17 DIAGNOSIS — I509 Heart failure, unspecified: Secondary | ICD-10-CM | POA: Diagnosis not present

## 2021-02-17 DIAGNOSIS — E785 Hyperlipidemia, unspecified: Secondary | ICD-10-CM | POA: Diagnosis not present

## 2021-02-17 DIAGNOSIS — E569 Vitamin deficiency, unspecified: Secondary | ICD-10-CM | POA: Diagnosis not present

## 2021-02-17 DIAGNOSIS — N3946 Mixed incontinence: Secondary | ICD-10-CM | POA: Diagnosis not present

## 2021-02-17 DIAGNOSIS — R0602 Shortness of breath: Secondary | ICD-10-CM | POA: Diagnosis not present

## 2021-02-17 DIAGNOSIS — I4891 Unspecified atrial fibrillation: Secondary | ICD-10-CM | POA: Diagnosis not present

## 2021-02-18 DIAGNOSIS — I509 Heart failure, unspecified: Secondary | ICD-10-CM | POA: Diagnosis not present

## 2021-02-18 DIAGNOSIS — I4891 Unspecified atrial fibrillation: Secondary | ICD-10-CM | POA: Diagnosis not present

## 2021-02-18 DIAGNOSIS — E569 Vitamin deficiency, unspecified: Secondary | ICD-10-CM | POA: Diagnosis not present

## 2021-02-18 DIAGNOSIS — E785 Hyperlipidemia, unspecified: Secondary | ICD-10-CM | POA: Diagnosis not present

## 2021-02-18 DIAGNOSIS — R0602 Shortness of breath: Secondary | ICD-10-CM | POA: Diagnosis not present

## 2021-02-18 DIAGNOSIS — N3946 Mixed incontinence: Secondary | ICD-10-CM | POA: Diagnosis not present

## 2021-02-20 DIAGNOSIS — E785 Hyperlipidemia, unspecified: Secondary | ICD-10-CM | POA: Diagnosis not present

## 2021-02-20 DIAGNOSIS — I4891 Unspecified atrial fibrillation: Secondary | ICD-10-CM | POA: Diagnosis not present

## 2021-02-20 DIAGNOSIS — E569 Vitamin deficiency, unspecified: Secondary | ICD-10-CM | POA: Diagnosis not present

## 2021-02-20 DIAGNOSIS — N3946 Mixed incontinence: Secondary | ICD-10-CM | POA: Diagnosis not present

## 2021-02-20 DIAGNOSIS — R0602 Shortness of breath: Secondary | ICD-10-CM | POA: Diagnosis not present

## 2021-02-20 DIAGNOSIS — I509 Heart failure, unspecified: Secondary | ICD-10-CM | POA: Diagnosis not present

## 2021-02-25 DIAGNOSIS — R0602 Shortness of breath: Secondary | ICD-10-CM | POA: Diagnosis not present

## 2021-02-25 DIAGNOSIS — E569 Vitamin deficiency, unspecified: Secondary | ICD-10-CM | POA: Diagnosis not present

## 2021-02-25 DIAGNOSIS — I509 Heart failure, unspecified: Secondary | ICD-10-CM | POA: Diagnosis not present

## 2021-02-25 DIAGNOSIS — N3946 Mixed incontinence: Secondary | ICD-10-CM | POA: Diagnosis not present

## 2021-02-25 DIAGNOSIS — E785 Hyperlipidemia, unspecified: Secondary | ICD-10-CM | POA: Diagnosis not present

## 2021-02-25 DIAGNOSIS — I4891 Unspecified atrial fibrillation: Secondary | ICD-10-CM | POA: Diagnosis not present

## 2021-02-27 DIAGNOSIS — E785 Hyperlipidemia, unspecified: Secondary | ICD-10-CM | POA: Diagnosis not present

## 2021-02-27 DIAGNOSIS — N3946 Mixed incontinence: Secondary | ICD-10-CM | POA: Diagnosis not present

## 2021-02-27 DIAGNOSIS — I509 Heart failure, unspecified: Secondary | ICD-10-CM | POA: Diagnosis not present

## 2021-02-27 DIAGNOSIS — E569 Vitamin deficiency, unspecified: Secondary | ICD-10-CM | POA: Diagnosis not present

## 2021-02-27 DIAGNOSIS — R0602 Shortness of breath: Secondary | ICD-10-CM | POA: Diagnosis not present

## 2021-02-27 DIAGNOSIS — I4891 Unspecified atrial fibrillation: Secondary | ICD-10-CM | POA: Diagnosis not present

## 2021-03-01 DIAGNOSIS — I4891 Unspecified atrial fibrillation: Secondary | ICD-10-CM | POA: Diagnosis not present

## 2021-03-01 DIAGNOSIS — F411 Generalized anxiety disorder: Secondary | ICD-10-CM | POA: Diagnosis not present

## 2021-03-01 DIAGNOSIS — G311 Senile degeneration of brain, not elsewhere classified: Secondary | ICD-10-CM | POA: Diagnosis not present

## 2021-03-01 DIAGNOSIS — R531 Weakness: Secondary | ICD-10-CM | POA: Diagnosis not present

## 2021-03-01 DIAGNOSIS — K5900 Constipation, unspecified: Secondary | ICD-10-CM | POA: Diagnosis not present

## 2021-03-01 DIAGNOSIS — N3946 Mixed incontinence: Secondary | ICD-10-CM | POA: Diagnosis not present

## 2021-03-01 DIAGNOSIS — I10 Essential (primary) hypertension: Secondary | ICD-10-CM | POA: Diagnosis not present

## 2021-03-01 DIAGNOSIS — E569 Vitamin deficiency, unspecified: Secondary | ICD-10-CM | POA: Diagnosis not present

## 2021-03-01 DIAGNOSIS — E785 Hyperlipidemia, unspecified: Secondary | ICD-10-CM | POA: Diagnosis not present

## 2021-03-01 DIAGNOSIS — I509 Heart failure, unspecified: Secondary | ICD-10-CM | POA: Diagnosis not present

## 2021-03-01 DIAGNOSIS — R0602 Shortness of breath: Secondary | ICD-10-CM | POA: Diagnosis not present

## 2021-03-04 DIAGNOSIS — E785 Hyperlipidemia, unspecified: Secondary | ICD-10-CM | POA: Diagnosis not present

## 2021-03-04 DIAGNOSIS — I4891 Unspecified atrial fibrillation: Secondary | ICD-10-CM | POA: Diagnosis not present

## 2021-03-04 DIAGNOSIS — I509 Heart failure, unspecified: Secondary | ICD-10-CM | POA: Diagnosis not present

## 2021-03-04 DIAGNOSIS — N3946 Mixed incontinence: Secondary | ICD-10-CM | POA: Diagnosis not present

## 2021-03-04 DIAGNOSIS — R0602 Shortness of breath: Secondary | ICD-10-CM | POA: Diagnosis not present

## 2021-03-04 DIAGNOSIS — E569 Vitamin deficiency, unspecified: Secondary | ICD-10-CM | POA: Diagnosis not present

## 2021-03-05 DIAGNOSIS — E785 Hyperlipidemia, unspecified: Secondary | ICD-10-CM | POA: Diagnosis not present

## 2021-03-05 DIAGNOSIS — I509 Heart failure, unspecified: Secondary | ICD-10-CM | POA: Diagnosis not present

## 2021-03-05 DIAGNOSIS — E569 Vitamin deficiency, unspecified: Secondary | ICD-10-CM | POA: Diagnosis not present

## 2021-03-05 DIAGNOSIS — N3946 Mixed incontinence: Secondary | ICD-10-CM | POA: Diagnosis not present

## 2021-03-05 DIAGNOSIS — R0602 Shortness of breath: Secondary | ICD-10-CM | POA: Diagnosis not present

## 2021-03-05 DIAGNOSIS — I4891 Unspecified atrial fibrillation: Secondary | ICD-10-CM | POA: Diagnosis not present

## 2021-03-06 DIAGNOSIS — I5032 Chronic diastolic (congestive) heart failure: Secondary | ICD-10-CM | POA: Diagnosis not present

## 2021-03-06 DIAGNOSIS — N3946 Mixed incontinence: Secondary | ICD-10-CM | POA: Diagnosis not present

## 2021-03-06 DIAGNOSIS — E785 Hyperlipidemia, unspecified: Secondary | ICD-10-CM | POA: Diagnosis not present

## 2021-03-06 DIAGNOSIS — I4891 Unspecified atrial fibrillation: Secondary | ICD-10-CM | POA: Diagnosis not present

## 2021-03-06 DIAGNOSIS — F0390 Unspecified dementia without behavioral disturbance: Secondary | ICD-10-CM | POA: Diagnosis not present

## 2021-03-06 DIAGNOSIS — E569 Vitamin deficiency, unspecified: Secondary | ICD-10-CM | POA: Diagnosis not present

## 2021-03-06 DIAGNOSIS — I509 Heart failure, unspecified: Secondary | ICD-10-CM | POA: Diagnosis not present

## 2021-03-06 DIAGNOSIS — I739 Peripheral vascular disease, unspecified: Secondary | ICD-10-CM | POA: Diagnosis not present

## 2021-03-06 DIAGNOSIS — Z299 Encounter for prophylactic measures, unspecified: Secondary | ICD-10-CM | POA: Diagnosis not present

## 2021-03-06 DIAGNOSIS — R0602 Shortness of breath: Secondary | ICD-10-CM | POA: Diagnosis not present

## 2021-03-07 ENCOUNTER — Encounter (HOSPITAL_COMMUNITY): Payer: Self-pay

## 2021-03-07 ENCOUNTER — Emergency Department (HOSPITAL_COMMUNITY)
Admission: EM | Admit: 2021-03-07 | Discharge: 2021-03-07 | Disposition: A | Discharge status: Home / Self Care | Payer: Medicare Other | Attending: Emergency Medicine | Admitting: Emergency Medicine

## 2021-03-07 ENCOUNTER — Emergency Department (HOSPITAL_COMMUNITY): Payer: Medicare Other

## 2021-03-07 DIAGNOSIS — Z79899 Other long term (current) drug therapy: Secondary | ICD-10-CM | POA: Diagnosis not present

## 2021-03-07 DIAGNOSIS — K5900 Constipation, unspecified: Secondary | ICD-10-CM | POA: Diagnosis not present

## 2021-03-07 DIAGNOSIS — I110 Hypertensive heart disease with heart failure: Secondary | ICD-10-CM | POA: Insufficient documentation

## 2021-03-07 DIAGNOSIS — I509 Heart failure, unspecified: Secondary | ICD-10-CM | POA: Diagnosis not present

## 2021-03-07 DIAGNOSIS — R52 Pain, unspecified: Secondary | ICD-10-CM | POA: Diagnosis not present

## 2021-03-07 DIAGNOSIS — F0390 Unspecified dementia without behavioral disturbance: Secondary | ICD-10-CM | POA: Insufficient documentation

## 2021-03-07 DIAGNOSIS — R1084 Generalized abdominal pain: Secondary | ICD-10-CM | POA: Diagnosis not present

## 2021-03-07 DIAGNOSIS — Z7901 Long term (current) use of anticoagulants: Secondary | ICD-10-CM | POA: Insufficient documentation

## 2021-03-07 DIAGNOSIS — R918 Other nonspecific abnormal finding of lung field: Secondary | ICD-10-CM | POA: Diagnosis not present

## 2021-03-07 DIAGNOSIS — Z743 Need for continuous supervision: Secondary | ICD-10-CM | POA: Diagnosis not present

## 2021-03-07 DIAGNOSIS — I517 Cardiomegaly: Secondary | ICD-10-CM | POA: Diagnosis not present

## 2021-03-07 MED ORDER — METAMUCIL 0.52 G PO CAPS: 0.5200 g | ORAL_CAPSULE | Freq: Every day | ORAL | 0 refills | Status: DC

## 2021-03-07 NOTE — ED Provider Notes (Signed)
[facility] EMERGENCY DEPARTMENT Provider Note   CSN: [id number] Arrival date & time: 03/07/21  1427     History  Chief Complaint  Patient presents with   Constipation    Danielle Brooks is a 88 y.o. female.  Patient brought into the emergency department for constipation.  Patient has a history of dementia hypertension, congestive heart failure  The history is provided by the patient and medical records. No language interpreter was used.  Constipation Severity:  Mild Timing:  Constant Progression:  Waxing and waning Chronicity:  Recurrent Context: not dehydration   Stool description:  None produced Relieved by:  Nothing Worsened by:  Nothing Ineffective treatments:  None tried Associated symptoms: no abdominal pain, no back pain and no diarrhea       Home Medications Prior to Admission medications   Medication Sig Start Date End Date Taking? Authorizing Provider  psyllium (METAMUCIL) 0.52 g capsule Take 1 capsule (0.52 g total) by mouth daily. 03/07/21  Yes Milton Ferguson, MD  ALPRAZolam Duanne Moron) 0.25 MG tablet Take 0.25 mg by mouth at bedtime. 06/25/20   [provider]  amiodarone (PACERONE) 200 MG tablet TAKE ONE TABLET BY MOUTH ONCE DAILY 08/26/20   Satira Sark, MD  donepezil (ARICEPT) 5 MG tablet Take 5 mg by mouth daily.    [provider]  furosemide (LASIX) 40 MG tablet Take 40 mg by mouth at bedtime.    [provider]  losartan (COZAAR) 25 MG tablet TAKE 1/2 TABLET BY MOUTH DAILY 12/11/20   Satira Sark, MD  potassium chloride SA (KLOR-CON) 20 MEQ tablet Take 20 mEq by mouth at bedtime.    [provider]  traZODone (DESYREL) 100 MG tablet Take 100 mg by mouth at bedtime as needed.     [provider]  Vitamin D, Ergocalciferol, (DRISDOL) 50000 UNITS CAPS Take 50,000 Units by mouth every Tuesday.     [provider]  warfarin (COUMADIN) 5 MG tablet Take 2.5-5 mg by mouth See admin instructions. Take 1  tablet (5mg ) all days except on Wednesday take 1/2 tablet (2.5mg ) Managed by [facility] office    [provider]      Allergies    Bactrim [sulfamethoxazole-trimethoprim], Codeine, and Novocain [procaine hcl]    Review of Systems   Review of Systems  Constitutional:  Negative for appetite change and fatigue.  HENT:  Negative for congestion, ear discharge and sinus pressure.   Eyes:  Negative for discharge.  Respiratory:  Negative for cough.   Cardiovascular:  Negative for chest pain.  Gastrointestinal:  Positive for constipation. Negative for abdominal pain and diarrhea.  Genitourinary:  Negative for frequency and hematuria.  Musculoskeletal:  Negative for back pain.  Skin:  Negative for rash.  Neurological:  Negative for seizures and headaches.  Psychiatric/Behavioral:  Negative for hallucinations.    Physical Exam Updated Vital Signs BP (!) 158/97 (BP Location: Left Arm)    Pulse 98    Temp 98.9 F (37.2 C) (Oral)    Resp 20    Ht 5\' 2"  (1.575 m)    Wt 100 kg    SpO2 94%    BMI 40.32 kg/m  Physical Exam Vitals and nursing note reviewed.  Constitutional:      Appearance: She is well-developed.  HENT:     Head: Normocephalic.     Nose: Nose normal.  Eyes:     General: No scleral icterus.    Conjunctiva/sclera: Conjunctivae normal.  Neck:  Thyroid: No thyromegaly.  Cardiovascular:     Rate and Rhythm: Normal rate and regular rhythm.     Heart sounds: No murmur heard.   No friction rub. No gallop.  Pulmonary:     Breath sounds: No stridor. No wheezing or rales.  Chest:     Chest wall: No tenderness.  Abdominal:     General: There is no distension.     Tenderness: There is no abdominal tenderness. There is no rebound.  Musculoskeletal:        General: Normal range of motion.     Cervical back: Neck supple.  Lymphadenopathy:     Cervical: No cervical adenopathy.  Skin:    Findings: No erythema or rash.  Neurological:     Mental Status: She is alert.      Motor: No abnormal muscle tone.     Coordination: Coordination normal.     Comments: Oriented to person only  Psychiatric:        Behavior: Behavior normal.    ED Results / Procedures / Treatments   Labs (all labs ordered are listed, but only abnormal results are displayed) Labs Reviewed - No data to display  EKG None  Radiology DG ABD ACUTE 2+V W 1V CHEST  Result Date: 03/07/2021 CLINICAL DATA:  Constipation EXAM: DG ABDOMEN ACUTE WITH 1 VIEW CHEST COMPARISON:  None. FINDINGS: Underpenetrated abdominal radiographs. Occasional loops of gas-filled, nondistended bowel in the abdomen, with scattered stool throughout the colon and rectum. No obvious free air. No radiopaque calculi or other significant radiographic abnormality is seen. Cardiomegaly. Diffuse bilateral interstitial pulmonary opacity on underpenetrated AP portable radiograph. IMPRESSION: 1. Underpenetrated abdominal radiographs. Occasional loops of gas-filled, nondistended bowel in the abdomen, with scattered stool throughout the colon and rectum. No obvious free air. 2. Diffuse bilateral interstitial pulmonary opacity on underpenetrated portable radiograph, which may reflect edema in the setting of cardiomegaly. Electronically Signed   By: Delanna Ahmadi M.D.   On: 03/07/2021 16:34    Procedures Procedures    Medications Ordered in ED Medications - No data to display  ED Course/ Medical Decision Making/ A&P                           Medical Decision Making  Patient with mild constipation.  She was placed on Metamucil and follow-up with her PCP    This patient presents to the ED for concern of constipation, this involves an extensive number of treatment options, and is a complaint that carries with it a high risk of complications and morbidity.  The differential diagnosis includes constipation, appendicitis, colon cancer   Co morbidities that complicate the patient evaluation  Dementia, hypertension, congestive heart  failure   Additional history obtained:  Additional history obtained from relative External records from outside source obtained and reviewed including old hospital records reviewed   Lab Tests: No lab  Imaging Studies ordered:  I ordered imaging studies including abdominal series I independently visualized and interpreted imaging which showed unremarkable except for cardiomegaly I agree with the radiologist interpretation   Cardiac Monitoring:  The patient was maintained on a cardiac monitor.  I personally viewed and interpreted the cardiac monitored which showed an underlying rhythm of: Normal sinus rhythm   Medicines ordered and prescription drug management:  No medicines given Reevaluation of the patient after these medicines showed that the patient stayed the same I have reviewed the patients home medicines and have made adjustments as needed  Test Considered:  CT scan of abdomen   Critical Interventions:  None   Consultations Obtained:  No consult  Problem List / ED Course:  Constipation symptoms   Reevaluation:  After the interventions noted above, I reevaluated the patient and found that they have :stayed the same   Social Determinants of Health:  Dementia   Dispostion:  After consideration of the diagnostic results and the patients response to treatment, I feel that the patent would benefit from Metamucil follow-up with PCP.         Final Clinical Impression(s) / ED Diagnoses Final diagnoses:  Constipation, unspecified constipation type    Rx / DC Orders ED Discharge Orders          Ordered    psyllium (METAMUCIL) 0.52 g capsule  Daily        03/07/21 Ezzard Flax, MD 03/08/21 1137

## 2021-03-07 NOTE — ED Notes (Signed)
Pt son called and confirmed he would be at home for pt to arrive. EMS here to get pt.

## 2021-03-07 NOTE — Discharge Instructions (Signed)
Take the Metamucil for constipation and follow-up with your doctor as needed

## 2021-03-07 NOTE — ED Notes (Signed)
Me and tech Caryl Pina went in ,pulled pt up in bed and got her a warm blanket

## 2021-03-07 NOTE — ED Triage Notes (Signed)
Reports constipation for 5 days.  Pt bed dependent.  Alert and oriented no other issues

## 2021-03-09 DIAGNOSIS — I509 Heart failure, unspecified: Secondary | ICD-10-CM | POA: Diagnosis not present

## 2021-03-09 DIAGNOSIS — R0602 Shortness of breath: Secondary | ICD-10-CM | POA: Diagnosis not present

## 2021-03-09 DIAGNOSIS — N3946 Mixed incontinence: Secondary | ICD-10-CM | POA: Diagnosis not present

## 2021-03-09 DIAGNOSIS — I4891 Unspecified atrial fibrillation: Secondary | ICD-10-CM | POA: Diagnosis not present

## 2021-03-09 DIAGNOSIS — E785 Hyperlipidemia, unspecified: Secondary | ICD-10-CM | POA: Diagnosis not present

## 2021-03-09 DIAGNOSIS — E569 Vitamin deficiency, unspecified: Secondary | ICD-10-CM | POA: Diagnosis not present

## 2021-03-10 DIAGNOSIS — I509 Heart failure, unspecified: Secondary | ICD-10-CM | POA: Diagnosis not present

## 2021-03-10 DIAGNOSIS — E785 Hyperlipidemia, unspecified: Secondary | ICD-10-CM | POA: Diagnosis not present

## 2021-03-10 DIAGNOSIS — R0602 Shortness of breath: Secondary | ICD-10-CM | POA: Diagnosis not present

## 2021-03-10 DIAGNOSIS — I4891 Unspecified atrial fibrillation: Secondary | ICD-10-CM | POA: Diagnosis not present

## 2021-03-10 DIAGNOSIS — N3946 Mixed incontinence: Secondary | ICD-10-CM | POA: Diagnosis not present

## 2021-03-10 DIAGNOSIS — E569 Vitamin deficiency, unspecified: Secondary | ICD-10-CM | POA: Diagnosis not present

## 2021-03-11 DIAGNOSIS — E785 Hyperlipidemia, unspecified: Secondary | ICD-10-CM | POA: Diagnosis not present

## 2021-03-11 DIAGNOSIS — R0602 Shortness of breath: Secondary | ICD-10-CM | POA: Diagnosis not present

## 2021-03-11 DIAGNOSIS — N3946 Mixed incontinence: Secondary | ICD-10-CM | POA: Diagnosis not present

## 2021-03-11 DIAGNOSIS — I509 Heart failure, unspecified: Secondary | ICD-10-CM | POA: Diagnosis not present

## 2021-03-11 DIAGNOSIS — I4891 Unspecified atrial fibrillation: Secondary | ICD-10-CM | POA: Diagnosis not present

## 2021-03-11 DIAGNOSIS — E569 Vitamin deficiency, unspecified: Secondary | ICD-10-CM | POA: Diagnosis not present

## 2021-03-12 DIAGNOSIS — Z299 Encounter for prophylactic measures, unspecified: Secondary | ICD-10-CM | POA: Diagnosis not present

## 2021-03-12 DIAGNOSIS — I4891 Unspecified atrial fibrillation: Secondary | ICD-10-CM | POA: Diagnosis not present

## 2021-03-12 DIAGNOSIS — D692 Other nonthrombocytopenic purpura: Secondary | ICD-10-CM | POA: Diagnosis not present

## 2021-03-12 DIAGNOSIS — Z743 Need for continuous supervision: Secondary | ICD-10-CM | POA: Diagnosis not present

## 2021-03-12 DIAGNOSIS — R279 Unspecified lack of coordination: Secondary | ICD-10-CM | POA: Diagnosis not present

## 2021-03-12 DIAGNOSIS — I10 Essential (primary) hypertension: Secondary | ICD-10-CM | POA: Diagnosis not present

## 2021-03-12 DIAGNOSIS — N3946 Mixed incontinence: Secondary | ICD-10-CM | POA: Diagnosis not present

## 2021-03-12 DIAGNOSIS — E569 Vitamin deficiency, unspecified: Secondary | ICD-10-CM | POA: Diagnosis not present

## 2021-03-12 DIAGNOSIS — I509 Heart failure, unspecified: Secondary | ICD-10-CM | POA: Diagnosis not present

## 2021-03-12 DIAGNOSIS — R532 Functional quadriplegia: Secondary | ICD-10-CM | POA: Diagnosis not present

## 2021-03-12 DIAGNOSIS — R404 Transient alteration of awareness: Secondary | ICD-10-CM | POA: Diagnosis not present

## 2021-03-12 DIAGNOSIS — R5381 Other malaise: Secondary | ICD-10-CM | POA: Diagnosis not present

## 2021-03-12 DIAGNOSIS — R531 Weakness: Secondary | ICD-10-CM | POA: Diagnosis not present

## 2021-03-12 DIAGNOSIS — R0602 Shortness of breath: Secondary | ICD-10-CM | POA: Diagnosis not present

## 2021-03-12 DIAGNOSIS — E785 Hyperlipidemia, unspecified: Secondary | ICD-10-CM | POA: Diagnosis not present

## 2021-03-13 DIAGNOSIS — E785 Hyperlipidemia, unspecified: Secondary | ICD-10-CM | POA: Diagnosis not present

## 2021-03-13 DIAGNOSIS — N3946 Mixed incontinence: Secondary | ICD-10-CM | POA: Diagnosis not present

## 2021-03-13 DIAGNOSIS — I4891 Unspecified atrial fibrillation: Secondary | ICD-10-CM | POA: Diagnosis not present

## 2021-03-13 DIAGNOSIS — E569 Vitamin deficiency, unspecified: Secondary | ICD-10-CM | POA: Diagnosis not present

## 2021-03-13 DIAGNOSIS — R0602 Shortness of breath: Secondary | ICD-10-CM | POA: Diagnosis not present

## 2021-03-13 DIAGNOSIS — I509 Heart failure, unspecified: Secondary | ICD-10-CM | POA: Diagnosis not present

## 2021-03-16 ENCOUNTER — Telehealth: Payer: Medicare Other | Admitting: Cardiology

## 2021-03-19 DIAGNOSIS — I5032 Chronic diastolic (congestive) heart failure: Secondary | ICD-10-CM | POA: Diagnosis not present

## 2021-03-19 DIAGNOSIS — I4891 Unspecified atrial fibrillation: Secondary | ICD-10-CM | POA: Diagnosis not present

## 2021-03-19 DIAGNOSIS — Z515 Encounter for palliative care: Secondary | ICD-10-CM | POA: Diagnosis not present

## 2021-03-19 DIAGNOSIS — E569 Vitamin deficiency, unspecified: Secondary | ICD-10-CM | POA: Diagnosis not present

## 2021-03-19 DIAGNOSIS — N3946 Mixed incontinence: Secondary | ICD-10-CM | POA: Diagnosis not present

## 2021-03-19 DIAGNOSIS — F0390 Unspecified dementia without behavioral disturbance: Secondary | ICD-10-CM | POA: Diagnosis not present

## 2021-03-19 DIAGNOSIS — I509 Heart failure, unspecified: Secondary | ICD-10-CM | POA: Diagnosis not present

## 2021-03-19 DIAGNOSIS — Z299 Encounter for prophylactic measures, unspecified: Secondary | ICD-10-CM | POA: Diagnosis not present

## 2021-03-19 DIAGNOSIS — E785 Hyperlipidemia, unspecified: Secondary | ICD-10-CM | POA: Diagnosis not present

## 2021-03-19 DIAGNOSIS — Z789 Other specified health status: Secondary | ICD-10-CM | POA: Diagnosis not present

## 2021-03-19 DIAGNOSIS — Z6825 Body mass index (BMI) 25.0-25.9, adult: Secondary | ICD-10-CM | POA: Diagnosis not present

## 2021-03-19 DIAGNOSIS — R0602 Shortness of breath: Secondary | ICD-10-CM | POA: Diagnosis not present

## 2021-03-20 DIAGNOSIS — N3946 Mixed incontinence: Secondary | ICD-10-CM | POA: Diagnosis not present

## 2021-03-20 DIAGNOSIS — I509 Heart failure, unspecified: Secondary | ICD-10-CM | POA: Diagnosis not present

## 2021-03-20 DIAGNOSIS — E785 Hyperlipidemia, unspecified: Secondary | ICD-10-CM | POA: Diagnosis not present

## 2021-03-20 DIAGNOSIS — R0602 Shortness of breath: Secondary | ICD-10-CM | POA: Diagnosis not present

## 2021-03-20 DIAGNOSIS — I4891 Unspecified atrial fibrillation: Secondary | ICD-10-CM | POA: Diagnosis not present

## 2021-03-20 DIAGNOSIS — E569 Vitamin deficiency, unspecified: Secondary | ICD-10-CM | POA: Diagnosis not present

## 2021-03-27 DIAGNOSIS — E569 Vitamin deficiency, unspecified: Secondary | ICD-10-CM | POA: Diagnosis not present

## 2021-03-27 DIAGNOSIS — R0602 Shortness of breath: Secondary | ICD-10-CM | POA: Diagnosis not present

## 2021-03-27 DIAGNOSIS — I509 Heart failure, unspecified: Secondary | ICD-10-CM | POA: Diagnosis not present

## 2021-03-27 DIAGNOSIS — N3946 Mixed incontinence: Secondary | ICD-10-CM | POA: Diagnosis not present

## 2021-03-27 DIAGNOSIS — I4891 Unspecified atrial fibrillation: Secondary | ICD-10-CM | POA: Diagnosis not present

## 2021-03-27 DIAGNOSIS — E785 Hyperlipidemia, unspecified: Secondary | ICD-10-CM | POA: Diagnosis not present

## 2021-04-01 DIAGNOSIS — E569 Vitamin deficiency, unspecified: Secondary | ICD-10-CM | POA: Diagnosis not present

## 2021-04-01 DIAGNOSIS — I4891 Unspecified atrial fibrillation: Secondary | ICD-10-CM | POA: Diagnosis not present

## 2021-04-01 DIAGNOSIS — I10 Essential (primary) hypertension: Secondary | ICD-10-CM | POA: Diagnosis not present

## 2021-04-01 DIAGNOSIS — F411 Generalized anxiety disorder: Secondary | ICD-10-CM | POA: Diagnosis not present

## 2021-04-01 DIAGNOSIS — R0602 Shortness of breath: Secondary | ICD-10-CM | POA: Diagnosis not present

## 2021-04-01 DIAGNOSIS — I509 Heart failure, unspecified: Secondary | ICD-10-CM | POA: Diagnosis not present

## 2021-04-01 DIAGNOSIS — N3946 Mixed incontinence: Secondary | ICD-10-CM | POA: Diagnosis not present

## 2021-04-01 DIAGNOSIS — G311 Senile degeneration of brain, not elsewhere classified: Secondary | ICD-10-CM | POA: Diagnosis not present

## 2021-04-01 DIAGNOSIS — R531 Weakness: Secondary | ICD-10-CM | POA: Diagnosis not present

## 2021-04-01 DIAGNOSIS — E785 Hyperlipidemia, unspecified: Secondary | ICD-10-CM | POA: Diagnosis not present

## 2021-04-01 DIAGNOSIS — K5900 Constipation, unspecified: Secondary | ICD-10-CM | POA: Diagnosis not present

## 2021-04-03 DIAGNOSIS — E785 Hyperlipidemia, unspecified: Secondary | ICD-10-CM | POA: Diagnosis not present

## 2021-04-03 DIAGNOSIS — R0602 Shortness of breath: Secondary | ICD-10-CM | POA: Diagnosis not present

## 2021-04-03 DIAGNOSIS — E569 Vitamin deficiency, unspecified: Secondary | ICD-10-CM | POA: Diagnosis not present

## 2021-04-03 DIAGNOSIS — N3946 Mixed incontinence: Secondary | ICD-10-CM | POA: Diagnosis not present

## 2021-04-03 DIAGNOSIS — I509 Heart failure, unspecified: Secondary | ICD-10-CM | POA: Diagnosis not present

## 2021-04-03 DIAGNOSIS — I4891 Unspecified atrial fibrillation: Secondary | ICD-10-CM | POA: Diagnosis not present

## 2021-04-06 DIAGNOSIS — I509 Heart failure, unspecified: Secondary | ICD-10-CM | POA: Diagnosis not present

## 2021-04-06 DIAGNOSIS — E785 Hyperlipidemia, unspecified: Secondary | ICD-10-CM | POA: Diagnosis not present

## 2021-04-06 DIAGNOSIS — R0602 Shortness of breath: Secondary | ICD-10-CM | POA: Diagnosis not present

## 2021-04-06 DIAGNOSIS — E569 Vitamin deficiency, unspecified: Secondary | ICD-10-CM | POA: Diagnosis not present

## 2021-04-06 DIAGNOSIS — I4891 Unspecified atrial fibrillation: Secondary | ICD-10-CM | POA: Diagnosis not present

## 2021-04-06 DIAGNOSIS — N3946 Mixed incontinence: Secondary | ICD-10-CM | POA: Diagnosis not present

## 2021-04-08 DIAGNOSIS — R0602 Shortness of breath: Secondary | ICD-10-CM | POA: Diagnosis not present

## 2021-04-08 DIAGNOSIS — N3946 Mixed incontinence: Secondary | ICD-10-CM | POA: Diagnosis not present

## 2021-04-08 DIAGNOSIS — I4891 Unspecified atrial fibrillation: Secondary | ICD-10-CM | POA: Diagnosis not present

## 2021-04-08 DIAGNOSIS — E785 Hyperlipidemia, unspecified: Secondary | ICD-10-CM | POA: Diagnosis not present

## 2021-04-08 DIAGNOSIS — I509 Heart failure, unspecified: Secondary | ICD-10-CM | POA: Diagnosis not present

## 2021-04-08 DIAGNOSIS — E569 Vitamin deficiency, unspecified: Secondary | ICD-10-CM | POA: Diagnosis not present

## 2021-04-10 DIAGNOSIS — E785 Hyperlipidemia, unspecified: Secondary | ICD-10-CM | POA: Diagnosis not present

## 2021-04-10 DIAGNOSIS — I509 Heart failure, unspecified: Secondary | ICD-10-CM | POA: Diagnosis not present

## 2021-04-10 DIAGNOSIS — I4891 Unspecified atrial fibrillation: Secondary | ICD-10-CM | POA: Diagnosis not present

## 2021-04-10 DIAGNOSIS — N3946 Mixed incontinence: Secondary | ICD-10-CM | POA: Diagnosis not present

## 2021-04-10 DIAGNOSIS — E569 Vitamin deficiency, unspecified: Secondary | ICD-10-CM | POA: Diagnosis not present

## 2021-04-10 DIAGNOSIS — R0602 Shortness of breath: Secondary | ICD-10-CM | POA: Diagnosis not present

## 2021-04-14 DIAGNOSIS — Z299 Encounter for prophylactic measures, unspecified: Secondary | ICD-10-CM | POA: Diagnosis not present

## 2021-04-14 DIAGNOSIS — F419 Anxiety disorder, unspecified: Secondary | ICD-10-CM | POA: Diagnosis not present

## 2021-04-14 DIAGNOSIS — I4891 Unspecified atrial fibrillation: Secondary | ICD-10-CM | POA: Diagnosis not present

## 2021-04-15 DIAGNOSIS — I509 Heart failure, unspecified: Secondary | ICD-10-CM | POA: Diagnosis not present

## 2021-04-15 DIAGNOSIS — I4891 Unspecified atrial fibrillation: Secondary | ICD-10-CM | POA: Diagnosis not present

## 2021-04-15 DIAGNOSIS — E569 Vitamin deficiency, unspecified: Secondary | ICD-10-CM | POA: Diagnosis not present

## 2021-04-15 DIAGNOSIS — R0602 Shortness of breath: Secondary | ICD-10-CM | POA: Diagnosis not present

## 2021-04-15 DIAGNOSIS — N3946 Mixed incontinence: Secondary | ICD-10-CM | POA: Diagnosis not present

## 2021-04-15 DIAGNOSIS — E785 Hyperlipidemia, unspecified: Secondary | ICD-10-CM | POA: Diagnosis not present

## 2021-04-16 DIAGNOSIS — I4891 Unspecified atrial fibrillation: Secondary | ICD-10-CM | POA: Diagnosis not present

## 2021-04-16 DIAGNOSIS — N3946 Mixed incontinence: Secondary | ICD-10-CM | POA: Diagnosis not present

## 2021-04-16 DIAGNOSIS — R0602 Shortness of breath: Secondary | ICD-10-CM | POA: Diagnosis not present

## 2021-04-16 DIAGNOSIS — E569 Vitamin deficiency, unspecified: Secondary | ICD-10-CM | POA: Diagnosis not present

## 2021-04-16 DIAGNOSIS — E785 Hyperlipidemia, unspecified: Secondary | ICD-10-CM | POA: Diagnosis not present

## 2021-04-16 DIAGNOSIS — I509 Heart failure, unspecified: Secondary | ICD-10-CM | POA: Diagnosis not present

## 2021-04-22 DIAGNOSIS — E569 Vitamin deficiency, unspecified: Secondary | ICD-10-CM | POA: Diagnosis not present

## 2021-04-22 DIAGNOSIS — R0602 Shortness of breath: Secondary | ICD-10-CM | POA: Diagnosis not present

## 2021-04-22 DIAGNOSIS — I4891 Unspecified atrial fibrillation: Secondary | ICD-10-CM | POA: Diagnosis not present

## 2021-04-22 DIAGNOSIS — N3946 Mixed incontinence: Secondary | ICD-10-CM | POA: Diagnosis not present

## 2021-04-22 DIAGNOSIS — E785 Hyperlipidemia, unspecified: Secondary | ICD-10-CM | POA: Diagnosis not present

## 2021-04-22 DIAGNOSIS — I509 Heart failure, unspecified: Secondary | ICD-10-CM | POA: Diagnosis not present

## 2021-04-24 DIAGNOSIS — I4891 Unspecified atrial fibrillation: Secondary | ICD-10-CM | POA: Diagnosis not present

## 2021-04-24 DIAGNOSIS — E785 Hyperlipidemia, unspecified: Secondary | ICD-10-CM | POA: Diagnosis not present

## 2021-04-24 DIAGNOSIS — N3946 Mixed incontinence: Secondary | ICD-10-CM | POA: Diagnosis not present

## 2021-04-24 DIAGNOSIS — I509 Heart failure, unspecified: Secondary | ICD-10-CM | POA: Diagnosis not present

## 2021-04-24 DIAGNOSIS — E569 Vitamin deficiency, unspecified: Secondary | ICD-10-CM | POA: Diagnosis not present

## 2021-04-24 DIAGNOSIS — R0602 Shortness of breath: Secondary | ICD-10-CM | POA: Diagnosis not present

## 2021-04-29 DIAGNOSIS — F411 Generalized anxiety disorder: Secondary | ICD-10-CM | POA: Diagnosis not present

## 2021-04-29 DIAGNOSIS — E569 Vitamin deficiency, unspecified: Secondary | ICD-10-CM | POA: Diagnosis not present

## 2021-04-29 DIAGNOSIS — G311 Senile degeneration of brain, not elsewhere classified: Secondary | ICD-10-CM | POA: Diagnosis not present

## 2021-04-29 DIAGNOSIS — I4891 Unspecified atrial fibrillation: Secondary | ICD-10-CM | POA: Diagnosis not present

## 2021-04-29 DIAGNOSIS — N3946 Mixed incontinence: Secondary | ICD-10-CM | POA: Diagnosis not present

## 2021-04-29 DIAGNOSIS — I10 Essential (primary) hypertension: Secondary | ICD-10-CM | POA: Diagnosis not present

## 2021-04-29 DIAGNOSIS — R0602 Shortness of breath: Secondary | ICD-10-CM | POA: Diagnosis not present

## 2021-04-29 DIAGNOSIS — E785 Hyperlipidemia, unspecified: Secondary | ICD-10-CM | POA: Diagnosis not present

## 2021-04-29 DIAGNOSIS — K5900 Constipation, unspecified: Secondary | ICD-10-CM | POA: Diagnosis not present

## 2021-04-29 DIAGNOSIS — R531 Weakness: Secondary | ICD-10-CM | POA: Diagnosis not present

## 2021-04-29 DIAGNOSIS — I509 Heart failure, unspecified: Secondary | ICD-10-CM | POA: Diagnosis not present

## 2021-05-01 ENCOUNTER — Telehealth: Payer: Self-pay

## 2021-05-01 DIAGNOSIS — E785 Hyperlipidemia, unspecified: Secondary | ICD-10-CM | POA: Diagnosis not present

## 2021-05-01 DIAGNOSIS — I4891 Unspecified atrial fibrillation: Secondary | ICD-10-CM | POA: Diagnosis not present

## 2021-05-01 DIAGNOSIS — R0602 Shortness of breath: Secondary | ICD-10-CM | POA: Diagnosis not present

## 2021-05-01 DIAGNOSIS — N3946 Mixed incontinence: Secondary | ICD-10-CM | POA: Diagnosis not present

## 2021-05-01 DIAGNOSIS — I509 Heart failure, unspecified: Secondary | ICD-10-CM | POA: Diagnosis not present

## 2021-05-01 DIAGNOSIS — E569 Vitamin deficiency, unspecified: Secondary | ICD-10-CM | POA: Diagnosis not present

## 2021-05-01 NOTE — Telephone Encounter (Signed)
VM left for patient and husband to check in on patient. Call back information provided ?

## 2021-05-05 DIAGNOSIS — I5032 Chronic diastolic (congestive) heart failure: Secondary | ICD-10-CM | POA: Diagnosis not present

## 2021-05-05 DIAGNOSIS — F0390 Unspecified dementia without behavioral disturbance: Secondary | ICD-10-CM | POA: Diagnosis not present

## 2021-05-05 DIAGNOSIS — N3946 Mixed incontinence: Secondary | ICD-10-CM | POA: Diagnosis not present

## 2021-05-05 DIAGNOSIS — D692 Other nonthrombocytopenic purpura: Secondary | ICD-10-CM | POA: Diagnosis not present

## 2021-05-05 DIAGNOSIS — I10 Essential (primary) hypertension: Secondary | ICD-10-CM | POA: Diagnosis not present

## 2021-05-05 DIAGNOSIS — E785 Hyperlipidemia, unspecified: Secondary | ICD-10-CM | POA: Diagnosis not present

## 2021-05-05 DIAGNOSIS — I509 Heart failure, unspecified: Secondary | ICD-10-CM | POA: Diagnosis not present

## 2021-05-05 DIAGNOSIS — R0602 Shortness of breath: Secondary | ICD-10-CM | POA: Diagnosis not present

## 2021-05-05 DIAGNOSIS — Z299 Encounter for prophylactic measures, unspecified: Secondary | ICD-10-CM | POA: Diagnosis not present

## 2021-05-05 DIAGNOSIS — I4891 Unspecified atrial fibrillation: Secondary | ICD-10-CM | POA: Diagnosis not present

## 2021-05-05 DIAGNOSIS — E569 Vitamin deficiency, unspecified: Secondary | ICD-10-CM | POA: Diagnosis not present

## 2021-05-06 DIAGNOSIS — N3946 Mixed incontinence: Secondary | ICD-10-CM | POA: Diagnosis not present

## 2021-05-06 DIAGNOSIS — E785 Hyperlipidemia, unspecified: Secondary | ICD-10-CM | POA: Diagnosis not present

## 2021-05-06 DIAGNOSIS — E569 Vitamin deficiency, unspecified: Secondary | ICD-10-CM | POA: Diagnosis not present

## 2021-05-06 DIAGNOSIS — I509 Heart failure, unspecified: Secondary | ICD-10-CM | POA: Diagnosis not present

## 2021-05-06 DIAGNOSIS — I4891 Unspecified atrial fibrillation: Secondary | ICD-10-CM | POA: Diagnosis not present

## 2021-05-06 DIAGNOSIS — R0602 Shortness of breath: Secondary | ICD-10-CM | POA: Diagnosis not present

## 2021-05-07 DIAGNOSIS — E569 Vitamin deficiency, unspecified: Secondary | ICD-10-CM | POA: Diagnosis not present

## 2021-05-07 DIAGNOSIS — I4891 Unspecified atrial fibrillation: Secondary | ICD-10-CM | POA: Diagnosis not present

## 2021-05-07 DIAGNOSIS — E785 Hyperlipidemia, unspecified: Secondary | ICD-10-CM | POA: Diagnosis not present

## 2021-05-07 DIAGNOSIS — N3946 Mixed incontinence: Secondary | ICD-10-CM | POA: Diagnosis not present

## 2021-05-07 DIAGNOSIS — I509 Heart failure, unspecified: Secondary | ICD-10-CM | POA: Diagnosis not present

## 2021-05-07 DIAGNOSIS — R0602 Shortness of breath: Secondary | ICD-10-CM | POA: Diagnosis not present

## 2021-05-12 ENCOUNTER — Telehealth: Payer: Self-pay

## 2021-05-12 DIAGNOSIS — E785 Hyperlipidemia, unspecified: Secondary | ICD-10-CM | POA: Diagnosis not present

## 2021-05-12 DIAGNOSIS — I509 Heart failure, unspecified: Secondary | ICD-10-CM | POA: Diagnosis not present

## 2021-05-12 DIAGNOSIS — E569 Vitamin deficiency, unspecified: Secondary | ICD-10-CM | POA: Diagnosis not present

## 2021-05-12 DIAGNOSIS — F0390 Unspecified dementia without behavioral disturbance: Secondary | ICD-10-CM | POA: Diagnosis not present

## 2021-05-12 DIAGNOSIS — F419 Anxiety disorder, unspecified: Secondary | ICD-10-CM | POA: Diagnosis not present

## 2021-05-12 DIAGNOSIS — R0602 Shortness of breath: Secondary | ICD-10-CM | POA: Diagnosis not present

## 2021-05-12 DIAGNOSIS — N3946 Mixed incontinence: Secondary | ICD-10-CM | POA: Diagnosis not present

## 2021-05-12 DIAGNOSIS — Z299 Encounter for prophylactic measures, unspecified: Secondary | ICD-10-CM | POA: Diagnosis not present

## 2021-05-12 DIAGNOSIS — I4891 Unspecified atrial fibrillation: Secondary | ICD-10-CM | POA: Diagnosis not present

## 2021-05-12 NOTE — Telephone Encounter (Signed)
Phone call placed to patient/husband to check in. VM left. ?

## 2021-05-13 DIAGNOSIS — N3946 Mixed incontinence: Secondary | ICD-10-CM | POA: Diagnosis not present

## 2021-05-13 DIAGNOSIS — I4891 Unspecified atrial fibrillation: Secondary | ICD-10-CM | POA: Diagnosis not present

## 2021-05-13 DIAGNOSIS — R0602 Shortness of breath: Secondary | ICD-10-CM | POA: Diagnosis not present

## 2021-05-13 DIAGNOSIS — E785 Hyperlipidemia, unspecified: Secondary | ICD-10-CM | POA: Diagnosis not present

## 2021-05-13 DIAGNOSIS — I509 Heart failure, unspecified: Secondary | ICD-10-CM | POA: Diagnosis not present

## 2021-05-13 DIAGNOSIS — E569 Vitamin deficiency, unspecified: Secondary | ICD-10-CM | POA: Diagnosis not present

## 2021-05-15 DIAGNOSIS — R0602 Shortness of breath: Secondary | ICD-10-CM | POA: Diagnosis not present

## 2021-05-15 DIAGNOSIS — E785 Hyperlipidemia, unspecified: Secondary | ICD-10-CM | POA: Diagnosis not present

## 2021-05-15 DIAGNOSIS — E569 Vitamin deficiency, unspecified: Secondary | ICD-10-CM | POA: Diagnosis not present

## 2021-05-15 DIAGNOSIS — N3946 Mixed incontinence: Secondary | ICD-10-CM | POA: Diagnosis not present

## 2021-05-15 DIAGNOSIS — I4891 Unspecified atrial fibrillation: Secondary | ICD-10-CM | POA: Diagnosis not present

## 2021-05-15 DIAGNOSIS — I509 Heart failure, unspecified: Secondary | ICD-10-CM | POA: Diagnosis not present

## 2021-05-20 DIAGNOSIS — E785 Hyperlipidemia, unspecified: Secondary | ICD-10-CM | POA: Diagnosis not present

## 2021-05-20 DIAGNOSIS — I509 Heart failure, unspecified: Secondary | ICD-10-CM | POA: Diagnosis not present

## 2021-05-20 DIAGNOSIS — R0602 Shortness of breath: Secondary | ICD-10-CM | POA: Diagnosis not present

## 2021-05-20 DIAGNOSIS — I4891 Unspecified atrial fibrillation: Secondary | ICD-10-CM | POA: Diagnosis not present

## 2021-05-20 DIAGNOSIS — N3946 Mixed incontinence: Secondary | ICD-10-CM | POA: Diagnosis not present

## 2021-05-20 DIAGNOSIS — E569 Vitamin deficiency, unspecified: Secondary | ICD-10-CM | POA: Diagnosis not present

## 2021-05-21 DIAGNOSIS — E785 Hyperlipidemia, unspecified: Secondary | ICD-10-CM | POA: Diagnosis not present

## 2021-05-21 DIAGNOSIS — E569 Vitamin deficiency, unspecified: Secondary | ICD-10-CM | POA: Diagnosis not present

## 2021-05-21 DIAGNOSIS — N3946 Mixed incontinence: Secondary | ICD-10-CM | POA: Diagnosis not present

## 2021-05-21 DIAGNOSIS — I509 Heart failure, unspecified: Secondary | ICD-10-CM | POA: Diagnosis not present

## 2021-05-21 DIAGNOSIS — I4891 Unspecified atrial fibrillation: Secondary | ICD-10-CM | POA: Diagnosis not present

## 2021-05-21 DIAGNOSIS — R0602 Shortness of breath: Secondary | ICD-10-CM | POA: Diagnosis not present

## 2021-05-27 DIAGNOSIS — I509 Heart failure, unspecified: Secondary | ICD-10-CM | POA: Diagnosis not present

## 2021-05-27 DIAGNOSIS — R0602 Shortness of breath: Secondary | ICD-10-CM | POA: Diagnosis not present

## 2021-05-27 DIAGNOSIS — N3946 Mixed incontinence: Secondary | ICD-10-CM | POA: Diagnosis not present

## 2021-05-27 DIAGNOSIS — E785 Hyperlipidemia, unspecified: Secondary | ICD-10-CM | POA: Diagnosis not present

## 2021-05-27 DIAGNOSIS — I4891 Unspecified atrial fibrillation: Secondary | ICD-10-CM | POA: Diagnosis not present

## 2021-05-27 DIAGNOSIS — E569 Vitamin deficiency, unspecified: Secondary | ICD-10-CM | POA: Diagnosis not present

## 2021-05-28 DIAGNOSIS — F0390 Unspecified dementia without behavioral disturbance: Secondary | ICD-10-CM | POA: Diagnosis not present

## 2021-05-28 DIAGNOSIS — I4891 Unspecified atrial fibrillation: Secondary | ICD-10-CM | POA: Diagnosis not present

## 2021-05-28 DIAGNOSIS — R0602 Shortness of breath: Secondary | ICD-10-CM | POA: Diagnosis not present

## 2021-05-28 DIAGNOSIS — Z299 Encounter for prophylactic measures, unspecified: Secondary | ICD-10-CM | POA: Diagnosis not present

## 2021-05-28 DIAGNOSIS — E569 Vitamin deficiency, unspecified: Secondary | ICD-10-CM | POA: Diagnosis not present

## 2021-05-28 DIAGNOSIS — I509 Heart failure, unspecified: Secondary | ICD-10-CM | POA: Diagnosis not present

## 2021-05-28 DIAGNOSIS — N3946 Mixed incontinence: Secondary | ICD-10-CM | POA: Diagnosis not present

## 2021-05-28 DIAGNOSIS — E785 Hyperlipidemia, unspecified: Secondary | ICD-10-CM | POA: Diagnosis not present

## 2021-05-28 DIAGNOSIS — Z789 Other specified health status: Secondary | ICD-10-CM | POA: Diagnosis not present

## 2021-05-30 DIAGNOSIS — R531 Weakness: Secondary | ICD-10-CM | POA: Diagnosis not present

## 2021-05-30 DIAGNOSIS — G311 Senile degeneration of brain, not elsewhere classified: Secondary | ICD-10-CM | POA: Diagnosis not present

## 2021-05-30 DIAGNOSIS — N3946 Mixed incontinence: Secondary | ICD-10-CM | POA: Diagnosis not present

## 2021-05-30 DIAGNOSIS — I4891 Unspecified atrial fibrillation: Secondary | ICD-10-CM | POA: Diagnosis not present

## 2021-05-30 DIAGNOSIS — R0602 Shortness of breath: Secondary | ICD-10-CM | POA: Diagnosis not present

## 2021-05-30 DIAGNOSIS — I10 Essential (primary) hypertension: Secondary | ICD-10-CM | POA: Diagnosis not present

## 2021-05-30 DIAGNOSIS — K5900 Constipation, unspecified: Secondary | ICD-10-CM | POA: Diagnosis not present

## 2021-05-30 DIAGNOSIS — E785 Hyperlipidemia, unspecified: Secondary | ICD-10-CM | POA: Diagnosis not present

## 2021-05-30 DIAGNOSIS — E569 Vitamin deficiency, unspecified: Secondary | ICD-10-CM | POA: Diagnosis not present

## 2021-05-30 DIAGNOSIS — I509 Heart failure, unspecified: Secondary | ICD-10-CM | POA: Diagnosis not present

## 2021-05-30 DIAGNOSIS — F411 Generalized anxiety disorder: Secondary | ICD-10-CM | POA: Diagnosis not present

## 2021-06-03 DIAGNOSIS — I4891 Unspecified atrial fibrillation: Secondary | ICD-10-CM | POA: Diagnosis not present

## 2021-06-03 DIAGNOSIS — E785 Hyperlipidemia, unspecified: Secondary | ICD-10-CM | POA: Diagnosis not present

## 2021-06-03 DIAGNOSIS — N3946 Mixed incontinence: Secondary | ICD-10-CM | POA: Diagnosis not present

## 2021-06-03 DIAGNOSIS — R0602 Shortness of breath: Secondary | ICD-10-CM | POA: Diagnosis not present

## 2021-06-03 DIAGNOSIS — I509 Heart failure, unspecified: Secondary | ICD-10-CM | POA: Diagnosis not present

## 2021-06-03 DIAGNOSIS — E569 Vitamin deficiency, unspecified: Secondary | ICD-10-CM | POA: Diagnosis not present

## 2021-06-04 DIAGNOSIS — E785 Hyperlipidemia, unspecified: Secondary | ICD-10-CM | POA: Diagnosis not present

## 2021-06-04 DIAGNOSIS — I4891 Unspecified atrial fibrillation: Secondary | ICD-10-CM | POA: Diagnosis not present

## 2021-06-04 DIAGNOSIS — I509 Heart failure, unspecified: Secondary | ICD-10-CM | POA: Diagnosis not present

## 2021-06-04 DIAGNOSIS — R0602 Shortness of breath: Secondary | ICD-10-CM | POA: Diagnosis not present

## 2021-06-04 DIAGNOSIS — N3946 Mixed incontinence: Secondary | ICD-10-CM | POA: Diagnosis not present

## 2021-06-04 DIAGNOSIS — E569 Vitamin deficiency, unspecified: Secondary | ICD-10-CM | POA: Diagnosis not present

## 2021-06-10 DIAGNOSIS — E569 Vitamin deficiency, unspecified: Secondary | ICD-10-CM | POA: Diagnosis not present

## 2021-06-10 DIAGNOSIS — E785 Hyperlipidemia, unspecified: Secondary | ICD-10-CM | POA: Diagnosis not present

## 2021-06-10 DIAGNOSIS — N3946 Mixed incontinence: Secondary | ICD-10-CM | POA: Diagnosis not present

## 2021-06-10 DIAGNOSIS — R0602 Shortness of breath: Secondary | ICD-10-CM | POA: Diagnosis not present

## 2021-06-10 DIAGNOSIS — I509 Heart failure, unspecified: Secondary | ICD-10-CM | POA: Diagnosis not present

## 2021-06-10 DIAGNOSIS — I4891 Unspecified atrial fibrillation: Secondary | ICD-10-CM | POA: Diagnosis not present

## 2021-06-14 DIAGNOSIS — E569 Vitamin deficiency, unspecified: Secondary | ICD-10-CM | POA: Diagnosis not present

## 2021-06-14 DIAGNOSIS — R0602 Shortness of breath: Secondary | ICD-10-CM | POA: Diagnosis not present

## 2021-06-14 DIAGNOSIS — I4891 Unspecified atrial fibrillation: Secondary | ICD-10-CM | POA: Diagnosis not present

## 2021-06-14 DIAGNOSIS — E785 Hyperlipidemia, unspecified: Secondary | ICD-10-CM | POA: Diagnosis not present

## 2021-06-14 DIAGNOSIS — N3946 Mixed incontinence: Secondary | ICD-10-CM | POA: Diagnosis not present

## 2021-06-14 DIAGNOSIS — I509 Heart failure, unspecified: Secondary | ICD-10-CM | POA: Diagnosis not present

## 2021-06-16 DIAGNOSIS — Z515 Encounter for palliative care: Secondary | ICD-10-CM | POA: Diagnosis not present

## 2021-06-16 DIAGNOSIS — Z299 Encounter for prophylactic measures, unspecified: Secondary | ICD-10-CM | POA: Diagnosis not present

## 2021-06-16 DIAGNOSIS — R532 Functional quadriplegia: Secondary | ICD-10-CM | POA: Diagnosis not present

## 2021-06-17 DIAGNOSIS — N3946 Mixed incontinence: Secondary | ICD-10-CM | POA: Diagnosis not present

## 2021-06-17 DIAGNOSIS — R0602 Shortness of breath: Secondary | ICD-10-CM | POA: Diagnosis not present

## 2021-06-17 DIAGNOSIS — I509 Heart failure, unspecified: Secondary | ICD-10-CM | POA: Diagnosis not present

## 2021-06-17 DIAGNOSIS — E569 Vitamin deficiency, unspecified: Secondary | ICD-10-CM | POA: Diagnosis not present

## 2021-06-17 DIAGNOSIS — E785 Hyperlipidemia, unspecified: Secondary | ICD-10-CM | POA: Diagnosis not present

## 2021-06-17 DIAGNOSIS — I4891 Unspecified atrial fibrillation: Secondary | ICD-10-CM | POA: Diagnosis not present

## 2021-06-23 ENCOUNTER — Telehealth: Payer: Self-pay

## 2021-06-23 NOTE — Telephone Encounter (Signed)
Palliative volunteer call to follow up on patient.  Pt not ambulatory, has some heart issues and sometimes confused. Report forwarded to NP [place] with palliative care ?

## 2021-06-24 DIAGNOSIS — E569 Vitamin deficiency, unspecified: Secondary | ICD-10-CM | POA: Diagnosis not present

## 2021-06-24 DIAGNOSIS — E785 Hyperlipidemia, unspecified: Secondary | ICD-10-CM | POA: Diagnosis not present

## 2021-06-24 DIAGNOSIS — I509 Heart failure, unspecified: Secondary | ICD-10-CM | POA: Diagnosis not present

## 2021-06-24 DIAGNOSIS — N3946 Mixed incontinence: Secondary | ICD-10-CM | POA: Diagnosis not present

## 2021-06-24 DIAGNOSIS — I4891 Unspecified atrial fibrillation: Secondary | ICD-10-CM | POA: Diagnosis not present

## 2021-06-24 DIAGNOSIS — R0602 Shortness of breath: Secondary | ICD-10-CM | POA: Diagnosis not present

## 2021-06-25 DIAGNOSIS — R0602 Shortness of breath: Secondary | ICD-10-CM | POA: Diagnosis not present

## 2021-06-25 DIAGNOSIS — I4891 Unspecified atrial fibrillation: Secondary | ICD-10-CM | POA: Diagnosis not present

## 2021-06-25 DIAGNOSIS — Z299 Encounter for prophylactic measures, unspecified: Secondary | ICD-10-CM | POA: Diagnosis not present

## 2021-06-25 DIAGNOSIS — Z6825 Body mass index (BMI) 25.0-25.9, adult: Secondary | ICD-10-CM | POA: Diagnosis not present

## 2021-06-25 DIAGNOSIS — E569 Vitamin deficiency, unspecified: Secondary | ICD-10-CM | POA: Diagnosis not present

## 2021-06-25 DIAGNOSIS — N3946 Mixed incontinence: Secondary | ICD-10-CM | POA: Diagnosis not present

## 2021-06-25 DIAGNOSIS — E785 Hyperlipidemia, unspecified: Secondary | ICD-10-CM | POA: Diagnosis not present

## 2021-06-25 DIAGNOSIS — I10 Essential (primary) hypertension: Secondary | ICD-10-CM | POA: Diagnosis not present

## 2021-06-25 DIAGNOSIS — I509 Heart failure, unspecified: Secondary | ICD-10-CM | POA: Diagnosis not present

## 2021-06-25 DIAGNOSIS — Z713 Dietary counseling and surveillance: Secondary | ICD-10-CM | POA: Diagnosis not present

## 2021-06-25 DIAGNOSIS — F419 Anxiety disorder, unspecified: Secondary | ICD-10-CM | POA: Diagnosis not present

## 2021-06-29 DIAGNOSIS — G311 Senile degeneration of brain, not elsewhere classified: Secondary | ICD-10-CM | POA: Diagnosis not present

## 2021-06-29 DIAGNOSIS — F411 Generalized anxiety disorder: Secondary | ICD-10-CM | POA: Diagnosis not present

## 2021-06-29 DIAGNOSIS — K5900 Constipation, unspecified: Secondary | ICD-10-CM | POA: Diagnosis not present

## 2021-06-29 DIAGNOSIS — I10 Essential (primary) hypertension: Secondary | ICD-10-CM | POA: Diagnosis not present

## 2021-06-29 DIAGNOSIS — N3946 Mixed incontinence: Secondary | ICD-10-CM | POA: Diagnosis not present

## 2021-06-29 DIAGNOSIS — I509 Heart failure, unspecified: Secondary | ICD-10-CM | POA: Diagnosis not present

## 2021-06-29 DIAGNOSIS — E785 Hyperlipidemia, unspecified: Secondary | ICD-10-CM | POA: Diagnosis not present

## 2021-06-29 DIAGNOSIS — E569 Vitamin deficiency, unspecified: Secondary | ICD-10-CM | POA: Diagnosis not present

## 2021-06-29 DIAGNOSIS — R0602 Shortness of breath: Secondary | ICD-10-CM | POA: Diagnosis not present

## 2021-06-29 DIAGNOSIS — R531 Weakness: Secondary | ICD-10-CM | POA: Diagnosis not present

## 2021-06-29 DIAGNOSIS — I4891 Unspecified atrial fibrillation: Secondary | ICD-10-CM | POA: Diagnosis not present

## 2021-06-30 DIAGNOSIS — I509 Heart failure, unspecified: Secondary | ICD-10-CM | POA: Diagnosis not present

## 2021-06-30 DIAGNOSIS — N3946 Mixed incontinence: Secondary | ICD-10-CM | POA: Diagnosis not present

## 2021-06-30 DIAGNOSIS — R0602 Shortness of breath: Secondary | ICD-10-CM | POA: Diagnosis not present

## 2021-06-30 DIAGNOSIS — E569 Vitamin deficiency, unspecified: Secondary | ICD-10-CM | POA: Diagnosis not present

## 2021-06-30 DIAGNOSIS — E785 Hyperlipidemia, unspecified: Secondary | ICD-10-CM | POA: Diagnosis not present

## 2021-06-30 DIAGNOSIS — I4891 Unspecified atrial fibrillation: Secondary | ICD-10-CM | POA: Diagnosis not present

## 2021-07-01 DIAGNOSIS — I4891 Unspecified atrial fibrillation: Secondary | ICD-10-CM | POA: Diagnosis not present

## 2021-07-01 DIAGNOSIS — I509 Heart failure, unspecified: Secondary | ICD-10-CM | POA: Diagnosis not present

## 2021-07-01 DIAGNOSIS — E569 Vitamin deficiency, unspecified: Secondary | ICD-10-CM | POA: Diagnosis not present

## 2021-07-01 DIAGNOSIS — R0602 Shortness of breath: Secondary | ICD-10-CM | POA: Diagnosis not present

## 2021-07-01 DIAGNOSIS — N3946 Mixed incontinence: Secondary | ICD-10-CM | POA: Diagnosis not present

## 2021-07-01 DIAGNOSIS — E785 Hyperlipidemia, unspecified: Secondary | ICD-10-CM | POA: Diagnosis not present

## 2021-07-02 DIAGNOSIS — I509 Heart failure, unspecified: Secondary | ICD-10-CM | POA: Diagnosis not present

## 2021-07-02 DIAGNOSIS — R0602 Shortness of breath: Secondary | ICD-10-CM | POA: Diagnosis not present

## 2021-07-02 DIAGNOSIS — E785 Hyperlipidemia, unspecified: Secondary | ICD-10-CM | POA: Diagnosis not present

## 2021-07-02 DIAGNOSIS — E569 Vitamin deficiency, unspecified: Secondary | ICD-10-CM | POA: Diagnosis not present

## 2021-07-02 DIAGNOSIS — N3946 Mixed incontinence: Secondary | ICD-10-CM | POA: Diagnosis not present

## 2021-07-02 DIAGNOSIS — I4891 Unspecified atrial fibrillation: Secondary | ICD-10-CM | POA: Diagnosis not present

## 2021-07-07 ENCOUNTER — Non-Acute Institutional Stay: Payer: Medicare Other

## 2021-07-07 DIAGNOSIS — Z515 Encounter for palliative care: Secondary | ICD-10-CM

## 2021-07-07 NOTE — Progress Notes (Signed)
COMMUNITY PALLIATIVE CARE SW NOTE ? ?PATIENT NAME: CATHEY FREDENBURG ?DOB: April 14, 1928 ?MRN: [id number] ? ?PRIMARY CARE PROVIDER: Ignatius Specking, MD ? ?RESPONSIBLE PARTY:  ?Acct ID - Guarantor Home Phone Work Phone Relationship Acct Type  ?[id number] ALDENE, HENDON [phone]  Self P/F  ?   [address] [place], [place] [address]  ? ?SOCIAL WORK TELEPHONIC ENCOUNTER (11:30 am-11: 50 am)  ? ?PC SW completed a telephonic encounter with patient's son-Stephen. He advised that patient is now a resident at [facility] (Rm 103). He advised that patient needed 24 hour care and was placed there for long-term care. He advised that patient's intake is fair. She is much weaker where she is totally bedbound. She is having increased confusion. Her son advised that he thought patient was under hospice, but was not sure. SW advised him that she will confirm this with the facility. SW advised that if patient is on hospice she will be discharged from the palliative care program. He verbalized his understanding and thanked SW for the call.  ? ?*(4:05 pm) SW confirmed with facility nurse-Leslie that patient is under hospice care with [facility]. Patient to be discharged from palliative care services.  ? ? ?Clydia Llano, LCSW ? ?

## 2021-07-08 DIAGNOSIS — E569 Vitamin deficiency, unspecified: Secondary | ICD-10-CM | POA: Diagnosis not present

## 2021-07-08 DIAGNOSIS — I509 Heart failure, unspecified: Secondary | ICD-10-CM | POA: Diagnosis not present

## 2021-07-08 DIAGNOSIS — E785 Hyperlipidemia, unspecified: Secondary | ICD-10-CM | POA: Diagnosis not present

## 2021-07-08 DIAGNOSIS — N3946 Mixed incontinence: Secondary | ICD-10-CM | POA: Diagnosis not present

## 2021-07-08 DIAGNOSIS — R0602 Shortness of breath: Secondary | ICD-10-CM | POA: Diagnosis not present

## 2021-07-08 DIAGNOSIS — I4891 Unspecified atrial fibrillation: Secondary | ICD-10-CM | POA: Diagnosis not present

## 2021-07-09 DIAGNOSIS — I4891 Unspecified atrial fibrillation: Secondary | ICD-10-CM | POA: Diagnosis not present

## 2021-07-09 DIAGNOSIS — E569 Vitamin deficiency, unspecified: Secondary | ICD-10-CM | POA: Diagnosis not present

## 2021-07-09 DIAGNOSIS — E785 Hyperlipidemia, unspecified: Secondary | ICD-10-CM | POA: Diagnosis not present

## 2021-07-09 DIAGNOSIS — I509 Heart failure, unspecified: Secondary | ICD-10-CM | POA: Diagnosis not present

## 2021-07-09 DIAGNOSIS — R0602 Shortness of breath: Secondary | ICD-10-CM | POA: Diagnosis not present

## 2021-07-09 DIAGNOSIS — N3946 Mixed incontinence: Secondary | ICD-10-CM | POA: Diagnosis not present

## 2021-07-12 DIAGNOSIS — I509 Heart failure, unspecified: Secondary | ICD-10-CM | POA: Diagnosis not present

## 2021-07-12 DIAGNOSIS — I4891 Unspecified atrial fibrillation: Secondary | ICD-10-CM | POA: Diagnosis not present

## 2021-07-12 DIAGNOSIS — N3946 Mixed incontinence: Secondary | ICD-10-CM | POA: Diagnosis not present

## 2021-07-12 DIAGNOSIS — E569 Vitamin deficiency, unspecified: Secondary | ICD-10-CM | POA: Diagnosis not present

## 2021-07-12 DIAGNOSIS — R0602 Shortness of breath: Secondary | ICD-10-CM | POA: Diagnosis not present

## 2021-07-12 DIAGNOSIS — E785 Hyperlipidemia, unspecified: Secondary | ICD-10-CM | POA: Diagnosis not present

## 2021-07-15 DIAGNOSIS — E785 Hyperlipidemia, unspecified: Secondary | ICD-10-CM | POA: Diagnosis not present

## 2021-07-15 DIAGNOSIS — I509 Heart failure, unspecified: Secondary | ICD-10-CM | POA: Diagnosis not present

## 2021-07-15 DIAGNOSIS — I4891 Unspecified atrial fibrillation: Secondary | ICD-10-CM | POA: Diagnosis not present

## 2021-07-15 DIAGNOSIS — N3946 Mixed incontinence: Secondary | ICD-10-CM | POA: Diagnosis not present

## 2021-07-15 DIAGNOSIS — E569 Vitamin deficiency, unspecified: Secondary | ICD-10-CM | POA: Diagnosis not present

## 2021-07-15 DIAGNOSIS — R0602 Shortness of breath: Secondary | ICD-10-CM | POA: Diagnosis not present

## 2021-07-16 DIAGNOSIS — I509 Heart failure, unspecified: Secondary | ICD-10-CM | POA: Diagnosis not present

## 2021-07-16 DIAGNOSIS — R0602 Shortness of breath: Secondary | ICD-10-CM | POA: Diagnosis not present

## 2021-07-16 DIAGNOSIS — E785 Hyperlipidemia, unspecified: Secondary | ICD-10-CM | POA: Diagnosis not present

## 2021-07-16 DIAGNOSIS — N3946 Mixed incontinence: Secondary | ICD-10-CM | POA: Diagnosis not present

## 2021-07-16 DIAGNOSIS — I4891 Unspecified atrial fibrillation: Secondary | ICD-10-CM | POA: Diagnosis not present

## 2021-07-16 DIAGNOSIS — E569 Vitamin deficiency, unspecified: Secondary | ICD-10-CM | POA: Diagnosis not present

## 2021-07-22 DIAGNOSIS — N3946 Mixed incontinence: Secondary | ICD-10-CM | POA: Diagnosis not present

## 2021-07-22 DIAGNOSIS — E785 Hyperlipidemia, unspecified: Secondary | ICD-10-CM | POA: Diagnosis not present

## 2021-07-22 DIAGNOSIS — I509 Heart failure, unspecified: Secondary | ICD-10-CM | POA: Diagnosis not present

## 2021-07-22 DIAGNOSIS — E569 Vitamin deficiency, unspecified: Secondary | ICD-10-CM | POA: Diagnosis not present

## 2021-07-22 DIAGNOSIS — I4891 Unspecified atrial fibrillation: Secondary | ICD-10-CM | POA: Diagnosis not present

## 2021-07-22 DIAGNOSIS — R0602 Shortness of breath: Secondary | ICD-10-CM | POA: Diagnosis not present

## 2021-07-23 DIAGNOSIS — I509 Heart failure, unspecified: Secondary | ICD-10-CM | POA: Diagnosis not present

## 2021-07-23 DIAGNOSIS — E569 Vitamin deficiency, unspecified: Secondary | ICD-10-CM | POA: Diagnosis not present

## 2021-07-23 DIAGNOSIS — E785 Hyperlipidemia, unspecified: Secondary | ICD-10-CM | POA: Diagnosis not present

## 2021-07-23 DIAGNOSIS — I4891 Unspecified atrial fibrillation: Secondary | ICD-10-CM | POA: Diagnosis not present

## 2021-07-23 DIAGNOSIS — N3946 Mixed incontinence: Secondary | ICD-10-CM | POA: Diagnosis not present

## 2021-07-23 DIAGNOSIS — R0602 Shortness of breath: Secondary | ICD-10-CM | POA: Diagnosis not present

## 2021-07-29 DIAGNOSIS — E785 Hyperlipidemia, unspecified: Secondary | ICD-10-CM | POA: Diagnosis not present

## 2021-07-29 DIAGNOSIS — I509 Heart failure, unspecified: Secondary | ICD-10-CM | POA: Diagnosis not present

## 2021-07-29 DIAGNOSIS — I4891 Unspecified atrial fibrillation: Secondary | ICD-10-CM | POA: Diagnosis not present

## 2021-07-29 DIAGNOSIS — R0602 Shortness of breath: Secondary | ICD-10-CM | POA: Diagnosis not present

## 2021-07-29 DIAGNOSIS — E569 Vitamin deficiency, unspecified: Secondary | ICD-10-CM | POA: Diagnosis not present

## 2021-07-29 DIAGNOSIS — N3946 Mixed incontinence: Secondary | ICD-10-CM | POA: Diagnosis not present

## 2021-07-30 DIAGNOSIS — E785 Hyperlipidemia, unspecified: Secondary | ICD-10-CM | POA: Diagnosis not present

## 2021-07-30 DIAGNOSIS — F411 Generalized anxiety disorder: Secondary | ICD-10-CM | POA: Diagnosis not present

## 2021-07-30 DIAGNOSIS — I4891 Unspecified atrial fibrillation: Secondary | ICD-10-CM | POA: Diagnosis not present

## 2021-07-30 DIAGNOSIS — K5900 Constipation, unspecified: Secondary | ICD-10-CM | POA: Diagnosis not present

## 2021-07-30 DIAGNOSIS — N3946 Mixed incontinence: Secondary | ICD-10-CM | POA: Diagnosis not present

## 2021-07-30 DIAGNOSIS — G311 Senile degeneration of brain, not elsewhere classified: Secondary | ICD-10-CM | POA: Diagnosis not present

## 2021-07-30 DIAGNOSIS — I509 Heart failure, unspecified: Secondary | ICD-10-CM | POA: Diagnosis not present

## 2021-07-30 DIAGNOSIS — I10 Essential (primary) hypertension: Secondary | ICD-10-CM | POA: Diagnosis not present

## 2021-07-30 DIAGNOSIS — E569 Vitamin deficiency, unspecified: Secondary | ICD-10-CM | POA: Diagnosis not present

## 2021-07-30 DIAGNOSIS — R0602 Shortness of breath: Secondary | ICD-10-CM | POA: Diagnosis not present

## 2021-07-30 DIAGNOSIS — R531 Weakness: Secondary | ICD-10-CM | POA: Diagnosis not present

## 2021-08-03 DIAGNOSIS — N3946 Mixed incontinence: Secondary | ICD-10-CM | POA: Diagnosis not present

## 2021-08-03 DIAGNOSIS — E569 Vitamin deficiency, unspecified: Secondary | ICD-10-CM | POA: Diagnosis not present

## 2021-08-03 DIAGNOSIS — I509 Heart failure, unspecified: Secondary | ICD-10-CM | POA: Diagnosis not present

## 2021-08-03 DIAGNOSIS — I4891 Unspecified atrial fibrillation: Secondary | ICD-10-CM | POA: Diagnosis not present

## 2021-08-03 DIAGNOSIS — E785 Hyperlipidemia, unspecified: Secondary | ICD-10-CM | POA: Diagnosis not present

## 2021-08-03 DIAGNOSIS — R0602 Shortness of breath: Secondary | ICD-10-CM | POA: Diagnosis not present

## 2021-08-05 DIAGNOSIS — I4891 Unspecified atrial fibrillation: Secondary | ICD-10-CM | POA: Diagnosis not present

## 2021-08-05 DIAGNOSIS — E569 Vitamin deficiency, unspecified: Secondary | ICD-10-CM | POA: Diagnosis not present

## 2021-08-05 DIAGNOSIS — N3946 Mixed incontinence: Secondary | ICD-10-CM | POA: Diagnosis not present

## 2021-08-05 DIAGNOSIS — R0602 Shortness of breath: Secondary | ICD-10-CM | POA: Diagnosis not present

## 2021-08-05 DIAGNOSIS — E785 Hyperlipidemia, unspecified: Secondary | ICD-10-CM | POA: Diagnosis not present

## 2021-08-05 DIAGNOSIS — I509 Heart failure, unspecified: Secondary | ICD-10-CM | POA: Diagnosis not present

## 2021-08-10 DIAGNOSIS — R0602 Shortness of breath: Secondary | ICD-10-CM | POA: Diagnosis not present

## 2021-08-10 DIAGNOSIS — I4891 Unspecified atrial fibrillation: Secondary | ICD-10-CM | POA: Diagnosis not present

## 2021-08-10 DIAGNOSIS — E569 Vitamin deficiency, unspecified: Secondary | ICD-10-CM | POA: Diagnosis not present

## 2021-08-10 DIAGNOSIS — I509 Heart failure, unspecified: Secondary | ICD-10-CM | POA: Diagnosis not present

## 2021-08-10 DIAGNOSIS — E785 Hyperlipidemia, unspecified: Secondary | ICD-10-CM | POA: Diagnosis not present

## 2021-08-10 DIAGNOSIS — N3946 Mixed incontinence: Secondary | ICD-10-CM | POA: Diagnosis not present

## 2021-08-12 DIAGNOSIS — N3946 Mixed incontinence: Secondary | ICD-10-CM | POA: Diagnosis not present

## 2021-08-12 DIAGNOSIS — E569 Vitamin deficiency, unspecified: Secondary | ICD-10-CM | POA: Diagnosis not present

## 2021-08-12 DIAGNOSIS — R0602 Shortness of breath: Secondary | ICD-10-CM | POA: Diagnosis not present

## 2021-08-12 DIAGNOSIS — E785 Hyperlipidemia, unspecified: Secondary | ICD-10-CM | POA: Diagnosis not present

## 2021-08-12 DIAGNOSIS — I509 Heart failure, unspecified: Secondary | ICD-10-CM | POA: Diagnosis not present

## 2021-08-12 DIAGNOSIS — I4891 Unspecified atrial fibrillation: Secondary | ICD-10-CM | POA: Diagnosis not present

## 2021-08-13 DIAGNOSIS — R0602 Shortness of breath: Secondary | ICD-10-CM | POA: Diagnosis not present

## 2021-08-13 DIAGNOSIS — E569 Vitamin deficiency, unspecified: Secondary | ICD-10-CM | POA: Diagnosis not present

## 2021-08-13 DIAGNOSIS — N3946 Mixed incontinence: Secondary | ICD-10-CM | POA: Diagnosis not present

## 2021-08-13 DIAGNOSIS — E785 Hyperlipidemia, unspecified: Secondary | ICD-10-CM | POA: Diagnosis not present

## 2021-08-13 DIAGNOSIS — I509 Heart failure, unspecified: Secondary | ICD-10-CM | POA: Diagnosis not present

## 2021-08-13 DIAGNOSIS — I4891 Unspecified atrial fibrillation: Secondary | ICD-10-CM | POA: Diagnosis not present

## 2021-08-19 DIAGNOSIS — E785 Hyperlipidemia, unspecified: Secondary | ICD-10-CM | POA: Diagnosis not present

## 2021-08-19 DIAGNOSIS — N3946 Mixed incontinence: Secondary | ICD-10-CM | POA: Diagnosis not present

## 2021-08-19 DIAGNOSIS — I4891 Unspecified atrial fibrillation: Secondary | ICD-10-CM | POA: Diagnosis not present

## 2021-08-19 DIAGNOSIS — E569 Vitamin deficiency, unspecified: Secondary | ICD-10-CM | POA: Diagnosis not present

## 2021-08-19 DIAGNOSIS — R0602 Shortness of breath: Secondary | ICD-10-CM | POA: Diagnosis not present

## 2021-08-19 DIAGNOSIS — I509 Heart failure, unspecified: Secondary | ICD-10-CM | POA: Diagnosis not present

## 2021-08-22 DIAGNOSIS — I509 Heart failure, unspecified: Secondary | ICD-10-CM | POA: Diagnosis not present

## 2021-08-22 DIAGNOSIS — I4891 Unspecified atrial fibrillation: Secondary | ICD-10-CM | POA: Diagnosis not present

## 2021-08-22 DIAGNOSIS — E569 Vitamin deficiency, unspecified: Secondary | ICD-10-CM | POA: Diagnosis not present

## 2021-08-22 DIAGNOSIS — N3946 Mixed incontinence: Secondary | ICD-10-CM | POA: Diagnosis not present

## 2021-08-22 DIAGNOSIS — R0602 Shortness of breath: Secondary | ICD-10-CM | POA: Diagnosis not present

## 2021-08-22 DIAGNOSIS — E785 Hyperlipidemia, unspecified: Secondary | ICD-10-CM | POA: Diagnosis not present

## 2021-08-26 DIAGNOSIS — R0602 Shortness of breath: Secondary | ICD-10-CM | POA: Diagnosis not present

## 2021-08-26 DIAGNOSIS — I509 Heart failure, unspecified: Secondary | ICD-10-CM | POA: Diagnosis not present

## 2021-08-26 DIAGNOSIS — N3946 Mixed incontinence: Secondary | ICD-10-CM | POA: Diagnosis not present

## 2021-08-26 DIAGNOSIS — I4891 Unspecified atrial fibrillation: Secondary | ICD-10-CM | POA: Diagnosis not present

## 2021-08-26 DIAGNOSIS — E785 Hyperlipidemia, unspecified: Secondary | ICD-10-CM | POA: Diagnosis not present

## 2021-08-26 DIAGNOSIS — E569 Vitamin deficiency, unspecified: Secondary | ICD-10-CM | POA: Diagnosis not present

## 2021-08-27 DIAGNOSIS — I10 Essential (primary) hypertension: Secondary | ICD-10-CM | POA: Diagnosis not present

## 2021-08-27 DIAGNOSIS — Z299 Encounter for prophylactic measures, unspecified: Secondary | ICD-10-CM | POA: Diagnosis not present

## 2021-08-27 DIAGNOSIS — Z515 Encounter for palliative care: Secondary | ICD-10-CM | POA: Diagnosis not present

## 2021-08-27 DIAGNOSIS — I509 Heart failure, unspecified: Secondary | ICD-10-CM | POA: Diagnosis not present

## 2021-08-27 DIAGNOSIS — R0602 Shortness of breath: Secondary | ICD-10-CM | POA: Diagnosis not present

## 2021-08-27 DIAGNOSIS — R532 Functional quadriplegia: Secondary | ICD-10-CM | POA: Diagnosis not present

## 2021-08-27 DIAGNOSIS — M179 Osteoarthritis of knee, unspecified: Secondary | ICD-10-CM | POA: Diagnosis not present

## 2021-08-27 DIAGNOSIS — E785 Hyperlipidemia, unspecified: Secondary | ICD-10-CM | POA: Diagnosis not present

## 2021-08-27 DIAGNOSIS — I4891 Unspecified atrial fibrillation: Secondary | ICD-10-CM | POA: Diagnosis not present

## 2021-08-27 DIAGNOSIS — N3946 Mixed incontinence: Secondary | ICD-10-CM | POA: Diagnosis not present

## 2021-08-27 DIAGNOSIS — E569 Vitamin deficiency, unspecified: Secondary | ICD-10-CM | POA: Diagnosis not present

## 2021-08-29 DIAGNOSIS — F411 Generalized anxiety disorder: Secondary | ICD-10-CM | POA: Diagnosis not present

## 2021-08-29 DIAGNOSIS — R0602 Shortness of breath: Secondary | ICD-10-CM | POA: Diagnosis not present

## 2021-08-29 DIAGNOSIS — I4891 Unspecified atrial fibrillation: Secondary | ICD-10-CM | POA: Diagnosis not present

## 2021-08-29 DIAGNOSIS — K5900 Constipation, unspecified: Secondary | ICD-10-CM | POA: Diagnosis not present

## 2021-08-29 DIAGNOSIS — I509 Heart failure, unspecified: Secondary | ICD-10-CM | POA: Diagnosis not present

## 2021-08-29 DIAGNOSIS — R531 Weakness: Secondary | ICD-10-CM | POA: Diagnosis not present

## 2021-08-29 DIAGNOSIS — E785 Hyperlipidemia, unspecified: Secondary | ICD-10-CM | POA: Diagnosis not present

## 2021-08-29 DIAGNOSIS — I10 Essential (primary) hypertension: Secondary | ICD-10-CM | POA: Diagnosis not present

## 2021-08-29 DIAGNOSIS — N3946 Mixed incontinence: Secondary | ICD-10-CM | POA: Diagnosis not present

## 2021-08-29 DIAGNOSIS — E569 Vitamin deficiency, unspecified: Secondary | ICD-10-CM | POA: Diagnosis not present

## 2021-08-29 DIAGNOSIS — G311 Senile degeneration of brain, not elsewhere classified: Secondary | ICD-10-CM | POA: Diagnosis not present

## 2021-08-31 DIAGNOSIS — E785 Hyperlipidemia, unspecified: Secondary | ICD-10-CM | POA: Diagnosis not present

## 2021-08-31 DIAGNOSIS — N3946 Mixed incontinence: Secondary | ICD-10-CM | POA: Diagnosis not present

## 2021-08-31 DIAGNOSIS — R0602 Shortness of breath: Secondary | ICD-10-CM | POA: Diagnosis not present

## 2021-08-31 DIAGNOSIS — I509 Heart failure, unspecified: Secondary | ICD-10-CM | POA: Diagnosis not present

## 2021-08-31 DIAGNOSIS — I4891 Unspecified atrial fibrillation: Secondary | ICD-10-CM | POA: Diagnosis not present

## 2021-08-31 DIAGNOSIS — E569 Vitamin deficiency, unspecified: Secondary | ICD-10-CM | POA: Diagnosis not present

## 2021-09-02 DIAGNOSIS — E569 Vitamin deficiency, unspecified: Secondary | ICD-10-CM | POA: Diagnosis not present

## 2021-09-02 DIAGNOSIS — E785 Hyperlipidemia, unspecified: Secondary | ICD-10-CM | POA: Diagnosis not present

## 2021-09-02 DIAGNOSIS — I4891 Unspecified atrial fibrillation: Secondary | ICD-10-CM | POA: Diagnosis not present

## 2021-09-02 DIAGNOSIS — I509 Heart failure, unspecified: Secondary | ICD-10-CM | POA: Diagnosis not present

## 2021-09-02 DIAGNOSIS — N3946 Mixed incontinence: Secondary | ICD-10-CM | POA: Diagnosis not present

## 2021-09-02 DIAGNOSIS — R0602 Shortness of breath: Secondary | ICD-10-CM | POA: Diagnosis not present

## 2021-09-09 DIAGNOSIS — E785 Hyperlipidemia, unspecified: Secondary | ICD-10-CM | POA: Diagnosis not present

## 2021-09-09 DIAGNOSIS — I4891 Unspecified atrial fibrillation: Secondary | ICD-10-CM | POA: Diagnosis not present

## 2021-09-09 DIAGNOSIS — R0602 Shortness of breath: Secondary | ICD-10-CM | POA: Diagnosis not present

## 2021-09-09 DIAGNOSIS — I509 Heart failure, unspecified: Secondary | ICD-10-CM | POA: Diagnosis not present

## 2021-09-09 DIAGNOSIS — E569 Vitamin deficiency, unspecified: Secondary | ICD-10-CM | POA: Diagnosis not present

## 2021-09-09 DIAGNOSIS — N3946 Mixed incontinence: Secondary | ICD-10-CM | POA: Diagnosis not present

## 2021-09-10 DIAGNOSIS — I509 Heart failure, unspecified: Secondary | ICD-10-CM | POA: Diagnosis not present

## 2021-09-10 DIAGNOSIS — N3946 Mixed incontinence: Secondary | ICD-10-CM | POA: Diagnosis not present

## 2021-09-10 DIAGNOSIS — E569 Vitamin deficiency, unspecified: Secondary | ICD-10-CM | POA: Diagnosis not present

## 2021-09-10 DIAGNOSIS — I4891 Unspecified atrial fibrillation: Secondary | ICD-10-CM | POA: Diagnosis not present

## 2021-09-10 DIAGNOSIS — E785 Hyperlipidemia, unspecified: Secondary | ICD-10-CM | POA: Diagnosis not present

## 2021-09-10 DIAGNOSIS — R0602 Shortness of breath: Secondary | ICD-10-CM | POA: Diagnosis not present

## 2021-09-11 DIAGNOSIS — R0602 Shortness of breath: Secondary | ICD-10-CM | POA: Diagnosis not present

## 2021-09-11 DIAGNOSIS — E785 Hyperlipidemia, unspecified: Secondary | ICD-10-CM | POA: Diagnosis not present

## 2021-09-11 DIAGNOSIS — I4891 Unspecified atrial fibrillation: Secondary | ICD-10-CM | POA: Diagnosis not present

## 2021-09-11 DIAGNOSIS — I509 Heart failure, unspecified: Secondary | ICD-10-CM | POA: Diagnosis not present

## 2021-09-11 DIAGNOSIS — E569 Vitamin deficiency, unspecified: Secondary | ICD-10-CM | POA: Diagnosis not present

## 2021-09-11 DIAGNOSIS — N3946 Mixed incontinence: Secondary | ICD-10-CM | POA: Diagnosis not present

## 2021-09-13 DIAGNOSIS — R0602 Shortness of breath: Secondary | ICD-10-CM | POA: Diagnosis not present

## 2021-09-13 DIAGNOSIS — E785 Hyperlipidemia, unspecified: Secondary | ICD-10-CM | POA: Diagnosis not present

## 2021-09-13 DIAGNOSIS — I4891 Unspecified atrial fibrillation: Secondary | ICD-10-CM | POA: Diagnosis not present

## 2021-09-13 DIAGNOSIS — N3946 Mixed incontinence: Secondary | ICD-10-CM | POA: Diagnosis not present

## 2021-09-13 DIAGNOSIS — I509 Heart failure, unspecified: Secondary | ICD-10-CM | POA: Diagnosis not present

## 2021-09-13 DIAGNOSIS — E569 Vitamin deficiency, unspecified: Secondary | ICD-10-CM | POA: Diagnosis not present

## 2021-09-14 DIAGNOSIS — I509 Heart failure, unspecified: Secondary | ICD-10-CM | POA: Diagnosis not present

## 2021-09-14 DIAGNOSIS — E785 Hyperlipidemia, unspecified: Secondary | ICD-10-CM | POA: Diagnosis not present

## 2021-09-14 DIAGNOSIS — N3946 Mixed incontinence: Secondary | ICD-10-CM | POA: Diagnosis not present

## 2021-09-14 DIAGNOSIS — I4891 Unspecified atrial fibrillation: Secondary | ICD-10-CM | POA: Diagnosis not present

## 2021-09-14 DIAGNOSIS — E569 Vitamin deficiency, unspecified: Secondary | ICD-10-CM | POA: Diagnosis not present

## 2021-09-14 DIAGNOSIS — R0602 Shortness of breath: Secondary | ICD-10-CM | POA: Diagnosis not present

## 2021-09-16 DIAGNOSIS — N3946 Mixed incontinence: Secondary | ICD-10-CM | POA: Diagnosis not present

## 2021-09-16 DIAGNOSIS — F4321 Adjustment disorder with depressed mood: Secondary | ICD-10-CM | POA: Diagnosis not present

## 2021-09-16 DIAGNOSIS — E785 Hyperlipidemia, unspecified: Secondary | ICD-10-CM | POA: Diagnosis not present

## 2021-09-16 DIAGNOSIS — I509 Heart failure, unspecified: Secondary | ICD-10-CM | POA: Diagnosis not present

## 2021-09-16 DIAGNOSIS — F03C0 Unspecified dementia, severe, without behavioral disturbance, psychotic disturbance, mood disturbance, and anxiety: Secondary | ICD-10-CM | POA: Diagnosis not present

## 2021-09-16 DIAGNOSIS — R0602 Shortness of breath: Secondary | ICD-10-CM | POA: Diagnosis not present

## 2021-09-16 DIAGNOSIS — E569 Vitamin deficiency, unspecified: Secondary | ICD-10-CM | POA: Diagnosis not present

## 2021-09-16 DIAGNOSIS — I4891 Unspecified atrial fibrillation: Secondary | ICD-10-CM | POA: Diagnosis not present

## 2021-09-16 DIAGNOSIS — F32A Depression, unspecified: Secondary | ICD-10-CM | POA: Diagnosis not present

## 2021-09-16 DIAGNOSIS — F413 Other mixed anxiety disorders: Secondary | ICD-10-CM | POA: Diagnosis not present

## 2021-09-23 DIAGNOSIS — N3946 Mixed incontinence: Secondary | ICD-10-CM | POA: Diagnosis not present

## 2021-09-23 DIAGNOSIS — E785 Hyperlipidemia, unspecified: Secondary | ICD-10-CM | POA: Diagnosis not present

## 2021-09-23 DIAGNOSIS — E569 Vitamin deficiency, unspecified: Secondary | ICD-10-CM | POA: Diagnosis not present

## 2021-09-23 DIAGNOSIS — I4891 Unspecified atrial fibrillation: Secondary | ICD-10-CM | POA: Diagnosis not present

## 2021-09-23 DIAGNOSIS — I509 Heart failure, unspecified: Secondary | ICD-10-CM | POA: Diagnosis not present

## 2021-09-23 DIAGNOSIS — R0602 Shortness of breath: Secondary | ICD-10-CM | POA: Diagnosis not present

## 2021-09-24 DIAGNOSIS — N3946 Mixed incontinence: Secondary | ICD-10-CM | POA: Diagnosis not present

## 2021-09-24 DIAGNOSIS — I4891 Unspecified atrial fibrillation: Secondary | ICD-10-CM | POA: Diagnosis not present

## 2021-09-24 DIAGNOSIS — F419 Anxiety disorder, unspecified: Secondary | ICD-10-CM | POA: Diagnosis not present

## 2021-09-24 DIAGNOSIS — R532 Functional quadriplegia: Secondary | ICD-10-CM | POA: Diagnosis not present

## 2021-09-24 DIAGNOSIS — I10 Essential (primary) hypertension: Secondary | ICD-10-CM | POA: Diagnosis not present

## 2021-09-24 DIAGNOSIS — F0390 Unspecified dementia without behavioral disturbance: Secondary | ICD-10-CM | POA: Diagnosis not present

## 2021-09-24 DIAGNOSIS — Z299 Encounter for prophylactic measures, unspecified: Secondary | ICD-10-CM | POA: Diagnosis not present

## 2021-09-24 DIAGNOSIS — E569 Vitamin deficiency, unspecified: Secondary | ICD-10-CM | POA: Diagnosis not present

## 2021-09-24 DIAGNOSIS — I509 Heart failure, unspecified: Secondary | ICD-10-CM | POA: Diagnosis not present

## 2021-09-24 DIAGNOSIS — R0602 Shortness of breath: Secondary | ICD-10-CM | POA: Diagnosis not present

## 2021-09-24 DIAGNOSIS — E785 Hyperlipidemia, unspecified: Secondary | ICD-10-CM | POA: Diagnosis not present

## 2021-09-29 DIAGNOSIS — I10 Essential (primary) hypertension: Secondary | ICD-10-CM | POA: Diagnosis not present

## 2021-09-29 DIAGNOSIS — I509 Heart failure, unspecified: Secondary | ICD-10-CM | POA: Diagnosis not present

## 2021-09-29 DIAGNOSIS — F411 Generalized anxiety disorder: Secondary | ICD-10-CM | POA: Diagnosis not present

## 2021-09-29 DIAGNOSIS — N3946 Mixed incontinence: Secondary | ICD-10-CM | POA: Diagnosis not present

## 2021-09-29 DIAGNOSIS — R0602 Shortness of breath: Secondary | ICD-10-CM | POA: Diagnosis not present

## 2021-09-29 DIAGNOSIS — E569 Vitamin deficiency, unspecified: Secondary | ICD-10-CM | POA: Diagnosis not present

## 2021-09-29 DIAGNOSIS — I4891 Unspecified atrial fibrillation: Secondary | ICD-10-CM | POA: Diagnosis not present

## 2021-09-29 DIAGNOSIS — G311 Senile degeneration of brain, not elsewhere classified: Secondary | ICD-10-CM | POA: Diagnosis not present

## 2021-09-29 DIAGNOSIS — E785 Hyperlipidemia, unspecified: Secondary | ICD-10-CM | POA: Diagnosis not present

## 2021-09-29 DIAGNOSIS — R531 Weakness: Secondary | ICD-10-CM | POA: Diagnosis not present

## 2021-09-29 DIAGNOSIS — K5900 Constipation, unspecified: Secondary | ICD-10-CM | POA: Diagnosis not present

## 2021-09-30 DIAGNOSIS — E569 Vitamin deficiency, unspecified: Secondary | ICD-10-CM | POA: Diagnosis not present

## 2021-09-30 DIAGNOSIS — I509 Heart failure, unspecified: Secondary | ICD-10-CM | POA: Diagnosis not present

## 2021-09-30 DIAGNOSIS — N3946 Mixed incontinence: Secondary | ICD-10-CM | POA: Diagnosis not present

## 2021-09-30 DIAGNOSIS — E785 Hyperlipidemia, unspecified: Secondary | ICD-10-CM | POA: Diagnosis not present

## 2021-09-30 DIAGNOSIS — I4891 Unspecified atrial fibrillation: Secondary | ICD-10-CM | POA: Diagnosis not present

## 2021-09-30 DIAGNOSIS — R0602 Shortness of breath: Secondary | ICD-10-CM | POA: Diagnosis not present

## 2021-10-01 DIAGNOSIS — R0602 Shortness of breath: Secondary | ICD-10-CM | POA: Diagnosis not present

## 2021-10-01 DIAGNOSIS — N3946 Mixed incontinence: Secondary | ICD-10-CM | POA: Diagnosis not present

## 2021-10-01 DIAGNOSIS — E569 Vitamin deficiency, unspecified: Secondary | ICD-10-CM | POA: Diagnosis not present

## 2021-10-01 DIAGNOSIS — I4891 Unspecified atrial fibrillation: Secondary | ICD-10-CM | POA: Diagnosis not present

## 2021-10-01 DIAGNOSIS — I509 Heart failure, unspecified: Secondary | ICD-10-CM | POA: Diagnosis not present

## 2021-10-01 DIAGNOSIS — E785 Hyperlipidemia, unspecified: Secondary | ICD-10-CM | POA: Diagnosis not present

## 2021-10-05 DIAGNOSIS — E785 Hyperlipidemia, unspecified: Secondary | ICD-10-CM | POA: Diagnosis not present

## 2021-10-05 DIAGNOSIS — R0602 Shortness of breath: Secondary | ICD-10-CM | POA: Diagnosis not present

## 2021-10-05 DIAGNOSIS — N3946 Mixed incontinence: Secondary | ICD-10-CM | POA: Diagnosis not present

## 2021-10-05 DIAGNOSIS — I509 Heart failure, unspecified: Secondary | ICD-10-CM | POA: Diagnosis not present

## 2021-10-05 DIAGNOSIS — I4891 Unspecified atrial fibrillation: Secondary | ICD-10-CM | POA: Diagnosis not present

## 2021-10-05 DIAGNOSIS — E569 Vitamin deficiency, unspecified: Secondary | ICD-10-CM | POA: Diagnosis not present

## 2021-10-07 DIAGNOSIS — E785 Hyperlipidemia, unspecified: Secondary | ICD-10-CM | POA: Diagnosis not present

## 2021-10-07 DIAGNOSIS — I509 Heart failure, unspecified: Secondary | ICD-10-CM | POA: Diagnosis not present

## 2021-10-07 DIAGNOSIS — N3946 Mixed incontinence: Secondary | ICD-10-CM | POA: Diagnosis not present

## 2021-10-07 DIAGNOSIS — R0602 Shortness of breath: Secondary | ICD-10-CM | POA: Diagnosis not present

## 2021-10-07 DIAGNOSIS — E569 Vitamin deficiency, unspecified: Secondary | ICD-10-CM | POA: Diagnosis not present

## 2021-10-07 DIAGNOSIS — I4891 Unspecified atrial fibrillation: Secondary | ICD-10-CM | POA: Diagnosis not present

## 2021-10-08 DIAGNOSIS — I4891 Unspecified atrial fibrillation: Secondary | ICD-10-CM | POA: Diagnosis not present

## 2021-10-08 DIAGNOSIS — E569 Vitamin deficiency, unspecified: Secondary | ICD-10-CM | POA: Diagnosis not present

## 2021-10-08 DIAGNOSIS — R0602 Shortness of breath: Secondary | ICD-10-CM | POA: Diagnosis not present

## 2021-10-08 DIAGNOSIS — I509 Heart failure, unspecified: Secondary | ICD-10-CM | POA: Diagnosis not present

## 2021-10-08 DIAGNOSIS — N3946 Mixed incontinence: Secondary | ICD-10-CM | POA: Diagnosis not present

## 2021-10-08 DIAGNOSIS — E785 Hyperlipidemia, unspecified: Secondary | ICD-10-CM | POA: Diagnosis not present

## 2021-10-14 DIAGNOSIS — F32A Depression, unspecified: Secondary | ICD-10-CM | POA: Diagnosis not present

## 2021-10-14 DIAGNOSIS — R0602 Shortness of breath: Secondary | ICD-10-CM | POA: Diagnosis not present

## 2021-10-14 DIAGNOSIS — F03C0 Unspecified dementia, severe, without behavioral disturbance, psychotic disturbance, mood disturbance, and anxiety: Secondary | ICD-10-CM | POA: Diagnosis not present

## 2021-10-14 DIAGNOSIS — E569 Vitamin deficiency, unspecified: Secondary | ICD-10-CM | POA: Diagnosis not present

## 2021-10-14 DIAGNOSIS — Z515 Encounter for palliative care: Secondary | ICD-10-CM | POA: Diagnosis not present

## 2021-10-14 DIAGNOSIS — N3946 Mixed incontinence: Secondary | ICD-10-CM | POA: Diagnosis not present

## 2021-10-14 DIAGNOSIS — I4891 Unspecified atrial fibrillation: Secondary | ICD-10-CM | POA: Diagnosis not present

## 2021-10-14 DIAGNOSIS — E785 Hyperlipidemia, unspecified: Secondary | ICD-10-CM | POA: Diagnosis not present

## 2021-10-14 DIAGNOSIS — F413 Other mixed anxiety disorders: Secondary | ICD-10-CM | POA: Diagnosis not present

## 2021-10-14 DIAGNOSIS — I509 Heart failure, unspecified: Secondary | ICD-10-CM | POA: Diagnosis not present

## 2021-10-15 DIAGNOSIS — E569 Vitamin deficiency, unspecified: Secondary | ICD-10-CM | POA: Diagnosis not present

## 2021-10-15 DIAGNOSIS — E785 Hyperlipidemia, unspecified: Secondary | ICD-10-CM | POA: Diagnosis not present

## 2021-10-15 DIAGNOSIS — I509 Heart failure, unspecified: Secondary | ICD-10-CM | POA: Diagnosis not present

## 2021-10-15 DIAGNOSIS — N3946 Mixed incontinence: Secondary | ICD-10-CM | POA: Diagnosis not present

## 2021-10-15 DIAGNOSIS — R0602 Shortness of breath: Secondary | ICD-10-CM | POA: Diagnosis not present

## 2021-10-15 DIAGNOSIS — I4891 Unspecified atrial fibrillation: Secondary | ICD-10-CM | POA: Diagnosis not present

## 2021-10-19 DIAGNOSIS — N3946 Mixed incontinence: Secondary | ICD-10-CM | POA: Diagnosis not present

## 2021-10-19 DIAGNOSIS — R0602 Shortness of breath: Secondary | ICD-10-CM | POA: Diagnosis not present

## 2021-10-19 DIAGNOSIS — E569 Vitamin deficiency, unspecified: Secondary | ICD-10-CM | POA: Diagnosis not present

## 2021-10-19 DIAGNOSIS — I509 Heart failure, unspecified: Secondary | ICD-10-CM | POA: Diagnosis not present

## 2021-10-19 DIAGNOSIS — I4891 Unspecified atrial fibrillation: Secondary | ICD-10-CM | POA: Diagnosis not present

## 2021-10-19 DIAGNOSIS — E785 Hyperlipidemia, unspecified: Secondary | ICD-10-CM | POA: Diagnosis not present

## 2021-10-21 DIAGNOSIS — E785 Hyperlipidemia, unspecified: Secondary | ICD-10-CM | POA: Diagnosis not present

## 2021-10-21 DIAGNOSIS — I509 Heart failure, unspecified: Secondary | ICD-10-CM | POA: Diagnosis not present

## 2021-10-21 DIAGNOSIS — N3946 Mixed incontinence: Secondary | ICD-10-CM | POA: Diagnosis not present

## 2021-10-21 DIAGNOSIS — R0602 Shortness of breath: Secondary | ICD-10-CM | POA: Diagnosis not present

## 2021-10-21 DIAGNOSIS — I4891 Unspecified atrial fibrillation: Secondary | ICD-10-CM | POA: Diagnosis not present

## 2021-10-21 DIAGNOSIS — E569 Vitamin deficiency, unspecified: Secondary | ICD-10-CM | POA: Diagnosis not present

## 2021-10-22 DIAGNOSIS — E569 Vitamin deficiency, unspecified: Secondary | ICD-10-CM | POA: Diagnosis not present

## 2021-10-22 DIAGNOSIS — I4891 Unspecified atrial fibrillation: Secondary | ICD-10-CM | POA: Diagnosis not present

## 2021-10-22 DIAGNOSIS — I509 Heart failure, unspecified: Secondary | ICD-10-CM | POA: Diagnosis not present

## 2021-10-22 DIAGNOSIS — R0602 Shortness of breath: Secondary | ICD-10-CM | POA: Diagnosis not present

## 2021-10-22 DIAGNOSIS — N3946 Mixed incontinence: Secondary | ICD-10-CM | POA: Diagnosis not present

## 2021-10-22 DIAGNOSIS — E785 Hyperlipidemia, unspecified: Secondary | ICD-10-CM | POA: Diagnosis not present

## 2021-10-23 DIAGNOSIS — E569 Vitamin deficiency, unspecified: Secondary | ICD-10-CM | POA: Diagnosis not present

## 2021-10-23 DIAGNOSIS — R0602 Shortness of breath: Secondary | ICD-10-CM | POA: Diagnosis not present

## 2021-10-23 DIAGNOSIS — I4891 Unspecified atrial fibrillation: Secondary | ICD-10-CM | POA: Diagnosis not present

## 2021-10-23 DIAGNOSIS — N3946 Mixed incontinence: Secondary | ICD-10-CM | POA: Diagnosis not present

## 2021-10-23 DIAGNOSIS — E785 Hyperlipidemia, unspecified: Secondary | ICD-10-CM | POA: Diagnosis not present

## 2021-10-23 DIAGNOSIS — I509 Heart failure, unspecified: Secondary | ICD-10-CM | POA: Diagnosis not present

## 2021-10-25 DIAGNOSIS — R0602 Shortness of breath: Secondary | ICD-10-CM | POA: Diagnosis not present

## 2021-10-25 DIAGNOSIS — I509 Heart failure, unspecified: Secondary | ICD-10-CM | POA: Diagnosis not present

## 2021-10-25 DIAGNOSIS — E785 Hyperlipidemia, unspecified: Secondary | ICD-10-CM | POA: Diagnosis not present

## 2021-10-25 DIAGNOSIS — E569 Vitamin deficiency, unspecified: Secondary | ICD-10-CM | POA: Diagnosis not present

## 2021-10-25 DIAGNOSIS — N3946 Mixed incontinence: Secondary | ICD-10-CM | POA: Diagnosis not present

## 2021-10-25 DIAGNOSIS — I4891 Unspecified atrial fibrillation: Secondary | ICD-10-CM | POA: Diagnosis not present

## 2021-10-26 DIAGNOSIS — N3946 Mixed incontinence: Secondary | ICD-10-CM | POA: Diagnosis not present

## 2021-10-26 DIAGNOSIS — I4891 Unspecified atrial fibrillation: Secondary | ICD-10-CM | POA: Diagnosis not present

## 2021-10-26 DIAGNOSIS — E569 Vitamin deficiency, unspecified: Secondary | ICD-10-CM | POA: Diagnosis not present

## 2021-10-26 DIAGNOSIS — E785 Hyperlipidemia, unspecified: Secondary | ICD-10-CM | POA: Diagnosis not present

## 2021-10-26 DIAGNOSIS — R0602 Shortness of breath: Secondary | ICD-10-CM | POA: Diagnosis not present

## 2021-10-26 DIAGNOSIS — I509 Heart failure, unspecified: Secondary | ICD-10-CM | POA: Diagnosis not present

## 2021-10-27 DIAGNOSIS — D6869 Other thrombophilia: Secondary | ICD-10-CM | POA: Diagnosis not present

## 2021-10-27 DIAGNOSIS — R532 Functional quadriplegia: Secondary | ICD-10-CM | POA: Diagnosis not present

## 2021-10-27 DIAGNOSIS — I739 Peripheral vascular disease, unspecified: Secondary | ICD-10-CM | POA: Diagnosis not present

## 2021-10-27 DIAGNOSIS — Z299 Encounter for prophylactic measures, unspecified: Secondary | ICD-10-CM | POA: Diagnosis not present

## 2021-10-27 DIAGNOSIS — D692 Other nonthrombocytopenic purpura: Secondary | ICD-10-CM | POA: Diagnosis not present

## 2021-10-28 DIAGNOSIS — E569 Vitamin deficiency, unspecified: Secondary | ICD-10-CM | POA: Diagnosis not present

## 2021-10-28 DIAGNOSIS — R0602 Shortness of breath: Secondary | ICD-10-CM | POA: Diagnosis not present

## 2021-10-28 DIAGNOSIS — I509 Heart failure, unspecified: Secondary | ICD-10-CM | POA: Diagnosis not present

## 2021-10-28 DIAGNOSIS — E785 Hyperlipidemia, unspecified: Secondary | ICD-10-CM | POA: Diagnosis not present

## 2021-10-28 DIAGNOSIS — N3946 Mixed incontinence: Secondary | ICD-10-CM | POA: Diagnosis not present

## 2021-10-28 DIAGNOSIS — I4891 Unspecified atrial fibrillation: Secondary | ICD-10-CM | POA: Diagnosis not present

## 2021-10-29 DIAGNOSIS — E569 Vitamin deficiency, unspecified: Secondary | ICD-10-CM | POA: Diagnosis not present

## 2021-10-29 DIAGNOSIS — N3946 Mixed incontinence: Secondary | ICD-10-CM | POA: Diagnosis not present

## 2021-10-29 DIAGNOSIS — R0602 Shortness of breath: Secondary | ICD-10-CM | POA: Diagnosis not present

## 2021-10-29 DIAGNOSIS — I509 Heart failure, unspecified: Secondary | ICD-10-CM | POA: Diagnosis not present

## 2021-10-29 DIAGNOSIS — I4891 Unspecified atrial fibrillation: Secondary | ICD-10-CM | POA: Diagnosis not present

## 2021-10-29 DIAGNOSIS — E785 Hyperlipidemia, unspecified: Secondary | ICD-10-CM | POA: Diagnosis not present

## 2021-10-30 DIAGNOSIS — I10 Essential (primary) hypertension: Secondary | ICD-10-CM | POA: Diagnosis not present

## 2021-10-30 DIAGNOSIS — G311 Senile degeneration of brain, not elsewhere classified: Secondary | ICD-10-CM | POA: Diagnosis not present

## 2021-10-30 DIAGNOSIS — F411 Generalized anxiety disorder: Secondary | ICD-10-CM | POA: Diagnosis not present

## 2021-10-30 DIAGNOSIS — N3946 Mixed incontinence: Secondary | ICD-10-CM | POA: Diagnosis not present

## 2021-10-30 DIAGNOSIS — I4891 Unspecified atrial fibrillation: Secondary | ICD-10-CM | POA: Diagnosis not present

## 2021-10-30 DIAGNOSIS — I509 Heart failure, unspecified: Secondary | ICD-10-CM | POA: Diagnosis not present

## 2021-10-30 DIAGNOSIS — R0602 Shortness of breath: Secondary | ICD-10-CM | POA: Diagnosis not present

## 2021-10-30 DIAGNOSIS — K5900 Constipation, unspecified: Secondary | ICD-10-CM | POA: Diagnosis not present

## 2021-10-30 DIAGNOSIS — E569 Vitamin deficiency, unspecified: Secondary | ICD-10-CM | POA: Diagnosis not present

## 2021-10-30 DIAGNOSIS — R531 Weakness: Secondary | ICD-10-CM | POA: Diagnosis not present

## 2021-10-30 DIAGNOSIS — E785 Hyperlipidemia, unspecified: Secondary | ICD-10-CM | POA: Diagnosis not present

## 2021-10-31 DIAGNOSIS — R0602 Shortness of breath: Secondary | ICD-10-CM | POA: Diagnosis not present

## 2021-10-31 DIAGNOSIS — E785 Hyperlipidemia, unspecified: Secondary | ICD-10-CM | POA: Diagnosis not present

## 2021-10-31 DIAGNOSIS — N3946 Mixed incontinence: Secondary | ICD-10-CM | POA: Diagnosis not present

## 2021-10-31 DIAGNOSIS — I4891 Unspecified atrial fibrillation: Secondary | ICD-10-CM | POA: Diagnosis not present

## 2021-10-31 DIAGNOSIS — I509 Heart failure, unspecified: Secondary | ICD-10-CM | POA: Diagnosis not present

## 2021-10-31 DIAGNOSIS — E569 Vitamin deficiency, unspecified: Secondary | ICD-10-CM | POA: Diagnosis not present

## 2021-11-01 DIAGNOSIS — E785 Hyperlipidemia, unspecified: Secondary | ICD-10-CM | POA: Diagnosis not present

## 2021-11-01 DIAGNOSIS — N3946 Mixed incontinence: Secondary | ICD-10-CM | POA: Diagnosis not present

## 2021-11-01 DIAGNOSIS — E569 Vitamin deficiency, unspecified: Secondary | ICD-10-CM | POA: Diagnosis not present

## 2021-11-01 DIAGNOSIS — I4891 Unspecified atrial fibrillation: Secondary | ICD-10-CM | POA: Diagnosis not present

## 2021-11-01 DIAGNOSIS — R0602 Shortness of breath: Secondary | ICD-10-CM | POA: Diagnosis not present

## 2021-11-01 DIAGNOSIS — I509 Heart failure, unspecified: Secondary | ICD-10-CM | POA: Diagnosis not present

## 2021-11-02 DIAGNOSIS — R509 Fever, unspecified: Secondary | ICD-10-CM | POA: Diagnosis not present

## 2021-11-03 DIAGNOSIS — I4891 Unspecified atrial fibrillation: Secondary | ICD-10-CM | POA: Diagnosis not present

## 2021-11-03 DIAGNOSIS — I10 Essential (primary) hypertension: Secondary | ICD-10-CM | POA: Diagnosis not present

## 2021-11-03 DIAGNOSIS — R0602 Shortness of breath: Secondary | ICD-10-CM | POA: Diagnosis not present

## 2021-11-03 DIAGNOSIS — E785 Hyperlipidemia, unspecified: Secondary | ICD-10-CM | POA: Diagnosis not present

## 2021-11-03 DIAGNOSIS — E569 Vitamin deficiency, unspecified: Secondary | ICD-10-CM | POA: Diagnosis not present

## 2021-11-03 DIAGNOSIS — I509 Heart failure, unspecified: Secondary | ICD-10-CM | POA: Diagnosis not present

## 2021-11-03 DIAGNOSIS — J069 Acute upper respiratory infection, unspecified: Secondary | ICD-10-CM | POA: Diagnosis not present

## 2021-11-03 DIAGNOSIS — Z299 Encounter for prophylactic measures, unspecified: Secondary | ICD-10-CM | POA: Diagnosis not present

## 2021-11-03 DIAGNOSIS — N3946 Mixed incontinence: Secondary | ICD-10-CM | POA: Diagnosis not present

## 2021-11-03 DIAGNOSIS — R532 Functional quadriplegia: Secondary | ICD-10-CM | POA: Diagnosis not present

## 2021-11-04 DIAGNOSIS — I4891 Unspecified atrial fibrillation: Secondary | ICD-10-CM | POA: Diagnosis not present

## 2021-11-04 DIAGNOSIS — E785 Hyperlipidemia, unspecified: Secondary | ICD-10-CM | POA: Diagnosis not present

## 2021-11-04 DIAGNOSIS — N3946 Mixed incontinence: Secondary | ICD-10-CM | POA: Diagnosis not present

## 2021-11-04 DIAGNOSIS — R0602 Shortness of breath: Secondary | ICD-10-CM | POA: Diagnosis not present

## 2021-11-04 DIAGNOSIS — I509 Heart failure, unspecified: Secondary | ICD-10-CM | POA: Diagnosis not present

## 2021-11-04 DIAGNOSIS — E569 Vitamin deficiency, unspecified: Secondary | ICD-10-CM | POA: Diagnosis not present

## 2021-11-05 DIAGNOSIS — Z23 Encounter for immunization: Secondary | ICD-10-CM | POA: Diagnosis not present

## 2021-11-05 DIAGNOSIS — E785 Hyperlipidemia, unspecified: Secondary | ICD-10-CM | POA: Diagnosis not present

## 2021-11-05 DIAGNOSIS — I509 Heart failure, unspecified: Secondary | ICD-10-CM | POA: Diagnosis not present

## 2021-11-05 DIAGNOSIS — E569 Vitamin deficiency, unspecified: Secondary | ICD-10-CM | POA: Diagnosis not present

## 2021-11-05 DIAGNOSIS — R0602 Shortness of breath: Secondary | ICD-10-CM | POA: Diagnosis not present

## 2021-11-05 DIAGNOSIS — I4891 Unspecified atrial fibrillation: Secondary | ICD-10-CM | POA: Diagnosis not present

## 2021-11-05 DIAGNOSIS — N3946 Mixed incontinence: Secondary | ICD-10-CM | POA: Diagnosis not present

## 2021-11-07 DIAGNOSIS — I509 Heart failure, unspecified: Secondary | ICD-10-CM | POA: Diagnosis not present

## 2021-11-07 DIAGNOSIS — N3946 Mixed incontinence: Secondary | ICD-10-CM | POA: Diagnosis not present

## 2021-11-07 DIAGNOSIS — E569 Vitamin deficiency, unspecified: Secondary | ICD-10-CM | POA: Diagnosis not present

## 2021-11-07 DIAGNOSIS — R0602 Shortness of breath: Secondary | ICD-10-CM | POA: Diagnosis not present

## 2021-11-07 DIAGNOSIS — E785 Hyperlipidemia, unspecified: Secondary | ICD-10-CM | POA: Diagnosis not present

## 2021-11-07 DIAGNOSIS — I4891 Unspecified atrial fibrillation: Secondary | ICD-10-CM | POA: Diagnosis not present

## 2021-11-09 DIAGNOSIS — E785 Hyperlipidemia, unspecified: Secondary | ICD-10-CM | POA: Diagnosis not present

## 2021-11-09 DIAGNOSIS — I4891 Unspecified atrial fibrillation: Secondary | ICD-10-CM | POA: Diagnosis not present

## 2021-11-09 DIAGNOSIS — E569 Vitamin deficiency, unspecified: Secondary | ICD-10-CM | POA: Diagnosis not present

## 2021-11-09 DIAGNOSIS — N3946 Mixed incontinence: Secondary | ICD-10-CM | POA: Diagnosis not present

## 2021-11-09 DIAGNOSIS — R0602 Shortness of breath: Secondary | ICD-10-CM | POA: Diagnosis not present

## 2021-11-09 DIAGNOSIS — I509 Heart failure, unspecified: Secondary | ICD-10-CM | POA: Diagnosis not present

## 2021-11-11 DIAGNOSIS — R0602 Shortness of breath: Secondary | ICD-10-CM | POA: Diagnosis not present

## 2021-11-11 DIAGNOSIS — N3946 Mixed incontinence: Secondary | ICD-10-CM | POA: Diagnosis not present

## 2021-11-11 DIAGNOSIS — I509 Heart failure, unspecified: Secondary | ICD-10-CM | POA: Diagnosis not present

## 2021-11-11 DIAGNOSIS — E569 Vitamin deficiency, unspecified: Secondary | ICD-10-CM | POA: Diagnosis not present

## 2021-11-11 DIAGNOSIS — I4891 Unspecified atrial fibrillation: Secondary | ICD-10-CM | POA: Diagnosis not present

## 2021-11-11 DIAGNOSIS — E785 Hyperlipidemia, unspecified: Secondary | ICD-10-CM | POA: Diagnosis not present

## 2021-11-12 DIAGNOSIS — E569 Vitamin deficiency, unspecified: Secondary | ICD-10-CM | POA: Diagnosis not present

## 2021-11-12 DIAGNOSIS — I4891 Unspecified atrial fibrillation: Secondary | ICD-10-CM | POA: Diagnosis not present

## 2021-11-12 DIAGNOSIS — E785 Hyperlipidemia, unspecified: Secondary | ICD-10-CM | POA: Diagnosis not present

## 2021-11-12 DIAGNOSIS — R0602 Shortness of breath: Secondary | ICD-10-CM | POA: Diagnosis not present

## 2021-11-12 DIAGNOSIS — N3946 Mixed incontinence: Secondary | ICD-10-CM | POA: Diagnosis not present

## 2021-11-12 DIAGNOSIS — I509 Heart failure, unspecified: Secondary | ICD-10-CM | POA: Diagnosis not present

## 2021-11-16 DIAGNOSIS — R0602 Shortness of breath: Secondary | ICD-10-CM | POA: Diagnosis not present

## 2021-11-16 DIAGNOSIS — E569 Vitamin deficiency, unspecified: Secondary | ICD-10-CM | POA: Diagnosis not present

## 2021-11-16 DIAGNOSIS — E785 Hyperlipidemia, unspecified: Secondary | ICD-10-CM | POA: Diagnosis not present

## 2021-11-16 DIAGNOSIS — N3946 Mixed incontinence: Secondary | ICD-10-CM | POA: Diagnosis not present

## 2021-11-16 DIAGNOSIS — I4891 Unspecified atrial fibrillation: Secondary | ICD-10-CM | POA: Diagnosis not present

## 2021-11-16 DIAGNOSIS — I509 Heart failure, unspecified: Secondary | ICD-10-CM | POA: Diagnosis not present

## 2021-11-18 DIAGNOSIS — E785 Hyperlipidemia, unspecified: Secondary | ICD-10-CM | POA: Diagnosis not present

## 2021-11-18 DIAGNOSIS — I509 Heart failure, unspecified: Secondary | ICD-10-CM | POA: Diagnosis not present

## 2021-11-18 DIAGNOSIS — I4891 Unspecified atrial fibrillation: Secondary | ICD-10-CM | POA: Diagnosis not present

## 2021-11-18 DIAGNOSIS — R0602 Shortness of breath: Secondary | ICD-10-CM | POA: Diagnosis not present

## 2021-11-18 DIAGNOSIS — N3946 Mixed incontinence: Secondary | ICD-10-CM | POA: Diagnosis not present

## 2021-11-18 DIAGNOSIS — E569 Vitamin deficiency, unspecified: Secondary | ICD-10-CM | POA: Diagnosis not present

## 2021-11-19 DIAGNOSIS — E569 Vitamin deficiency, unspecified: Secondary | ICD-10-CM | POA: Diagnosis not present

## 2021-11-19 DIAGNOSIS — E785 Hyperlipidemia, unspecified: Secondary | ICD-10-CM | POA: Diagnosis not present

## 2021-11-19 DIAGNOSIS — R0602 Shortness of breath: Secondary | ICD-10-CM | POA: Diagnosis not present

## 2021-11-19 DIAGNOSIS — N3946 Mixed incontinence: Secondary | ICD-10-CM | POA: Diagnosis not present

## 2021-11-19 DIAGNOSIS — I509 Heart failure, unspecified: Secondary | ICD-10-CM | POA: Diagnosis not present

## 2021-11-19 DIAGNOSIS — I4891 Unspecified atrial fibrillation: Secondary | ICD-10-CM | POA: Diagnosis not present

## 2021-11-20 DIAGNOSIS — M21612 Bunion of left foot: Secondary | ICD-10-CM | POA: Diagnosis not present

## 2021-11-20 DIAGNOSIS — B351 Tinea unguium: Secondary | ICD-10-CM | POA: Diagnosis not present

## 2021-11-20 DIAGNOSIS — M21611 Bunion of right foot: Secondary | ICD-10-CM | POA: Diagnosis not present

## 2021-11-20 DIAGNOSIS — I739 Peripheral vascular disease, unspecified: Secondary | ICD-10-CM | POA: Diagnosis not present

## 2021-11-23 DIAGNOSIS — R0602 Shortness of breath: Secondary | ICD-10-CM | POA: Diagnosis not present

## 2021-11-23 DIAGNOSIS — E785 Hyperlipidemia, unspecified: Secondary | ICD-10-CM | POA: Diagnosis not present

## 2021-11-23 DIAGNOSIS — I4891 Unspecified atrial fibrillation: Secondary | ICD-10-CM | POA: Diagnosis not present

## 2021-11-23 DIAGNOSIS — E569 Vitamin deficiency, unspecified: Secondary | ICD-10-CM | POA: Diagnosis not present

## 2021-11-23 DIAGNOSIS — N3946 Mixed incontinence: Secondary | ICD-10-CM | POA: Diagnosis not present

## 2021-11-23 DIAGNOSIS — I509 Heart failure, unspecified: Secondary | ICD-10-CM | POA: Diagnosis not present

## 2021-11-25 DIAGNOSIS — N3946 Mixed incontinence: Secondary | ICD-10-CM | POA: Diagnosis not present

## 2021-11-25 DIAGNOSIS — R0602 Shortness of breath: Secondary | ICD-10-CM | POA: Diagnosis not present

## 2021-11-25 DIAGNOSIS — I4891 Unspecified atrial fibrillation: Secondary | ICD-10-CM | POA: Diagnosis not present

## 2021-11-25 DIAGNOSIS — E569 Vitamin deficiency, unspecified: Secondary | ICD-10-CM | POA: Diagnosis not present

## 2021-11-25 DIAGNOSIS — I509 Heart failure, unspecified: Secondary | ICD-10-CM | POA: Diagnosis not present

## 2021-11-25 DIAGNOSIS — E785 Hyperlipidemia, unspecified: Secondary | ICD-10-CM | POA: Diagnosis not present

## 2021-11-26 DIAGNOSIS — E785 Hyperlipidemia, unspecified: Secondary | ICD-10-CM | POA: Diagnosis not present

## 2021-11-26 DIAGNOSIS — E569 Vitamin deficiency, unspecified: Secondary | ICD-10-CM | POA: Diagnosis not present

## 2021-11-26 DIAGNOSIS — I4891 Unspecified atrial fibrillation: Secondary | ICD-10-CM | POA: Diagnosis not present

## 2021-11-26 DIAGNOSIS — R0602 Shortness of breath: Secondary | ICD-10-CM | POA: Diagnosis not present

## 2021-11-26 DIAGNOSIS — I509 Heart failure, unspecified: Secondary | ICD-10-CM | POA: Diagnosis not present

## 2021-11-26 DIAGNOSIS — N3946 Mixed incontinence: Secondary | ICD-10-CM | POA: Diagnosis not present

## 2021-11-29 DIAGNOSIS — I4891 Unspecified atrial fibrillation: Secondary | ICD-10-CM | POA: Diagnosis not present

## 2021-11-29 DIAGNOSIS — E785 Hyperlipidemia, unspecified: Secondary | ICD-10-CM | POA: Diagnosis not present

## 2021-11-29 DIAGNOSIS — N3946 Mixed incontinence: Secondary | ICD-10-CM | POA: Diagnosis not present

## 2021-11-29 DIAGNOSIS — E569 Vitamin deficiency, unspecified: Secondary | ICD-10-CM | POA: Diagnosis not present

## 2021-11-29 DIAGNOSIS — I509 Heart failure, unspecified: Secondary | ICD-10-CM | POA: Diagnosis not present

## 2021-11-29 DIAGNOSIS — F411 Generalized anxiety disorder: Secondary | ICD-10-CM | POA: Diagnosis not present

## 2021-11-29 DIAGNOSIS — R0602 Shortness of breath: Secondary | ICD-10-CM | POA: Diagnosis not present

## 2021-11-29 DIAGNOSIS — I10 Essential (primary) hypertension: Secondary | ICD-10-CM | POA: Diagnosis not present

## 2021-11-29 DIAGNOSIS — G311 Senile degeneration of brain, not elsewhere classified: Secondary | ICD-10-CM | POA: Diagnosis not present

## 2021-11-29 DIAGNOSIS — K5900 Constipation, unspecified: Secondary | ICD-10-CM | POA: Diagnosis not present

## 2021-11-29 DIAGNOSIS — R531 Weakness: Secondary | ICD-10-CM | POA: Diagnosis not present

## 2021-11-30 DIAGNOSIS — R0602 Shortness of breath: Secondary | ICD-10-CM | POA: Diagnosis not present

## 2021-11-30 DIAGNOSIS — N3946 Mixed incontinence: Secondary | ICD-10-CM | POA: Diagnosis not present

## 2021-11-30 DIAGNOSIS — E569 Vitamin deficiency, unspecified: Secondary | ICD-10-CM | POA: Diagnosis not present

## 2021-11-30 DIAGNOSIS — I4891 Unspecified atrial fibrillation: Secondary | ICD-10-CM | POA: Diagnosis not present

## 2021-11-30 DIAGNOSIS — I509 Heart failure, unspecified: Secondary | ICD-10-CM | POA: Diagnosis not present

## 2021-11-30 DIAGNOSIS — E785 Hyperlipidemia, unspecified: Secondary | ICD-10-CM | POA: Diagnosis not present

## 2021-12-02 DIAGNOSIS — E785 Hyperlipidemia, unspecified: Secondary | ICD-10-CM | POA: Diagnosis not present

## 2021-12-02 DIAGNOSIS — E569 Vitamin deficiency, unspecified: Secondary | ICD-10-CM | POA: Diagnosis not present

## 2021-12-02 DIAGNOSIS — N3946 Mixed incontinence: Secondary | ICD-10-CM | POA: Diagnosis not present

## 2021-12-02 DIAGNOSIS — I509 Heart failure, unspecified: Secondary | ICD-10-CM | POA: Diagnosis not present

## 2021-12-02 DIAGNOSIS — I4891 Unspecified atrial fibrillation: Secondary | ICD-10-CM | POA: Diagnosis not present

## 2021-12-02 DIAGNOSIS — R0602 Shortness of breath: Secondary | ICD-10-CM | POA: Diagnosis not present

## 2021-12-03 DIAGNOSIS — R0602 Shortness of breath: Secondary | ICD-10-CM | POA: Diagnosis not present

## 2021-12-03 DIAGNOSIS — I509 Heart failure, unspecified: Secondary | ICD-10-CM | POA: Diagnosis not present

## 2021-12-03 DIAGNOSIS — N3946 Mixed incontinence: Secondary | ICD-10-CM | POA: Diagnosis not present

## 2021-12-03 DIAGNOSIS — E785 Hyperlipidemia, unspecified: Secondary | ICD-10-CM | POA: Diagnosis not present

## 2021-12-03 DIAGNOSIS — E569 Vitamin deficiency, unspecified: Secondary | ICD-10-CM | POA: Diagnosis not present

## 2021-12-03 DIAGNOSIS — I4891 Unspecified atrial fibrillation: Secondary | ICD-10-CM | POA: Diagnosis not present

## 2021-12-07 DIAGNOSIS — E785 Hyperlipidemia, unspecified: Secondary | ICD-10-CM | POA: Diagnosis not present

## 2021-12-07 DIAGNOSIS — I4891 Unspecified atrial fibrillation: Secondary | ICD-10-CM | POA: Diagnosis not present

## 2021-12-07 DIAGNOSIS — R0602 Shortness of breath: Secondary | ICD-10-CM | POA: Diagnosis not present

## 2021-12-07 DIAGNOSIS — E569 Vitamin deficiency, unspecified: Secondary | ICD-10-CM | POA: Diagnosis not present

## 2021-12-07 DIAGNOSIS — I509 Heart failure, unspecified: Secondary | ICD-10-CM | POA: Diagnosis not present

## 2021-12-07 DIAGNOSIS — N3946 Mixed incontinence: Secondary | ICD-10-CM | POA: Diagnosis not present

## 2021-12-09 DIAGNOSIS — E569 Vitamin deficiency, unspecified: Secondary | ICD-10-CM | POA: Diagnosis not present

## 2021-12-09 DIAGNOSIS — N3946 Mixed incontinence: Secondary | ICD-10-CM | POA: Diagnosis not present

## 2021-12-09 DIAGNOSIS — R0602 Shortness of breath: Secondary | ICD-10-CM | POA: Diagnosis not present

## 2021-12-09 DIAGNOSIS — I4891 Unspecified atrial fibrillation: Secondary | ICD-10-CM | POA: Diagnosis not present

## 2021-12-09 DIAGNOSIS — I509 Heart failure, unspecified: Secondary | ICD-10-CM | POA: Diagnosis not present

## 2021-12-09 DIAGNOSIS — E785 Hyperlipidemia, unspecified: Secondary | ICD-10-CM | POA: Diagnosis not present

## 2021-12-10 DIAGNOSIS — I4891 Unspecified atrial fibrillation: Secondary | ICD-10-CM | POA: Diagnosis not present

## 2021-12-10 DIAGNOSIS — E785 Hyperlipidemia, unspecified: Secondary | ICD-10-CM | POA: Diagnosis not present

## 2021-12-10 DIAGNOSIS — R0602 Shortness of breath: Secondary | ICD-10-CM | POA: Diagnosis not present

## 2021-12-10 DIAGNOSIS — N3946 Mixed incontinence: Secondary | ICD-10-CM | POA: Diagnosis not present

## 2021-12-10 DIAGNOSIS — I509 Heart failure, unspecified: Secondary | ICD-10-CM | POA: Diagnosis not present

## 2021-12-10 DIAGNOSIS — E569 Vitamin deficiency, unspecified: Secondary | ICD-10-CM | POA: Diagnosis not present

## 2021-12-14 DIAGNOSIS — I509 Heart failure, unspecified: Secondary | ICD-10-CM | POA: Diagnosis not present

## 2021-12-14 DIAGNOSIS — E569 Vitamin deficiency, unspecified: Secondary | ICD-10-CM | POA: Diagnosis not present

## 2021-12-14 DIAGNOSIS — N3946 Mixed incontinence: Secondary | ICD-10-CM | POA: Diagnosis not present

## 2021-12-14 DIAGNOSIS — I4891 Unspecified atrial fibrillation: Secondary | ICD-10-CM | POA: Diagnosis not present

## 2021-12-14 DIAGNOSIS — R0602 Shortness of breath: Secondary | ICD-10-CM | POA: Diagnosis not present

## 2021-12-14 DIAGNOSIS — E785 Hyperlipidemia, unspecified: Secondary | ICD-10-CM | POA: Diagnosis not present

## 2021-12-16 DIAGNOSIS — E785 Hyperlipidemia, unspecified: Secondary | ICD-10-CM | POA: Diagnosis not present

## 2021-12-16 DIAGNOSIS — I4891 Unspecified atrial fibrillation: Secondary | ICD-10-CM | POA: Diagnosis not present

## 2021-12-16 DIAGNOSIS — E569 Vitamin deficiency, unspecified: Secondary | ICD-10-CM | POA: Diagnosis not present

## 2021-12-16 DIAGNOSIS — R0602 Shortness of breath: Secondary | ICD-10-CM | POA: Diagnosis not present

## 2021-12-16 DIAGNOSIS — I509 Heart failure, unspecified: Secondary | ICD-10-CM | POA: Diagnosis not present

## 2021-12-16 DIAGNOSIS — N3946 Mixed incontinence: Secondary | ICD-10-CM | POA: Diagnosis not present

## 2021-12-17 DIAGNOSIS — E569 Vitamin deficiency, unspecified: Secondary | ICD-10-CM | POA: Diagnosis not present

## 2021-12-17 DIAGNOSIS — I4891 Unspecified atrial fibrillation: Secondary | ICD-10-CM | POA: Diagnosis not present

## 2021-12-17 DIAGNOSIS — N3946 Mixed incontinence: Secondary | ICD-10-CM | POA: Diagnosis not present

## 2021-12-17 DIAGNOSIS — I509 Heart failure, unspecified: Secondary | ICD-10-CM | POA: Diagnosis not present

## 2021-12-17 DIAGNOSIS — E785 Hyperlipidemia, unspecified: Secondary | ICD-10-CM | POA: Diagnosis not present

## 2021-12-17 DIAGNOSIS — R0602 Shortness of breath: Secondary | ICD-10-CM | POA: Diagnosis not present

## 2021-12-21 DIAGNOSIS — R0602 Shortness of breath: Secondary | ICD-10-CM | POA: Diagnosis not present

## 2021-12-21 DIAGNOSIS — E569 Vitamin deficiency, unspecified: Secondary | ICD-10-CM | POA: Diagnosis not present

## 2021-12-21 DIAGNOSIS — E785 Hyperlipidemia, unspecified: Secondary | ICD-10-CM | POA: Diagnosis not present

## 2021-12-21 DIAGNOSIS — I509 Heart failure, unspecified: Secondary | ICD-10-CM | POA: Diagnosis not present

## 2021-12-21 DIAGNOSIS — I4891 Unspecified atrial fibrillation: Secondary | ICD-10-CM | POA: Diagnosis not present

## 2021-12-21 DIAGNOSIS — N3946 Mixed incontinence: Secondary | ICD-10-CM | POA: Diagnosis not present

## 2021-12-23 DIAGNOSIS — I509 Heart failure, unspecified: Secondary | ICD-10-CM | POA: Diagnosis not present

## 2021-12-23 DIAGNOSIS — E569 Vitamin deficiency, unspecified: Secondary | ICD-10-CM | POA: Diagnosis not present

## 2021-12-23 DIAGNOSIS — I4891 Unspecified atrial fibrillation: Secondary | ICD-10-CM | POA: Diagnosis not present

## 2021-12-23 DIAGNOSIS — R0602 Shortness of breath: Secondary | ICD-10-CM | POA: Diagnosis not present

## 2021-12-23 DIAGNOSIS — E785 Hyperlipidemia, unspecified: Secondary | ICD-10-CM | POA: Diagnosis not present

## 2021-12-23 DIAGNOSIS — N3946 Mixed incontinence: Secondary | ICD-10-CM | POA: Diagnosis not present

## 2021-12-24 DIAGNOSIS — R0602 Shortness of breath: Secondary | ICD-10-CM | POA: Diagnosis not present

## 2021-12-24 DIAGNOSIS — E785 Hyperlipidemia, unspecified: Secondary | ICD-10-CM | POA: Diagnosis not present

## 2021-12-24 DIAGNOSIS — I509 Heart failure, unspecified: Secondary | ICD-10-CM | POA: Diagnosis not present

## 2021-12-24 DIAGNOSIS — E569 Vitamin deficiency, unspecified: Secondary | ICD-10-CM | POA: Diagnosis not present

## 2021-12-24 DIAGNOSIS — I4891 Unspecified atrial fibrillation: Secondary | ICD-10-CM | POA: Diagnosis not present

## 2021-12-24 DIAGNOSIS — N3946 Mixed incontinence: Secondary | ICD-10-CM | POA: Diagnosis not present

## 2021-12-25 DIAGNOSIS — E785 Hyperlipidemia, unspecified: Secondary | ICD-10-CM | POA: Diagnosis not present

## 2021-12-25 DIAGNOSIS — I4891 Unspecified atrial fibrillation: Secondary | ICD-10-CM | POA: Diagnosis not present

## 2021-12-25 DIAGNOSIS — E569 Vitamin deficiency, unspecified: Secondary | ICD-10-CM | POA: Diagnosis not present

## 2021-12-25 DIAGNOSIS — R0602 Shortness of breath: Secondary | ICD-10-CM | POA: Diagnosis not present

## 2021-12-25 DIAGNOSIS — I509 Heart failure, unspecified: Secondary | ICD-10-CM | POA: Diagnosis not present

## 2021-12-25 DIAGNOSIS — N3946 Mixed incontinence: Secondary | ICD-10-CM | POA: Diagnosis not present

## 2021-12-29 DIAGNOSIS — Z299 Encounter for prophylactic measures, unspecified: Secondary | ICD-10-CM | POA: Diagnosis not present

## 2021-12-29 DIAGNOSIS — R532 Functional quadriplegia: Secondary | ICD-10-CM | POA: Diagnosis not present

## 2021-12-29 DIAGNOSIS — U071 COVID-19: Secondary | ICD-10-CM | POA: Diagnosis not present

## 2021-12-29 DIAGNOSIS — I10 Essential (primary) hypertension: Secondary | ICD-10-CM | POA: Diagnosis not present

## 2021-12-30 DIAGNOSIS — I509 Heart failure, unspecified: Secondary | ICD-10-CM | POA: Diagnosis not present

## 2021-12-30 DIAGNOSIS — E569 Vitamin deficiency, unspecified: Secondary | ICD-10-CM | POA: Diagnosis not present

## 2021-12-30 DIAGNOSIS — G311 Senile degeneration of brain, not elsewhere classified: Secondary | ICD-10-CM | POA: Diagnosis not present

## 2021-12-30 DIAGNOSIS — I10 Essential (primary) hypertension: Secondary | ICD-10-CM | POA: Diagnosis not present

## 2021-12-30 DIAGNOSIS — R0602 Shortness of breath: Secondary | ICD-10-CM | POA: Diagnosis not present

## 2021-12-30 DIAGNOSIS — F411 Generalized anxiety disorder: Secondary | ICD-10-CM | POA: Diagnosis not present

## 2021-12-30 DIAGNOSIS — E46 Unspecified protein-calorie malnutrition: Secondary | ICD-10-CM | POA: Diagnosis not present

## 2021-12-30 DIAGNOSIS — K5900 Constipation, unspecified: Secondary | ICD-10-CM | POA: Diagnosis not present

## 2021-12-30 DIAGNOSIS — N3946 Mixed incontinence: Secondary | ICD-10-CM | POA: Diagnosis not present

## 2021-12-30 DIAGNOSIS — I4891 Unspecified atrial fibrillation: Secondary | ICD-10-CM | POA: Diagnosis not present

## 2021-12-30 DIAGNOSIS — E785 Hyperlipidemia, unspecified: Secondary | ICD-10-CM | POA: Diagnosis not present

## 2021-12-30 DIAGNOSIS — R531 Weakness: Secondary | ICD-10-CM | POA: Diagnosis not present

## 2021-12-31 DIAGNOSIS — E785 Hyperlipidemia, unspecified: Secondary | ICD-10-CM | POA: Diagnosis not present

## 2021-12-31 DIAGNOSIS — E569 Vitamin deficiency, unspecified: Secondary | ICD-10-CM | POA: Diagnosis not present

## 2021-12-31 DIAGNOSIS — N3946 Mixed incontinence: Secondary | ICD-10-CM | POA: Diagnosis not present

## 2021-12-31 DIAGNOSIS — I4891 Unspecified atrial fibrillation: Secondary | ICD-10-CM | POA: Diagnosis not present

## 2021-12-31 DIAGNOSIS — R0602 Shortness of breath: Secondary | ICD-10-CM | POA: Diagnosis not present

## 2021-12-31 DIAGNOSIS — E46 Unspecified protein-calorie malnutrition: Secondary | ICD-10-CM | POA: Diagnosis not present

## 2022-01-06 DIAGNOSIS — N3946 Mixed incontinence: Secondary | ICD-10-CM | POA: Diagnosis not present

## 2022-01-06 DIAGNOSIS — E785 Hyperlipidemia, unspecified: Secondary | ICD-10-CM | POA: Diagnosis not present

## 2022-01-06 DIAGNOSIS — R0602 Shortness of breath: Secondary | ICD-10-CM | POA: Diagnosis not present

## 2022-01-06 DIAGNOSIS — I4891 Unspecified atrial fibrillation: Secondary | ICD-10-CM | POA: Diagnosis not present

## 2022-01-06 DIAGNOSIS — E46 Unspecified protein-calorie malnutrition: Secondary | ICD-10-CM | POA: Diagnosis not present

## 2022-01-06 DIAGNOSIS — E569 Vitamin deficiency, unspecified: Secondary | ICD-10-CM | POA: Diagnosis not present

## 2022-01-07 DIAGNOSIS — E569 Vitamin deficiency, unspecified: Secondary | ICD-10-CM | POA: Diagnosis not present

## 2022-01-07 DIAGNOSIS — N3946 Mixed incontinence: Secondary | ICD-10-CM | POA: Diagnosis not present

## 2022-01-07 DIAGNOSIS — I4891 Unspecified atrial fibrillation: Secondary | ICD-10-CM | POA: Diagnosis not present

## 2022-01-07 DIAGNOSIS — E785 Hyperlipidemia, unspecified: Secondary | ICD-10-CM | POA: Diagnosis not present

## 2022-01-07 DIAGNOSIS — R0602 Shortness of breath: Secondary | ICD-10-CM | POA: Diagnosis not present

## 2022-01-07 DIAGNOSIS — E46 Unspecified protein-calorie malnutrition: Secondary | ICD-10-CM | POA: Diagnosis not present

## 2022-01-13 DIAGNOSIS — E46 Unspecified protein-calorie malnutrition: Secondary | ICD-10-CM | POA: Diagnosis not present

## 2022-01-13 DIAGNOSIS — I4891 Unspecified atrial fibrillation: Secondary | ICD-10-CM | POA: Diagnosis not present

## 2022-01-13 DIAGNOSIS — R0602 Shortness of breath: Secondary | ICD-10-CM | POA: Diagnosis not present

## 2022-01-13 DIAGNOSIS — N3946 Mixed incontinence: Secondary | ICD-10-CM | POA: Diagnosis not present

## 2022-01-13 DIAGNOSIS — E569 Vitamin deficiency, unspecified: Secondary | ICD-10-CM | POA: Diagnosis not present

## 2022-01-13 DIAGNOSIS — E785 Hyperlipidemia, unspecified: Secondary | ICD-10-CM | POA: Diagnosis not present

## 2022-01-14 DIAGNOSIS — R0602 Shortness of breath: Secondary | ICD-10-CM | POA: Diagnosis not present

## 2022-01-14 DIAGNOSIS — E785 Hyperlipidemia, unspecified: Secondary | ICD-10-CM | POA: Diagnosis not present

## 2022-01-14 DIAGNOSIS — E569 Vitamin deficiency, unspecified: Secondary | ICD-10-CM | POA: Diagnosis not present

## 2022-01-14 DIAGNOSIS — I4891 Unspecified atrial fibrillation: Secondary | ICD-10-CM | POA: Diagnosis not present

## 2022-01-14 DIAGNOSIS — N3946 Mixed incontinence: Secondary | ICD-10-CM | POA: Diagnosis not present

## 2022-01-14 DIAGNOSIS — E46 Unspecified protein-calorie malnutrition: Secondary | ICD-10-CM | POA: Diagnosis not present

## 2022-01-19 DIAGNOSIS — N3946 Mixed incontinence: Secondary | ICD-10-CM | POA: Diagnosis not present

## 2022-01-19 DIAGNOSIS — E569 Vitamin deficiency, unspecified: Secondary | ICD-10-CM | POA: Diagnosis not present

## 2022-01-19 DIAGNOSIS — R0602 Shortness of breath: Secondary | ICD-10-CM | POA: Diagnosis not present

## 2022-01-19 DIAGNOSIS — E46 Unspecified protein-calorie malnutrition: Secondary | ICD-10-CM | POA: Diagnosis not present

## 2022-01-19 DIAGNOSIS — E785 Hyperlipidemia, unspecified: Secondary | ICD-10-CM | POA: Diagnosis not present

## 2022-01-19 DIAGNOSIS — I4891 Unspecified atrial fibrillation: Secondary | ICD-10-CM | POA: Diagnosis not present

## 2022-01-20 DIAGNOSIS — E46 Unspecified protein-calorie malnutrition: Secondary | ICD-10-CM | POA: Diagnosis not present

## 2022-01-20 DIAGNOSIS — I4891 Unspecified atrial fibrillation: Secondary | ICD-10-CM | POA: Diagnosis not present

## 2022-01-20 DIAGNOSIS — E569 Vitamin deficiency, unspecified: Secondary | ICD-10-CM | POA: Diagnosis not present

## 2022-01-20 DIAGNOSIS — R0602 Shortness of breath: Secondary | ICD-10-CM | POA: Diagnosis not present

## 2022-01-20 DIAGNOSIS — N3946 Mixed incontinence: Secondary | ICD-10-CM | POA: Diagnosis not present

## 2022-01-20 DIAGNOSIS — E785 Hyperlipidemia, unspecified: Secondary | ICD-10-CM | POA: Diagnosis not present

## 2022-01-25 DIAGNOSIS — E569 Vitamin deficiency, unspecified: Secondary | ICD-10-CM | POA: Diagnosis not present

## 2022-01-25 DIAGNOSIS — E46 Unspecified protein-calorie malnutrition: Secondary | ICD-10-CM | POA: Diagnosis not present

## 2022-01-25 DIAGNOSIS — N3946 Mixed incontinence: Secondary | ICD-10-CM | POA: Diagnosis not present

## 2022-01-25 DIAGNOSIS — E785 Hyperlipidemia, unspecified: Secondary | ICD-10-CM | POA: Diagnosis not present

## 2022-01-25 DIAGNOSIS — R0602 Shortness of breath: Secondary | ICD-10-CM | POA: Diagnosis not present

## 2022-01-25 DIAGNOSIS — I4891 Unspecified atrial fibrillation: Secondary | ICD-10-CM | POA: Diagnosis not present

## 2022-01-26 DIAGNOSIS — N3946 Mixed incontinence: Secondary | ICD-10-CM | POA: Diagnosis not present

## 2022-01-26 DIAGNOSIS — R0602 Shortness of breath: Secondary | ICD-10-CM | POA: Diagnosis not present

## 2022-01-26 DIAGNOSIS — E569 Vitamin deficiency, unspecified: Secondary | ICD-10-CM | POA: Diagnosis not present

## 2022-01-26 DIAGNOSIS — E785 Hyperlipidemia, unspecified: Secondary | ICD-10-CM | POA: Diagnosis not present

## 2022-01-26 DIAGNOSIS — E46 Unspecified protein-calorie malnutrition: Secondary | ICD-10-CM | POA: Diagnosis not present

## 2022-01-26 DIAGNOSIS — I4891 Unspecified atrial fibrillation: Secondary | ICD-10-CM | POA: Diagnosis not present

## 2022-01-27 DIAGNOSIS — E569 Vitamin deficiency, unspecified: Secondary | ICD-10-CM | POA: Diagnosis not present

## 2022-01-27 DIAGNOSIS — E785 Hyperlipidemia, unspecified: Secondary | ICD-10-CM | POA: Diagnosis not present

## 2022-01-27 DIAGNOSIS — N3946 Mixed incontinence: Secondary | ICD-10-CM | POA: Diagnosis not present

## 2022-01-27 DIAGNOSIS — I4891 Unspecified atrial fibrillation: Secondary | ICD-10-CM | POA: Diagnosis not present

## 2022-01-27 DIAGNOSIS — E46 Unspecified protein-calorie malnutrition: Secondary | ICD-10-CM | POA: Diagnosis not present

## 2022-01-27 DIAGNOSIS — R0602 Shortness of breath: Secondary | ICD-10-CM | POA: Diagnosis not present

## 2022-01-29 DIAGNOSIS — E569 Vitamin deficiency, unspecified: Secondary | ICD-10-CM | POA: Diagnosis not present

## 2022-01-29 DIAGNOSIS — R0602 Shortness of breath: Secondary | ICD-10-CM | POA: Diagnosis not present

## 2022-01-29 DIAGNOSIS — I4891 Unspecified atrial fibrillation: Secondary | ICD-10-CM | POA: Diagnosis not present

## 2022-01-29 DIAGNOSIS — I10 Essential (primary) hypertension: Secondary | ICD-10-CM | POA: Diagnosis not present

## 2022-01-29 DIAGNOSIS — E785 Hyperlipidemia, unspecified: Secondary | ICD-10-CM | POA: Diagnosis not present

## 2022-01-29 DIAGNOSIS — E46 Unspecified protein-calorie malnutrition: Secondary | ICD-10-CM | POA: Diagnosis not present

## 2022-01-29 DIAGNOSIS — G311 Senile degeneration of brain, not elsewhere classified: Secondary | ICD-10-CM | POA: Diagnosis not present

## 2022-01-29 DIAGNOSIS — I509 Heart failure, unspecified: Secondary | ICD-10-CM | POA: Diagnosis not present

## 2022-01-29 DIAGNOSIS — N3946 Mixed incontinence: Secondary | ICD-10-CM | POA: Diagnosis not present

## 2022-01-29 DIAGNOSIS — K5900 Constipation, unspecified: Secondary | ICD-10-CM | POA: Diagnosis not present

## 2022-01-29 DIAGNOSIS — R531 Weakness: Secondary | ICD-10-CM | POA: Diagnosis not present

## 2022-01-29 DIAGNOSIS — F411 Generalized anxiety disorder: Secondary | ICD-10-CM | POA: Diagnosis not present

## 2022-02-01 DIAGNOSIS — I4891 Unspecified atrial fibrillation: Secondary | ICD-10-CM | POA: Diagnosis not present

## 2022-02-01 DIAGNOSIS — E569 Vitamin deficiency, unspecified: Secondary | ICD-10-CM | POA: Diagnosis not present

## 2022-02-01 DIAGNOSIS — E785 Hyperlipidemia, unspecified: Secondary | ICD-10-CM | POA: Diagnosis not present

## 2022-02-01 DIAGNOSIS — N3946 Mixed incontinence: Secondary | ICD-10-CM | POA: Diagnosis not present

## 2022-02-01 DIAGNOSIS — R0602 Shortness of breath: Secondary | ICD-10-CM | POA: Diagnosis not present

## 2022-02-01 DIAGNOSIS — E46 Unspecified protein-calorie malnutrition: Secondary | ICD-10-CM | POA: Diagnosis not present

## 2022-02-03 DIAGNOSIS — R0602 Shortness of breath: Secondary | ICD-10-CM | POA: Diagnosis not present

## 2022-02-03 DIAGNOSIS — E46 Unspecified protein-calorie malnutrition: Secondary | ICD-10-CM | POA: Diagnosis not present

## 2022-02-03 DIAGNOSIS — E785 Hyperlipidemia, unspecified: Secondary | ICD-10-CM | POA: Diagnosis not present

## 2022-02-03 DIAGNOSIS — N3946 Mixed incontinence: Secondary | ICD-10-CM | POA: Diagnosis not present

## 2022-02-03 DIAGNOSIS — I4891 Unspecified atrial fibrillation: Secondary | ICD-10-CM | POA: Diagnosis not present

## 2022-02-03 DIAGNOSIS — E569 Vitamin deficiency, unspecified: Secondary | ICD-10-CM | POA: Diagnosis not present

## 2022-02-04 DIAGNOSIS — E46 Unspecified protein-calorie malnutrition: Secondary | ICD-10-CM | POA: Diagnosis not present

## 2022-02-04 DIAGNOSIS — N3946 Mixed incontinence: Secondary | ICD-10-CM | POA: Diagnosis not present

## 2022-02-04 DIAGNOSIS — R0602 Shortness of breath: Secondary | ICD-10-CM | POA: Diagnosis not present

## 2022-02-04 DIAGNOSIS — E569 Vitamin deficiency, unspecified: Secondary | ICD-10-CM | POA: Diagnosis not present

## 2022-02-04 DIAGNOSIS — I4891 Unspecified atrial fibrillation: Secondary | ICD-10-CM | POA: Diagnosis not present

## 2022-02-04 DIAGNOSIS — E785 Hyperlipidemia, unspecified: Secondary | ICD-10-CM | POA: Diagnosis not present

## 2022-02-08 DIAGNOSIS — M2042 Other hammer toe(s) (acquired), left foot: Secondary | ICD-10-CM | POA: Diagnosis not present

## 2022-02-08 DIAGNOSIS — I739 Peripheral vascular disease, unspecified: Secondary | ICD-10-CM | POA: Diagnosis not present

## 2022-02-08 DIAGNOSIS — B351 Tinea unguium: Secondary | ICD-10-CM | POA: Diagnosis not present

## 2022-02-08 DIAGNOSIS — M2041 Other hammer toe(s) (acquired), right foot: Secondary | ICD-10-CM | POA: Diagnosis not present

## 2022-02-09 DIAGNOSIS — E569 Vitamin deficiency, unspecified: Secondary | ICD-10-CM | POA: Diagnosis not present

## 2022-02-09 DIAGNOSIS — R0602 Shortness of breath: Secondary | ICD-10-CM | POA: Diagnosis not present

## 2022-02-09 DIAGNOSIS — N3946 Mixed incontinence: Secondary | ICD-10-CM | POA: Diagnosis not present

## 2022-02-09 DIAGNOSIS — I4891 Unspecified atrial fibrillation: Secondary | ICD-10-CM | POA: Diagnosis not present

## 2022-02-09 DIAGNOSIS — E46 Unspecified protein-calorie malnutrition: Secondary | ICD-10-CM | POA: Diagnosis not present

## 2022-02-09 DIAGNOSIS — E785 Hyperlipidemia, unspecified: Secondary | ICD-10-CM | POA: Diagnosis not present

## 2022-02-10 DIAGNOSIS — N3946 Mixed incontinence: Secondary | ICD-10-CM | POA: Diagnosis not present

## 2022-02-10 DIAGNOSIS — R0602 Shortness of breath: Secondary | ICD-10-CM | POA: Diagnosis not present

## 2022-02-10 DIAGNOSIS — I4891 Unspecified atrial fibrillation: Secondary | ICD-10-CM | POA: Diagnosis not present

## 2022-02-10 DIAGNOSIS — E569 Vitamin deficiency, unspecified: Secondary | ICD-10-CM | POA: Diagnosis not present

## 2022-02-10 DIAGNOSIS — E785 Hyperlipidemia, unspecified: Secondary | ICD-10-CM | POA: Diagnosis not present

## 2022-02-10 DIAGNOSIS — E46 Unspecified protein-calorie malnutrition: Secondary | ICD-10-CM | POA: Diagnosis not present

## 2022-02-11 DIAGNOSIS — I4891 Unspecified atrial fibrillation: Secondary | ICD-10-CM | POA: Diagnosis not present

## 2022-02-11 DIAGNOSIS — N3946 Mixed incontinence: Secondary | ICD-10-CM | POA: Diagnosis not present

## 2022-02-11 DIAGNOSIS — E785 Hyperlipidemia, unspecified: Secondary | ICD-10-CM | POA: Diagnosis not present

## 2022-02-11 DIAGNOSIS — E46 Unspecified protein-calorie malnutrition: Secondary | ICD-10-CM | POA: Diagnosis not present

## 2022-02-11 DIAGNOSIS — E569 Vitamin deficiency, unspecified: Secondary | ICD-10-CM | POA: Diagnosis not present

## 2022-02-11 DIAGNOSIS — R0602 Shortness of breath: Secondary | ICD-10-CM | POA: Diagnosis not present

## 2022-02-15 DIAGNOSIS — E569 Vitamin deficiency, unspecified: Secondary | ICD-10-CM | POA: Diagnosis not present

## 2022-02-15 DIAGNOSIS — E46 Unspecified protein-calorie malnutrition: Secondary | ICD-10-CM | POA: Diagnosis not present

## 2022-02-15 DIAGNOSIS — I4891 Unspecified atrial fibrillation: Secondary | ICD-10-CM | POA: Diagnosis not present

## 2022-02-15 DIAGNOSIS — R0602 Shortness of breath: Secondary | ICD-10-CM | POA: Diagnosis not present

## 2022-02-15 DIAGNOSIS — E785 Hyperlipidemia, unspecified: Secondary | ICD-10-CM | POA: Diagnosis not present

## 2022-02-15 DIAGNOSIS — N3946 Mixed incontinence: Secondary | ICD-10-CM | POA: Diagnosis not present

## 2022-02-17 DIAGNOSIS — E569 Vitamin deficiency, unspecified: Secondary | ICD-10-CM | POA: Diagnosis not present

## 2022-02-17 DIAGNOSIS — R0602 Shortness of breath: Secondary | ICD-10-CM | POA: Diagnosis not present

## 2022-02-17 DIAGNOSIS — E785 Hyperlipidemia, unspecified: Secondary | ICD-10-CM | POA: Diagnosis not present

## 2022-02-17 DIAGNOSIS — E46 Unspecified protein-calorie malnutrition: Secondary | ICD-10-CM | POA: Diagnosis not present

## 2022-02-17 DIAGNOSIS — N3946 Mixed incontinence: Secondary | ICD-10-CM | POA: Diagnosis not present

## 2022-02-17 DIAGNOSIS — I4891 Unspecified atrial fibrillation: Secondary | ICD-10-CM | POA: Diagnosis not present

## 2022-02-18 DIAGNOSIS — N3946 Mixed incontinence: Secondary | ICD-10-CM | POA: Diagnosis not present

## 2022-02-18 DIAGNOSIS — I4891 Unspecified atrial fibrillation: Secondary | ICD-10-CM | POA: Diagnosis not present

## 2022-02-18 DIAGNOSIS — E46 Unspecified protein-calorie malnutrition: Secondary | ICD-10-CM | POA: Diagnosis not present

## 2022-02-18 DIAGNOSIS — E569 Vitamin deficiency, unspecified: Secondary | ICD-10-CM | POA: Diagnosis not present

## 2022-02-18 DIAGNOSIS — R0602 Shortness of breath: Secondary | ICD-10-CM | POA: Diagnosis not present

## 2022-02-18 DIAGNOSIS — E785 Hyperlipidemia, unspecified: Secondary | ICD-10-CM | POA: Diagnosis not present

## 2022-02-23 DIAGNOSIS — N3946 Mixed incontinence: Secondary | ICD-10-CM | POA: Diagnosis not present

## 2022-02-23 DIAGNOSIS — E569 Vitamin deficiency, unspecified: Secondary | ICD-10-CM | POA: Diagnosis not present

## 2022-02-23 DIAGNOSIS — R532 Functional quadriplegia: Secondary | ICD-10-CM | POA: Diagnosis not present

## 2022-02-23 DIAGNOSIS — F0390 Unspecified dementia without behavioral disturbance: Secondary | ICD-10-CM | POA: Diagnosis not present

## 2022-02-23 DIAGNOSIS — E785 Hyperlipidemia, unspecified: Secondary | ICD-10-CM | POA: Diagnosis not present

## 2022-02-23 DIAGNOSIS — Z299 Encounter for prophylactic measures, unspecified: Secondary | ICD-10-CM | POA: Diagnosis not present

## 2022-02-23 DIAGNOSIS — R0602 Shortness of breath: Secondary | ICD-10-CM | POA: Diagnosis not present

## 2022-02-23 DIAGNOSIS — D692 Other nonthrombocytopenic purpura: Secondary | ICD-10-CM | POA: Diagnosis not present

## 2022-02-23 DIAGNOSIS — E46 Unspecified protein-calorie malnutrition: Secondary | ICD-10-CM | POA: Diagnosis not present

## 2022-02-23 DIAGNOSIS — I4891 Unspecified atrial fibrillation: Secondary | ICD-10-CM | POA: Diagnosis not present

## 2022-02-24 DIAGNOSIS — E46 Unspecified protein-calorie malnutrition: Secondary | ICD-10-CM | POA: Diagnosis not present

## 2022-02-24 DIAGNOSIS — R0602 Shortness of breath: Secondary | ICD-10-CM | POA: Diagnosis not present

## 2022-02-24 DIAGNOSIS — E569 Vitamin deficiency, unspecified: Secondary | ICD-10-CM | POA: Diagnosis not present

## 2022-02-24 DIAGNOSIS — E785 Hyperlipidemia, unspecified: Secondary | ICD-10-CM | POA: Diagnosis not present

## 2022-02-24 DIAGNOSIS — I4891 Unspecified atrial fibrillation: Secondary | ICD-10-CM | POA: Diagnosis not present

## 2022-02-24 DIAGNOSIS — N3946 Mixed incontinence: Secondary | ICD-10-CM | POA: Diagnosis not present

## 2022-02-25 DIAGNOSIS — F0390 Unspecified dementia without behavioral disturbance: Secondary | ICD-10-CM | POA: Diagnosis not present

## 2022-02-25 DIAGNOSIS — E785 Hyperlipidemia, unspecified: Secondary | ICD-10-CM | POA: Diagnosis not present

## 2022-02-25 DIAGNOSIS — Z299 Encounter for prophylactic measures, unspecified: Secondary | ICD-10-CM | POA: Diagnosis not present

## 2022-02-25 DIAGNOSIS — I4891 Unspecified atrial fibrillation: Secondary | ICD-10-CM | POA: Diagnosis not present

## 2022-02-25 DIAGNOSIS — E569 Vitamin deficiency, unspecified: Secondary | ICD-10-CM | POA: Diagnosis not present

## 2022-02-25 DIAGNOSIS — F419 Anxiety disorder, unspecified: Secondary | ICD-10-CM | POA: Diagnosis not present

## 2022-02-25 DIAGNOSIS — R0602 Shortness of breath: Secondary | ICD-10-CM | POA: Diagnosis not present

## 2022-02-25 DIAGNOSIS — N3946 Mixed incontinence: Secondary | ICD-10-CM | POA: Diagnosis not present

## 2022-02-25 DIAGNOSIS — E46 Unspecified protein-calorie malnutrition: Secondary | ICD-10-CM | POA: Diagnosis not present

## 2022-02-25 DIAGNOSIS — I10 Essential (primary) hypertension: Secondary | ICD-10-CM | POA: Diagnosis not present

## 2022-02-25 DIAGNOSIS — I5032 Chronic diastolic (congestive) heart failure: Secondary | ICD-10-CM | POA: Diagnosis not present

## 2022-03-01 DIAGNOSIS — E785 Hyperlipidemia, unspecified: Secondary | ICD-10-CM | POA: Diagnosis not present

## 2022-03-01 DIAGNOSIS — I4891 Unspecified atrial fibrillation: Secondary | ICD-10-CM | POA: Diagnosis not present

## 2022-03-01 DIAGNOSIS — E46 Unspecified protein-calorie malnutrition: Secondary | ICD-10-CM | POA: Diagnosis not present

## 2022-03-01 DIAGNOSIS — I509 Heart failure, unspecified: Secondary | ICD-10-CM | POA: Diagnosis not present

## 2022-03-01 DIAGNOSIS — F411 Generalized anxiety disorder: Secondary | ICD-10-CM | POA: Diagnosis not present

## 2022-03-01 DIAGNOSIS — K5900 Constipation, unspecified: Secondary | ICD-10-CM | POA: Diagnosis not present

## 2022-03-01 DIAGNOSIS — R531 Weakness: Secondary | ICD-10-CM | POA: Diagnosis not present

## 2022-03-01 DIAGNOSIS — N3946 Mixed incontinence: Secondary | ICD-10-CM | POA: Diagnosis not present

## 2022-03-01 DIAGNOSIS — I10 Essential (primary) hypertension: Secondary | ICD-10-CM | POA: Diagnosis not present

## 2022-03-01 DIAGNOSIS — R0602 Shortness of breath: Secondary | ICD-10-CM | POA: Diagnosis not present

## 2022-03-01 DIAGNOSIS — E569 Vitamin deficiency, unspecified: Secondary | ICD-10-CM | POA: Diagnosis not present

## 2022-03-01 DIAGNOSIS — G311 Senile degeneration of brain, not elsewhere classified: Secondary | ICD-10-CM | POA: Diagnosis not present

## 2022-03-03 DIAGNOSIS — E569 Vitamin deficiency, unspecified: Secondary | ICD-10-CM | POA: Diagnosis not present

## 2022-03-03 DIAGNOSIS — R0602 Shortness of breath: Secondary | ICD-10-CM | POA: Diagnosis not present

## 2022-03-03 DIAGNOSIS — I4891 Unspecified atrial fibrillation: Secondary | ICD-10-CM | POA: Diagnosis not present

## 2022-03-03 DIAGNOSIS — E46 Unspecified protein-calorie malnutrition: Secondary | ICD-10-CM | POA: Diagnosis not present

## 2022-03-03 DIAGNOSIS — N3946 Mixed incontinence: Secondary | ICD-10-CM | POA: Diagnosis not present

## 2022-03-03 DIAGNOSIS — E785 Hyperlipidemia, unspecified: Secondary | ICD-10-CM | POA: Diagnosis not present

## 2022-03-04 DIAGNOSIS — R0602 Shortness of breath: Secondary | ICD-10-CM | POA: Diagnosis not present

## 2022-03-04 DIAGNOSIS — E569 Vitamin deficiency, unspecified: Secondary | ICD-10-CM | POA: Diagnosis not present

## 2022-03-04 DIAGNOSIS — E46 Unspecified protein-calorie malnutrition: Secondary | ICD-10-CM | POA: Diagnosis not present

## 2022-03-04 DIAGNOSIS — I4891 Unspecified atrial fibrillation: Secondary | ICD-10-CM | POA: Diagnosis not present

## 2022-03-04 DIAGNOSIS — N3946 Mixed incontinence: Secondary | ICD-10-CM | POA: Diagnosis not present

## 2022-03-04 DIAGNOSIS — E785 Hyperlipidemia, unspecified: Secondary | ICD-10-CM | POA: Diagnosis not present

## 2022-03-08 DIAGNOSIS — E785 Hyperlipidemia, unspecified: Secondary | ICD-10-CM | POA: Diagnosis not present

## 2022-03-08 DIAGNOSIS — R0602 Shortness of breath: Secondary | ICD-10-CM | POA: Diagnosis not present

## 2022-03-08 DIAGNOSIS — N3946 Mixed incontinence: Secondary | ICD-10-CM | POA: Diagnosis not present

## 2022-03-08 DIAGNOSIS — I4891 Unspecified atrial fibrillation: Secondary | ICD-10-CM | POA: Diagnosis not present

## 2022-03-08 DIAGNOSIS — E569 Vitamin deficiency, unspecified: Secondary | ICD-10-CM | POA: Diagnosis not present

## 2022-03-08 DIAGNOSIS — E46 Unspecified protein-calorie malnutrition: Secondary | ICD-10-CM | POA: Diagnosis not present

## 2022-03-10 DIAGNOSIS — E785 Hyperlipidemia, unspecified: Secondary | ICD-10-CM | POA: Diagnosis not present

## 2022-03-10 DIAGNOSIS — R0602 Shortness of breath: Secondary | ICD-10-CM | POA: Diagnosis not present

## 2022-03-10 DIAGNOSIS — I4891 Unspecified atrial fibrillation: Secondary | ICD-10-CM | POA: Diagnosis not present

## 2022-03-10 DIAGNOSIS — E569 Vitamin deficiency, unspecified: Secondary | ICD-10-CM | POA: Diagnosis not present

## 2022-03-10 DIAGNOSIS — N3946 Mixed incontinence: Secondary | ICD-10-CM | POA: Diagnosis not present

## 2022-03-10 DIAGNOSIS — E46 Unspecified protein-calorie malnutrition: Secondary | ICD-10-CM | POA: Diagnosis not present

## 2022-03-11 DIAGNOSIS — E569 Vitamin deficiency, unspecified: Secondary | ICD-10-CM | POA: Diagnosis not present

## 2022-03-11 DIAGNOSIS — E46 Unspecified protein-calorie malnutrition: Secondary | ICD-10-CM | POA: Diagnosis not present

## 2022-03-11 DIAGNOSIS — N3946 Mixed incontinence: Secondary | ICD-10-CM | POA: Diagnosis not present

## 2022-03-11 DIAGNOSIS — R0602 Shortness of breath: Secondary | ICD-10-CM | POA: Diagnosis not present

## 2022-03-11 DIAGNOSIS — E785 Hyperlipidemia, unspecified: Secondary | ICD-10-CM | POA: Diagnosis not present

## 2022-03-11 DIAGNOSIS — I4891 Unspecified atrial fibrillation: Secondary | ICD-10-CM | POA: Diagnosis not present

## 2022-03-18 DIAGNOSIS — R0602 Shortness of breath: Secondary | ICD-10-CM | POA: Diagnosis not present

## 2022-03-18 DIAGNOSIS — N3946 Mixed incontinence: Secondary | ICD-10-CM | POA: Diagnosis not present

## 2022-03-18 DIAGNOSIS — E785 Hyperlipidemia, unspecified: Secondary | ICD-10-CM | POA: Diagnosis not present

## 2022-03-18 DIAGNOSIS — E46 Unspecified protein-calorie malnutrition: Secondary | ICD-10-CM | POA: Diagnosis not present

## 2022-03-18 DIAGNOSIS — E569 Vitamin deficiency, unspecified: Secondary | ICD-10-CM | POA: Diagnosis not present

## 2022-03-18 DIAGNOSIS — I4891 Unspecified atrial fibrillation: Secondary | ICD-10-CM | POA: Diagnosis not present

## 2022-03-19 DIAGNOSIS — R0602 Shortness of breath: Secondary | ICD-10-CM | POA: Diagnosis not present

## 2022-03-19 DIAGNOSIS — I4891 Unspecified atrial fibrillation: Secondary | ICD-10-CM | POA: Diagnosis not present

## 2022-03-19 DIAGNOSIS — E569 Vitamin deficiency, unspecified: Secondary | ICD-10-CM | POA: Diagnosis not present

## 2022-03-19 DIAGNOSIS — N3946 Mixed incontinence: Secondary | ICD-10-CM | POA: Diagnosis not present

## 2022-03-19 DIAGNOSIS — E46 Unspecified protein-calorie malnutrition: Secondary | ICD-10-CM | POA: Diagnosis not present

## 2022-03-19 DIAGNOSIS — E785 Hyperlipidemia, unspecified: Secondary | ICD-10-CM | POA: Diagnosis not present

## 2022-03-22 DIAGNOSIS — I4891 Unspecified atrial fibrillation: Secondary | ICD-10-CM | POA: Diagnosis not present

## 2022-03-22 DIAGNOSIS — R0602 Shortness of breath: Secondary | ICD-10-CM | POA: Diagnosis not present

## 2022-03-22 DIAGNOSIS — N3946 Mixed incontinence: Secondary | ICD-10-CM | POA: Diagnosis not present

## 2022-03-22 DIAGNOSIS — E569 Vitamin deficiency, unspecified: Secondary | ICD-10-CM | POA: Diagnosis not present

## 2022-03-22 DIAGNOSIS — E46 Unspecified protein-calorie malnutrition: Secondary | ICD-10-CM | POA: Diagnosis not present

## 2022-03-22 DIAGNOSIS — E785 Hyperlipidemia, unspecified: Secondary | ICD-10-CM | POA: Diagnosis not present

## 2022-03-24 DIAGNOSIS — N3946 Mixed incontinence: Secondary | ICD-10-CM | POA: Diagnosis not present

## 2022-03-24 DIAGNOSIS — I4891 Unspecified atrial fibrillation: Secondary | ICD-10-CM | POA: Diagnosis not present

## 2022-03-24 DIAGNOSIS — E569 Vitamin deficiency, unspecified: Secondary | ICD-10-CM | POA: Diagnosis not present

## 2022-03-24 DIAGNOSIS — E46 Unspecified protein-calorie malnutrition: Secondary | ICD-10-CM | POA: Diagnosis not present

## 2022-03-24 DIAGNOSIS — R0602 Shortness of breath: Secondary | ICD-10-CM | POA: Diagnosis not present

## 2022-03-24 DIAGNOSIS — E785 Hyperlipidemia, unspecified: Secondary | ICD-10-CM | POA: Diagnosis not present

## 2022-03-25 DIAGNOSIS — E785 Hyperlipidemia, unspecified: Secondary | ICD-10-CM | POA: Diagnosis not present

## 2022-03-25 DIAGNOSIS — E46 Unspecified protein-calorie malnutrition: Secondary | ICD-10-CM | POA: Diagnosis not present

## 2022-03-25 DIAGNOSIS — R0602 Shortness of breath: Secondary | ICD-10-CM | POA: Diagnosis not present

## 2022-03-25 DIAGNOSIS — N3946 Mixed incontinence: Secondary | ICD-10-CM | POA: Diagnosis not present

## 2022-03-25 DIAGNOSIS — I4891 Unspecified atrial fibrillation: Secondary | ICD-10-CM | POA: Diagnosis not present

## 2022-03-25 DIAGNOSIS — E569 Vitamin deficiency, unspecified: Secondary | ICD-10-CM | POA: Diagnosis not present

## 2022-03-29 DIAGNOSIS — E785 Hyperlipidemia, unspecified: Secondary | ICD-10-CM | POA: Diagnosis not present

## 2022-03-29 DIAGNOSIS — E46 Unspecified protein-calorie malnutrition: Secondary | ICD-10-CM | POA: Diagnosis not present

## 2022-03-29 DIAGNOSIS — R0602 Shortness of breath: Secondary | ICD-10-CM | POA: Diagnosis not present

## 2022-03-29 DIAGNOSIS — E569 Vitamin deficiency, unspecified: Secondary | ICD-10-CM | POA: Diagnosis not present

## 2022-03-29 DIAGNOSIS — I4891 Unspecified atrial fibrillation: Secondary | ICD-10-CM | POA: Diagnosis not present

## 2022-03-29 DIAGNOSIS — N3946 Mixed incontinence: Secondary | ICD-10-CM | POA: Diagnosis not present

## 2022-04-01 DIAGNOSIS — R531 Weakness: Secondary | ICD-10-CM | POA: Diagnosis not present

## 2022-04-01 DIAGNOSIS — G311 Senile degeneration of brain, not elsewhere classified: Secondary | ICD-10-CM | POA: Diagnosis not present

## 2022-04-01 DIAGNOSIS — E785 Hyperlipidemia, unspecified: Secondary | ICD-10-CM | POA: Diagnosis not present

## 2022-04-01 DIAGNOSIS — I4891 Unspecified atrial fibrillation: Secondary | ICD-10-CM | POA: Diagnosis not present

## 2022-04-01 DIAGNOSIS — N3946 Mixed incontinence: Secondary | ICD-10-CM | POA: Diagnosis not present

## 2022-04-01 DIAGNOSIS — K5900 Constipation, unspecified: Secondary | ICD-10-CM | POA: Diagnosis not present

## 2022-04-01 DIAGNOSIS — I10 Essential (primary) hypertension: Secondary | ICD-10-CM | POA: Diagnosis not present

## 2022-04-01 DIAGNOSIS — I509 Heart failure, unspecified: Secondary | ICD-10-CM | POA: Diagnosis not present

## 2022-04-01 DIAGNOSIS — F411 Generalized anxiety disorder: Secondary | ICD-10-CM | POA: Diagnosis not present

## 2022-04-01 DIAGNOSIS — E569 Vitamin deficiency, unspecified: Secondary | ICD-10-CM | POA: Diagnosis not present

## 2022-04-01 DIAGNOSIS — E46 Unspecified protein-calorie malnutrition: Secondary | ICD-10-CM | POA: Diagnosis not present

## 2022-04-01 DIAGNOSIS — R0602 Shortness of breath: Secondary | ICD-10-CM | POA: Diagnosis not present

## 2022-04-07 DIAGNOSIS — N3946 Mixed incontinence: Secondary | ICD-10-CM | POA: Diagnosis not present

## 2022-04-07 DIAGNOSIS — E785 Hyperlipidemia, unspecified: Secondary | ICD-10-CM | POA: Diagnosis not present

## 2022-04-07 DIAGNOSIS — E46 Unspecified protein-calorie malnutrition: Secondary | ICD-10-CM | POA: Diagnosis not present

## 2022-04-07 DIAGNOSIS — E569 Vitamin deficiency, unspecified: Secondary | ICD-10-CM | POA: Diagnosis not present

## 2022-04-07 DIAGNOSIS — R0602 Shortness of breath: Secondary | ICD-10-CM | POA: Diagnosis not present

## 2022-04-07 DIAGNOSIS — I4891 Unspecified atrial fibrillation: Secondary | ICD-10-CM | POA: Diagnosis not present

## 2022-04-12 DIAGNOSIS — B351 Tinea unguium: Secondary | ICD-10-CM | POA: Diagnosis not present

## 2022-04-12 DIAGNOSIS — M2041 Other hammer toe(s) (acquired), right foot: Secondary | ICD-10-CM | POA: Diagnosis not present

## 2022-04-12 DIAGNOSIS — M2042 Other hammer toe(s) (acquired), left foot: Secondary | ICD-10-CM | POA: Diagnosis not present

## 2022-04-12 DIAGNOSIS — I739 Peripheral vascular disease, unspecified: Secondary | ICD-10-CM | POA: Diagnosis not present

## 2022-04-14 DIAGNOSIS — I4891 Unspecified atrial fibrillation: Secondary | ICD-10-CM | POA: Diagnosis not present

## 2022-04-14 DIAGNOSIS — E46 Unspecified protein-calorie malnutrition: Secondary | ICD-10-CM | POA: Diagnosis not present

## 2022-04-14 DIAGNOSIS — N3946 Mixed incontinence: Secondary | ICD-10-CM | POA: Diagnosis not present

## 2022-04-14 DIAGNOSIS — R0602 Shortness of breath: Secondary | ICD-10-CM | POA: Diagnosis not present

## 2022-04-14 DIAGNOSIS — E785 Hyperlipidemia, unspecified: Secondary | ICD-10-CM | POA: Diagnosis not present

## 2022-04-14 DIAGNOSIS — E569 Vitamin deficiency, unspecified: Secondary | ICD-10-CM | POA: Diagnosis not present

## 2022-04-19 DIAGNOSIS — E569 Vitamin deficiency, unspecified: Secondary | ICD-10-CM | POA: Diagnosis not present

## 2022-04-19 DIAGNOSIS — N3946 Mixed incontinence: Secondary | ICD-10-CM | POA: Diagnosis not present

## 2022-04-19 DIAGNOSIS — I4891 Unspecified atrial fibrillation: Secondary | ICD-10-CM | POA: Diagnosis not present

## 2022-04-19 DIAGNOSIS — E46 Unspecified protein-calorie malnutrition: Secondary | ICD-10-CM | POA: Diagnosis not present

## 2022-04-19 DIAGNOSIS — E785 Hyperlipidemia, unspecified: Secondary | ICD-10-CM | POA: Diagnosis not present

## 2022-04-19 DIAGNOSIS — R0602 Shortness of breath: Secondary | ICD-10-CM | POA: Diagnosis not present

## 2022-04-20 DIAGNOSIS — D6869 Other thrombophilia: Secondary | ICD-10-CM | POA: Diagnosis not present

## 2022-04-20 DIAGNOSIS — Z299 Encounter for prophylactic measures, unspecified: Secondary | ICD-10-CM | POA: Diagnosis not present

## 2022-04-20 DIAGNOSIS — I4891 Unspecified atrial fibrillation: Secondary | ICD-10-CM | POA: Diagnosis not present

## 2022-04-20 DIAGNOSIS — R532 Functional quadriplegia: Secondary | ICD-10-CM | POA: Diagnosis not present

## 2022-04-20 DIAGNOSIS — M545 Low back pain, unspecified: Secondary | ICD-10-CM | POA: Diagnosis not present

## 2022-04-21 DIAGNOSIS — R0602 Shortness of breath: Secondary | ICD-10-CM | POA: Diagnosis not present

## 2022-04-21 DIAGNOSIS — E785 Hyperlipidemia, unspecified: Secondary | ICD-10-CM | POA: Diagnosis not present

## 2022-04-21 DIAGNOSIS — E46 Unspecified protein-calorie malnutrition: Secondary | ICD-10-CM | POA: Diagnosis not present

## 2022-04-21 DIAGNOSIS — I4891 Unspecified atrial fibrillation: Secondary | ICD-10-CM | POA: Diagnosis not present

## 2022-04-21 DIAGNOSIS — N3946 Mixed incontinence: Secondary | ICD-10-CM | POA: Diagnosis not present

## 2022-04-21 DIAGNOSIS — E569 Vitamin deficiency, unspecified: Secondary | ICD-10-CM | POA: Diagnosis not present

## 2022-04-22 DIAGNOSIS — F0390 Unspecified dementia without behavioral disturbance: Secondary | ICD-10-CM | POA: Diagnosis not present

## 2022-04-22 DIAGNOSIS — R532 Functional quadriplegia: Secondary | ICD-10-CM | POA: Diagnosis not present

## 2022-04-22 DIAGNOSIS — Z299 Encounter for prophylactic measures, unspecified: Secondary | ICD-10-CM | POA: Diagnosis not present

## 2022-04-22 DIAGNOSIS — I4891 Unspecified atrial fibrillation: Secondary | ICD-10-CM | POA: Diagnosis not present

## 2022-04-22 DIAGNOSIS — I10 Essential (primary) hypertension: Secondary | ICD-10-CM | POA: Diagnosis not present

## 2022-04-28 DIAGNOSIS — E46 Unspecified protein-calorie malnutrition: Secondary | ICD-10-CM | POA: Diagnosis not present

## 2022-04-28 DIAGNOSIS — E569 Vitamin deficiency, unspecified: Secondary | ICD-10-CM | POA: Diagnosis not present

## 2022-04-28 DIAGNOSIS — R0602 Shortness of breath: Secondary | ICD-10-CM | POA: Diagnosis not present

## 2022-04-28 DIAGNOSIS — I4891 Unspecified atrial fibrillation: Secondary | ICD-10-CM | POA: Diagnosis not present

## 2022-04-28 DIAGNOSIS — E785 Hyperlipidemia, unspecified: Secondary | ICD-10-CM | POA: Diagnosis not present

## 2022-04-28 DIAGNOSIS — N3946 Mixed incontinence: Secondary | ICD-10-CM | POA: Diagnosis not present

## 2022-04-29 DIAGNOSIS — E785 Hyperlipidemia, unspecified: Secondary | ICD-10-CM | POA: Diagnosis not present

## 2022-04-29 DIAGNOSIS — E46 Unspecified protein-calorie malnutrition: Secondary | ICD-10-CM | POA: Diagnosis not present

## 2022-04-29 DIAGNOSIS — E569 Vitamin deficiency, unspecified: Secondary | ICD-10-CM | POA: Diagnosis not present

## 2022-04-29 DIAGNOSIS — R0602 Shortness of breath: Secondary | ICD-10-CM | POA: Diagnosis not present

## 2022-04-29 DIAGNOSIS — I4891 Unspecified atrial fibrillation: Secondary | ICD-10-CM | POA: Diagnosis not present

## 2022-04-29 DIAGNOSIS — N3946 Mixed incontinence: Secondary | ICD-10-CM | POA: Diagnosis not present

## 2022-04-30 DIAGNOSIS — I10 Essential (primary) hypertension: Secondary | ICD-10-CM | POA: Diagnosis not present

## 2022-04-30 DIAGNOSIS — I509 Heart failure, unspecified: Secondary | ICD-10-CM | POA: Diagnosis not present

## 2022-04-30 DIAGNOSIS — N3946 Mixed incontinence: Secondary | ICD-10-CM | POA: Diagnosis not present

## 2022-04-30 DIAGNOSIS — F411 Generalized anxiety disorder: Secondary | ICD-10-CM | POA: Diagnosis not present

## 2022-04-30 DIAGNOSIS — G311 Senile degeneration of brain, not elsewhere classified: Secondary | ICD-10-CM | POA: Diagnosis not present

## 2022-04-30 DIAGNOSIS — K5900 Constipation, unspecified: Secondary | ICD-10-CM | POA: Diagnosis not present

## 2022-04-30 DIAGNOSIS — R0602 Shortness of breath: Secondary | ICD-10-CM | POA: Diagnosis not present

## 2022-04-30 DIAGNOSIS — E46 Unspecified protein-calorie malnutrition: Secondary | ICD-10-CM | POA: Diagnosis not present

## 2022-04-30 DIAGNOSIS — E569 Vitamin deficiency, unspecified: Secondary | ICD-10-CM | POA: Diagnosis not present

## 2022-04-30 DIAGNOSIS — R531 Weakness: Secondary | ICD-10-CM | POA: Diagnosis not present

## 2022-04-30 DIAGNOSIS — I4891 Unspecified atrial fibrillation: Secondary | ICD-10-CM | POA: Diagnosis not present

## 2022-04-30 DIAGNOSIS — E785 Hyperlipidemia, unspecified: Secondary | ICD-10-CM | POA: Diagnosis not present

## 2022-05-03 DIAGNOSIS — E46 Unspecified protein-calorie malnutrition: Secondary | ICD-10-CM | POA: Diagnosis not present

## 2022-05-03 DIAGNOSIS — E785 Hyperlipidemia, unspecified: Secondary | ICD-10-CM | POA: Diagnosis not present

## 2022-05-03 DIAGNOSIS — R0602 Shortness of breath: Secondary | ICD-10-CM | POA: Diagnosis not present

## 2022-05-03 DIAGNOSIS — E569 Vitamin deficiency, unspecified: Secondary | ICD-10-CM | POA: Diagnosis not present

## 2022-05-03 DIAGNOSIS — I4891 Unspecified atrial fibrillation: Secondary | ICD-10-CM | POA: Diagnosis not present

## 2022-05-03 DIAGNOSIS — N3946 Mixed incontinence: Secondary | ICD-10-CM | POA: Diagnosis not present

## 2022-05-05 DIAGNOSIS — N3946 Mixed incontinence: Secondary | ICD-10-CM | POA: Diagnosis not present

## 2022-05-05 DIAGNOSIS — E569 Vitamin deficiency, unspecified: Secondary | ICD-10-CM | POA: Diagnosis not present

## 2022-05-05 DIAGNOSIS — R0602 Shortness of breath: Secondary | ICD-10-CM | POA: Diagnosis not present

## 2022-05-05 DIAGNOSIS — E46 Unspecified protein-calorie malnutrition: Secondary | ICD-10-CM | POA: Diagnosis not present

## 2022-05-05 DIAGNOSIS — E785 Hyperlipidemia, unspecified: Secondary | ICD-10-CM | POA: Diagnosis not present

## 2022-05-05 DIAGNOSIS — I4891 Unspecified atrial fibrillation: Secondary | ICD-10-CM | POA: Diagnosis not present

## 2022-05-10 DIAGNOSIS — E46 Unspecified protein-calorie malnutrition: Secondary | ICD-10-CM | POA: Diagnosis not present

## 2022-05-10 DIAGNOSIS — R0602 Shortness of breath: Secondary | ICD-10-CM | POA: Diagnosis not present

## 2022-05-10 DIAGNOSIS — N3946 Mixed incontinence: Secondary | ICD-10-CM | POA: Diagnosis not present

## 2022-05-10 DIAGNOSIS — I4891 Unspecified atrial fibrillation: Secondary | ICD-10-CM | POA: Diagnosis not present

## 2022-05-10 DIAGNOSIS — E569 Vitamin deficiency, unspecified: Secondary | ICD-10-CM | POA: Diagnosis not present

## 2022-05-10 DIAGNOSIS — E785 Hyperlipidemia, unspecified: Secondary | ICD-10-CM | POA: Diagnosis not present

## 2022-05-12 DIAGNOSIS — N3946 Mixed incontinence: Secondary | ICD-10-CM | POA: Diagnosis not present

## 2022-05-12 DIAGNOSIS — E46 Unspecified protein-calorie malnutrition: Secondary | ICD-10-CM | POA: Diagnosis not present

## 2022-05-12 DIAGNOSIS — I4891 Unspecified atrial fibrillation: Secondary | ICD-10-CM | POA: Diagnosis not present

## 2022-05-12 DIAGNOSIS — E785 Hyperlipidemia, unspecified: Secondary | ICD-10-CM | POA: Diagnosis not present

## 2022-05-12 DIAGNOSIS — E569 Vitamin deficiency, unspecified: Secondary | ICD-10-CM | POA: Diagnosis not present

## 2022-05-12 DIAGNOSIS — R0602 Shortness of breath: Secondary | ICD-10-CM | POA: Diagnosis not present

## 2022-05-13 DIAGNOSIS — E785 Hyperlipidemia, unspecified: Secondary | ICD-10-CM | POA: Diagnosis not present

## 2022-05-13 DIAGNOSIS — R0602 Shortness of breath: Secondary | ICD-10-CM | POA: Diagnosis not present

## 2022-05-13 DIAGNOSIS — E569 Vitamin deficiency, unspecified: Secondary | ICD-10-CM | POA: Diagnosis not present

## 2022-05-13 DIAGNOSIS — N3946 Mixed incontinence: Secondary | ICD-10-CM | POA: Diagnosis not present

## 2022-05-13 DIAGNOSIS — I4891 Unspecified atrial fibrillation: Secondary | ICD-10-CM | POA: Diagnosis not present

## 2022-05-13 DIAGNOSIS — E46 Unspecified protein-calorie malnutrition: Secondary | ICD-10-CM | POA: Diagnosis not present

## 2022-05-17 DIAGNOSIS — N3946 Mixed incontinence: Secondary | ICD-10-CM | POA: Diagnosis not present

## 2022-05-17 DIAGNOSIS — E785 Hyperlipidemia, unspecified: Secondary | ICD-10-CM | POA: Diagnosis not present

## 2022-05-17 DIAGNOSIS — R0602 Shortness of breath: Secondary | ICD-10-CM | POA: Diagnosis not present

## 2022-05-17 DIAGNOSIS — E46 Unspecified protein-calorie malnutrition: Secondary | ICD-10-CM | POA: Diagnosis not present

## 2022-05-17 DIAGNOSIS — I4891 Unspecified atrial fibrillation: Secondary | ICD-10-CM | POA: Diagnosis not present

## 2022-05-17 DIAGNOSIS — E569 Vitamin deficiency, unspecified: Secondary | ICD-10-CM | POA: Diagnosis not present

## 2022-05-19 DIAGNOSIS — I4891 Unspecified atrial fibrillation: Secondary | ICD-10-CM | POA: Diagnosis not present

## 2022-05-19 DIAGNOSIS — E46 Unspecified protein-calorie malnutrition: Secondary | ICD-10-CM | POA: Diagnosis not present

## 2022-05-19 DIAGNOSIS — R0602 Shortness of breath: Secondary | ICD-10-CM | POA: Diagnosis not present

## 2022-05-19 DIAGNOSIS — E569 Vitamin deficiency, unspecified: Secondary | ICD-10-CM | POA: Diagnosis not present

## 2022-05-19 DIAGNOSIS — N3946 Mixed incontinence: Secondary | ICD-10-CM | POA: Diagnosis not present

## 2022-05-19 DIAGNOSIS — E785 Hyperlipidemia, unspecified: Secondary | ICD-10-CM | POA: Diagnosis not present

## 2022-05-24 DIAGNOSIS — E46 Unspecified protein-calorie malnutrition: Secondary | ICD-10-CM | POA: Diagnosis not present

## 2022-05-24 DIAGNOSIS — R0602 Shortness of breath: Secondary | ICD-10-CM | POA: Diagnosis not present

## 2022-05-24 DIAGNOSIS — E785 Hyperlipidemia, unspecified: Secondary | ICD-10-CM | POA: Diagnosis not present

## 2022-05-24 DIAGNOSIS — N3946 Mixed incontinence: Secondary | ICD-10-CM | POA: Diagnosis not present

## 2022-05-24 DIAGNOSIS — I4891 Unspecified atrial fibrillation: Secondary | ICD-10-CM | POA: Diagnosis not present

## 2022-05-24 DIAGNOSIS — E569 Vitamin deficiency, unspecified: Secondary | ICD-10-CM | POA: Diagnosis not present

## 2022-05-27 DIAGNOSIS — I4891 Unspecified atrial fibrillation: Secondary | ICD-10-CM | POA: Diagnosis not present

## 2022-05-27 DIAGNOSIS — E46 Unspecified protein-calorie malnutrition: Secondary | ICD-10-CM | POA: Diagnosis not present

## 2022-05-27 DIAGNOSIS — R0602 Shortness of breath: Secondary | ICD-10-CM | POA: Diagnosis not present

## 2022-05-27 DIAGNOSIS — N3946 Mixed incontinence: Secondary | ICD-10-CM | POA: Diagnosis not present

## 2022-05-27 DIAGNOSIS — E569 Vitamin deficiency, unspecified: Secondary | ICD-10-CM | POA: Diagnosis not present

## 2022-05-27 DIAGNOSIS — E785 Hyperlipidemia, unspecified: Secondary | ICD-10-CM | POA: Diagnosis not present

## 2022-05-31 DIAGNOSIS — R0602 Shortness of breath: Secondary | ICD-10-CM | POA: Diagnosis not present

## 2022-05-31 DIAGNOSIS — E569 Vitamin deficiency, unspecified: Secondary | ICD-10-CM | POA: Diagnosis not present

## 2022-05-31 DIAGNOSIS — F411 Generalized anxiety disorder: Secondary | ICD-10-CM | POA: Diagnosis not present

## 2022-05-31 DIAGNOSIS — I4891 Unspecified atrial fibrillation: Secondary | ICD-10-CM | POA: Diagnosis not present

## 2022-05-31 DIAGNOSIS — N3946 Mixed incontinence: Secondary | ICD-10-CM | POA: Diagnosis not present

## 2022-05-31 DIAGNOSIS — E46 Unspecified protein-calorie malnutrition: Secondary | ICD-10-CM | POA: Diagnosis not present

## 2022-05-31 DIAGNOSIS — K5900 Constipation, unspecified: Secondary | ICD-10-CM | POA: Diagnosis not present

## 2022-05-31 DIAGNOSIS — I10 Essential (primary) hypertension: Secondary | ICD-10-CM | POA: Diagnosis not present

## 2022-05-31 DIAGNOSIS — I509 Heart failure, unspecified: Secondary | ICD-10-CM | POA: Diagnosis not present

## 2022-05-31 DIAGNOSIS — G311 Senile degeneration of brain, not elsewhere classified: Secondary | ICD-10-CM | POA: Diagnosis not present

## 2022-05-31 DIAGNOSIS — R531 Weakness: Secondary | ICD-10-CM | POA: Diagnosis not present

## 2022-05-31 DIAGNOSIS — E785 Hyperlipidemia, unspecified: Secondary | ICD-10-CM | POA: Diagnosis not present

## 2022-06-02 DIAGNOSIS — R0602 Shortness of breath: Secondary | ICD-10-CM | POA: Diagnosis not present

## 2022-06-02 DIAGNOSIS — N3946 Mixed incontinence: Secondary | ICD-10-CM | POA: Diagnosis not present

## 2022-06-02 DIAGNOSIS — I4891 Unspecified atrial fibrillation: Secondary | ICD-10-CM | POA: Diagnosis not present

## 2022-06-02 DIAGNOSIS — E785 Hyperlipidemia, unspecified: Secondary | ICD-10-CM | POA: Diagnosis not present

## 2022-06-02 DIAGNOSIS — E569 Vitamin deficiency, unspecified: Secondary | ICD-10-CM | POA: Diagnosis not present

## 2022-06-02 DIAGNOSIS — E46 Unspecified protein-calorie malnutrition: Secondary | ICD-10-CM | POA: Diagnosis not present

## 2022-06-03 DIAGNOSIS — I4891 Unspecified atrial fibrillation: Secondary | ICD-10-CM | POA: Diagnosis not present

## 2022-06-03 DIAGNOSIS — R0602 Shortness of breath: Secondary | ICD-10-CM | POA: Diagnosis not present

## 2022-06-03 DIAGNOSIS — E785 Hyperlipidemia, unspecified: Secondary | ICD-10-CM | POA: Diagnosis not present

## 2022-06-03 DIAGNOSIS — E569 Vitamin deficiency, unspecified: Secondary | ICD-10-CM | POA: Diagnosis not present

## 2022-06-03 DIAGNOSIS — N3946 Mixed incontinence: Secondary | ICD-10-CM | POA: Diagnosis not present

## 2022-06-03 DIAGNOSIS — E46 Unspecified protein-calorie malnutrition: Secondary | ICD-10-CM | POA: Diagnosis not present

## 2022-06-07 DIAGNOSIS — R0602 Shortness of breath: Secondary | ICD-10-CM | POA: Diagnosis not present

## 2022-06-07 DIAGNOSIS — I4891 Unspecified atrial fibrillation: Secondary | ICD-10-CM | POA: Diagnosis not present

## 2022-06-07 DIAGNOSIS — E569 Vitamin deficiency, unspecified: Secondary | ICD-10-CM | POA: Diagnosis not present

## 2022-06-07 DIAGNOSIS — E46 Unspecified protein-calorie malnutrition: Secondary | ICD-10-CM | POA: Diagnosis not present

## 2022-06-07 DIAGNOSIS — N3946 Mixed incontinence: Secondary | ICD-10-CM | POA: Diagnosis not present

## 2022-06-07 DIAGNOSIS — E785 Hyperlipidemia, unspecified: Secondary | ICD-10-CM | POA: Diagnosis not present

## 2022-06-09 DIAGNOSIS — E46 Unspecified protein-calorie malnutrition: Secondary | ICD-10-CM | POA: Diagnosis not present

## 2022-06-09 DIAGNOSIS — R0602 Shortness of breath: Secondary | ICD-10-CM | POA: Diagnosis not present

## 2022-06-09 DIAGNOSIS — E569 Vitamin deficiency, unspecified: Secondary | ICD-10-CM | POA: Diagnosis not present

## 2022-06-09 DIAGNOSIS — I4891 Unspecified atrial fibrillation: Secondary | ICD-10-CM | POA: Diagnosis not present

## 2022-06-09 DIAGNOSIS — E785 Hyperlipidemia, unspecified: Secondary | ICD-10-CM | POA: Diagnosis not present

## 2022-06-09 DIAGNOSIS — N3946 Mixed incontinence: Secondary | ICD-10-CM | POA: Diagnosis not present

## 2022-06-14 DIAGNOSIS — E46 Unspecified protein-calorie malnutrition: Secondary | ICD-10-CM | POA: Diagnosis not present

## 2022-06-14 DIAGNOSIS — N3946 Mixed incontinence: Secondary | ICD-10-CM | POA: Diagnosis not present

## 2022-06-14 DIAGNOSIS — R0602 Shortness of breath: Secondary | ICD-10-CM | POA: Diagnosis not present

## 2022-06-14 DIAGNOSIS — I4891 Unspecified atrial fibrillation: Secondary | ICD-10-CM | POA: Diagnosis not present

## 2022-06-14 DIAGNOSIS — E785 Hyperlipidemia, unspecified: Secondary | ICD-10-CM | POA: Diagnosis not present

## 2022-06-14 DIAGNOSIS — E569 Vitamin deficiency, unspecified: Secondary | ICD-10-CM | POA: Diagnosis not present

## 2022-06-15 DIAGNOSIS — G319 Degenerative disease of nervous system, unspecified: Secondary | ICD-10-CM | POA: Diagnosis not present

## 2022-06-15 DIAGNOSIS — R532 Functional quadriplegia: Secondary | ICD-10-CM | POA: Diagnosis not present

## 2022-06-15 DIAGNOSIS — I5030 Unspecified diastolic (congestive) heart failure: Secondary | ICD-10-CM | POA: Diagnosis not present

## 2022-06-15 DIAGNOSIS — Z299 Encounter for prophylactic measures, unspecified: Secondary | ICD-10-CM | POA: Diagnosis not present

## 2022-06-16 DIAGNOSIS — N3946 Mixed incontinence: Secondary | ICD-10-CM | POA: Diagnosis not present

## 2022-06-16 DIAGNOSIS — E46 Unspecified protein-calorie malnutrition: Secondary | ICD-10-CM | POA: Diagnosis not present

## 2022-06-16 DIAGNOSIS — R0602 Shortness of breath: Secondary | ICD-10-CM | POA: Diagnosis not present

## 2022-06-16 DIAGNOSIS — E785 Hyperlipidemia, unspecified: Secondary | ICD-10-CM | POA: Diagnosis not present

## 2022-06-16 DIAGNOSIS — E569 Vitamin deficiency, unspecified: Secondary | ICD-10-CM | POA: Diagnosis not present

## 2022-06-16 DIAGNOSIS — I4891 Unspecified atrial fibrillation: Secondary | ICD-10-CM | POA: Diagnosis not present

## 2022-06-21 DIAGNOSIS — E46 Unspecified protein-calorie malnutrition: Secondary | ICD-10-CM | POA: Diagnosis not present

## 2022-06-21 DIAGNOSIS — N3946 Mixed incontinence: Secondary | ICD-10-CM | POA: Diagnosis not present

## 2022-06-21 DIAGNOSIS — E785 Hyperlipidemia, unspecified: Secondary | ICD-10-CM | POA: Diagnosis not present

## 2022-06-21 DIAGNOSIS — I4891 Unspecified atrial fibrillation: Secondary | ICD-10-CM | POA: Diagnosis not present

## 2022-06-21 DIAGNOSIS — E569 Vitamin deficiency, unspecified: Secondary | ICD-10-CM | POA: Diagnosis not present

## 2022-06-21 DIAGNOSIS — R0602 Shortness of breath: Secondary | ICD-10-CM | POA: Diagnosis not present

## 2022-06-23 DIAGNOSIS — E785 Hyperlipidemia, unspecified: Secondary | ICD-10-CM | POA: Diagnosis not present

## 2022-06-23 DIAGNOSIS — I4891 Unspecified atrial fibrillation: Secondary | ICD-10-CM | POA: Diagnosis not present

## 2022-06-23 DIAGNOSIS — N3946 Mixed incontinence: Secondary | ICD-10-CM | POA: Diagnosis not present

## 2022-06-23 DIAGNOSIS — R0602 Shortness of breath: Secondary | ICD-10-CM | POA: Diagnosis not present

## 2022-06-23 DIAGNOSIS — E569 Vitamin deficiency, unspecified: Secondary | ICD-10-CM | POA: Diagnosis not present

## 2022-06-23 DIAGNOSIS — E46 Unspecified protein-calorie malnutrition: Secondary | ICD-10-CM | POA: Diagnosis not present

## 2022-06-28 DIAGNOSIS — E785 Hyperlipidemia, unspecified: Secondary | ICD-10-CM | POA: Diagnosis not present

## 2022-06-28 DIAGNOSIS — E46 Unspecified protein-calorie malnutrition: Secondary | ICD-10-CM | POA: Diagnosis not present

## 2022-06-28 DIAGNOSIS — R0602 Shortness of breath: Secondary | ICD-10-CM | POA: Diagnosis not present

## 2022-06-28 DIAGNOSIS — I4891 Unspecified atrial fibrillation: Secondary | ICD-10-CM | POA: Diagnosis not present

## 2022-06-28 DIAGNOSIS — N3946 Mixed incontinence: Secondary | ICD-10-CM | POA: Diagnosis not present

## 2022-06-28 DIAGNOSIS — E569 Vitamin deficiency, unspecified: Secondary | ICD-10-CM | POA: Diagnosis not present

## 2022-06-30 DIAGNOSIS — N3946 Mixed incontinence: Secondary | ICD-10-CM | POA: Diagnosis not present

## 2022-06-30 DIAGNOSIS — I509 Heart failure, unspecified: Secondary | ICD-10-CM | POA: Diagnosis not present

## 2022-06-30 DIAGNOSIS — E46 Unspecified protein-calorie malnutrition: Secondary | ICD-10-CM | POA: Diagnosis not present

## 2022-06-30 DIAGNOSIS — E569 Vitamin deficiency, unspecified: Secondary | ICD-10-CM | POA: Diagnosis not present

## 2022-06-30 DIAGNOSIS — K5900 Constipation, unspecified: Secondary | ICD-10-CM | POA: Diagnosis not present

## 2022-06-30 DIAGNOSIS — F411 Generalized anxiety disorder: Secondary | ICD-10-CM | POA: Diagnosis not present

## 2022-06-30 DIAGNOSIS — I4891 Unspecified atrial fibrillation: Secondary | ICD-10-CM | POA: Diagnosis not present

## 2022-06-30 DIAGNOSIS — R531 Weakness: Secondary | ICD-10-CM | POA: Diagnosis not present

## 2022-06-30 DIAGNOSIS — I10 Essential (primary) hypertension: Secondary | ICD-10-CM | POA: Diagnosis not present

## 2022-06-30 DIAGNOSIS — E785 Hyperlipidemia, unspecified: Secondary | ICD-10-CM | POA: Diagnosis not present

## 2022-06-30 DIAGNOSIS — G311 Senile degeneration of brain, not elsewhere classified: Secondary | ICD-10-CM | POA: Diagnosis not present

## 2022-06-30 DIAGNOSIS — R0602 Shortness of breath: Secondary | ICD-10-CM | POA: Diagnosis not present

## 2022-07-01 DIAGNOSIS — E46 Unspecified protein-calorie malnutrition: Secondary | ICD-10-CM | POA: Diagnosis not present

## 2022-07-01 DIAGNOSIS — N3946 Mixed incontinence: Secondary | ICD-10-CM | POA: Diagnosis not present

## 2022-07-01 DIAGNOSIS — I4891 Unspecified atrial fibrillation: Secondary | ICD-10-CM | POA: Diagnosis not present

## 2022-07-01 DIAGNOSIS — E785 Hyperlipidemia, unspecified: Secondary | ICD-10-CM | POA: Diagnosis not present

## 2022-07-01 DIAGNOSIS — E569 Vitamin deficiency, unspecified: Secondary | ICD-10-CM | POA: Diagnosis not present

## 2022-07-01 DIAGNOSIS — R0602 Shortness of breath: Secondary | ICD-10-CM | POA: Diagnosis not present

## 2022-07-05 DIAGNOSIS — E569 Vitamin deficiency, unspecified: Secondary | ICD-10-CM | POA: Diagnosis not present

## 2022-07-05 DIAGNOSIS — N3946 Mixed incontinence: Secondary | ICD-10-CM | POA: Diagnosis not present

## 2022-07-05 DIAGNOSIS — E785 Hyperlipidemia, unspecified: Secondary | ICD-10-CM | POA: Diagnosis not present

## 2022-07-05 DIAGNOSIS — I4891 Unspecified atrial fibrillation: Secondary | ICD-10-CM | POA: Diagnosis not present

## 2022-07-05 DIAGNOSIS — E46 Unspecified protein-calorie malnutrition: Secondary | ICD-10-CM | POA: Diagnosis not present

## 2022-07-05 DIAGNOSIS — R0602 Shortness of breath: Secondary | ICD-10-CM | POA: Diagnosis not present

## 2022-07-07 DIAGNOSIS — N3946 Mixed incontinence: Secondary | ICD-10-CM | POA: Diagnosis not present

## 2022-07-07 DIAGNOSIS — I4891 Unspecified atrial fibrillation: Secondary | ICD-10-CM | POA: Diagnosis not present

## 2022-07-07 DIAGNOSIS — R0602 Shortness of breath: Secondary | ICD-10-CM | POA: Diagnosis not present

## 2022-07-07 DIAGNOSIS — E46 Unspecified protein-calorie malnutrition: Secondary | ICD-10-CM | POA: Diagnosis not present

## 2022-07-07 DIAGNOSIS — E569 Vitamin deficiency, unspecified: Secondary | ICD-10-CM | POA: Diagnosis not present

## 2022-07-07 DIAGNOSIS — E785 Hyperlipidemia, unspecified: Secondary | ICD-10-CM | POA: Diagnosis not present

## 2022-07-12 DIAGNOSIS — E46 Unspecified protein-calorie malnutrition: Secondary | ICD-10-CM | POA: Diagnosis not present

## 2022-07-12 DIAGNOSIS — I4891 Unspecified atrial fibrillation: Secondary | ICD-10-CM | POA: Diagnosis not present

## 2022-07-12 DIAGNOSIS — R0602 Shortness of breath: Secondary | ICD-10-CM | POA: Diagnosis not present

## 2022-07-12 DIAGNOSIS — E569 Vitamin deficiency, unspecified: Secondary | ICD-10-CM | POA: Diagnosis not present

## 2022-07-12 DIAGNOSIS — N3946 Mixed incontinence: Secondary | ICD-10-CM | POA: Diagnosis not present

## 2022-07-12 DIAGNOSIS — E785 Hyperlipidemia, unspecified: Secondary | ICD-10-CM | POA: Diagnosis not present

## 2022-07-14 DIAGNOSIS — E785 Hyperlipidemia, unspecified: Secondary | ICD-10-CM | POA: Diagnosis not present

## 2022-07-14 DIAGNOSIS — R0602 Shortness of breath: Secondary | ICD-10-CM | POA: Diagnosis not present

## 2022-07-14 DIAGNOSIS — I4891 Unspecified atrial fibrillation: Secondary | ICD-10-CM | POA: Diagnosis not present

## 2022-07-14 DIAGNOSIS — N3946 Mixed incontinence: Secondary | ICD-10-CM | POA: Diagnosis not present

## 2022-07-14 DIAGNOSIS — E569 Vitamin deficiency, unspecified: Secondary | ICD-10-CM | POA: Diagnosis not present

## 2022-07-14 DIAGNOSIS — E46 Unspecified protein-calorie malnutrition: Secondary | ICD-10-CM | POA: Diagnosis not present

## 2022-07-19 DIAGNOSIS — I4891 Unspecified atrial fibrillation: Secondary | ICD-10-CM | POA: Diagnosis not present

## 2022-07-19 DIAGNOSIS — N3946 Mixed incontinence: Secondary | ICD-10-CM | POA: Diagnosis not present

## 2022-07-19 DIAGNOSIS — E569 Vitamin deficiency, unspecified: Secondary | ICD-10-CM | POA: Diagnosis not present

## 2022-07-19 DIAGNOSIS — R0602 Shortness of breath: Secondary | ICD-10-CM | POA: Diagnosis not present

## 2022-07-19 DIAGNOSIS — E46 Unspecified protein-calorie malnutrition: Secondary | ICD-10-CM | POA: Diagnosis not present

## 2022-07-19 DIAGNOSIS — E785 Hyperlipidemia, unspecified: Secondary | ICD-10-CM | POA: Diagnosis not present

## 2022-07-20 DIAGNOSIS — E785 Hyperlipidemia, unspecified: Secondary | ICD-10-CM | POA: Diagnosis not present

## 2022-07-20 DIAGNOSIS — E569 Vitamin deficiency, unspecified: Secondary | ICD-10-CM | POA: Diagnosis not present

## 2022-07-20 DIAGNOSIS — E46 Unspecified protein-calorie malnutrition: Secondary | ICD-10-CM | POA: Diagnosis not present

## 2022-07-20 DIAGNOSIS — I4891 Unspecified atrial fibrillation: Secondary | ICD-10-CM | POA: Diagnosis not present

## 2022-07-20 DIAGNOSIS — N3946 Mixed incontinence: Secondary | ICD-10-CM | POA: Diagnosis not present

## 2022-07-20 DIAGNOSIS — R0602 Shortness of breath: Secondary | ICD-10-CM | POA: Diagnosis not present

## 2022-07-21 DIAGNOSIS — E569 Vitamin deficiency, unspecified: Secondary | ICD-10-CM | POA: Diagnosis not present

## 2022-07-21 DIAGNOSIS — E46 Unspecified protein-calorie malnutrition: Secondary | ICD-10-CM | POA: Diagnosis not present

## 2022-07-21 DIAGNOSIS — E785 Hyperlipidemia, unspecified: Secondary | ICD-10-CM | POA: Diagnosis not present

## 2022-07-21 DIAGNOSIS — R0602 Shortness of breath: Secondary | ICD-10-CM | POA: Diagnosis not present

## 2022-07-21 DIAGNOSIS — N3946 Mixed incontinence: Secondary | ICD-10-CM | POA: Diagnosis not present

## 2022-07-21 DIAGNOSIS — I4891 Unspecified atrial fibrillation: Secondary | ICD-10-CM | POA: Diagnosis not present

## 2022-07-22 DIAGNOSIS — I10 Essential (primary) hypertension: Secondary | ICD-10-CM | POA: Diagnosis not present

## 2022-07-22 DIAGNOSIS — I5030 Unspecified diastolic (congestive) heart failure: Secondary | ICD-10-CM | POA: Diagnosis not present

## 2022-07-22 DIAGNOSIS — G319 Degenerative disease of nervous system, unspecified: Secondary | ICD-10-CM | POA: Diagnosis not present

## 2022-07-22 DIAGNOSIS — Z299 Encounter for prophylactic measures, unspecified: Secondary | ICD-10-CM | POA: Diagnosis not present

## 2022-07-22 DIAGNOSIS — I4891 Unspecified atrial fibrillation: Secondary | ICD-10-CM | POA: Diagnosis not present

## 2022-07-22 DIAGNOSIS — I5032 Chronic diastolic (congestive) heart failure: Secondary | ICD-10-CM | POA: Diagnosis not present

## 2022-07-28 DIAGNOSIS — N3946 Mixed incontinence: Secondary | ICD-10-CM | POA: Diagnosis not present

## 2022-07-28 DIAGNOSIS — R0602 Shortness of breath: Secondary | ICD-10-CM | POA: Diagnosis not present

## 2022-07-28 DIAGNOSIS — I4891 Unspecified atrial fibrillation: Secondary | ICD-10-CM | POA: Diagnosis not present

## 2022-07-28 DIAGNOSIS — E46 Unspecified protein-calorie malnutrition: Secondary | ICD-10-CM | POA: Diagnosis not present

## 2022-07-28 DIAGNOSIS — E569 Vitamin deficiency, unspecified: Secondary | ICD-10-CM | POA: Diagnosis not present

## 2022-07-28 DIAGNOSIS — E785 Hyperlipidemia, unspecified: Secondary | ICD-10-CM | POA: Diagnosis not present

## 2022-07-29 DIAGNOSIS — E46 Unspecified protein-calorie malnutrition: Secondary | ICD-10-CM | POA: Diagnosis not present

## 2022-07-29 DIAGNOSIS — R0602 Shortness of breath: Secondary | ICD-10-CM | POA: Diagnosis not present

## 2022-07-29 DIAGNOSIS — N3946 Mixed incontinence: Secondary | ICD-10-CM | POA: Diagnosis not present

## 2022-07-29 DIAGNOSIS — E785 Hyperlipidemia, unspecified: Secondary | ICD-10-CM | POA: Diagnosis not present

## 2022-07-29 DIAGNOSIS — E569 Vitamin deficiency, unspecified: Secondary | ICD-10-CM | POA: Diagnosis not present

## 2022-07-29 DIAGNOSIS — I4891 Unspecified atrial fibrillation: Secondary | ICD-10-CM | POA: Diagnosis not present

## 2022-07-31 DIAGNOSIS — G311 Senile degeneration of brain, not elsewhere classified: Secondary | ICD-10-CM | POA: Diagnosis not present

## 2022-07-31 DIAGNOSIS — R0602 Shortness of breath: Secondary | ICD-10-CM | POA: Diagnosis not present

## 2022-07-31 DIAGNOSIS — I10 Essential (primary) hypertension: Secondary | ICD-10-CM | POA: Diagnosis not present

## 2022-07-31 DIAGNOSIS — R531 Weakness: Secondary | ICD-10-CM | POA: Diagnosis not present

## 2022-07-31 DIAGNOSIS — E569 Vitamin deficiency, unspecified: Secondary | ICD-10-CM | POA: Diagnosis not present

## 2022-07-31 DIAGNOSIS — E46 Unspecified protein-calorie malnutrition: Secondary | ICD-10-CM | POA: Diagnosis not present

## 2022-07-31 DIAGNOSIS — I509 Heart failure, unspecified: Secondary | ICD-10-CM | POA: Diagnosis not present

## 2022-07-31 DIAGNOSIS — E785 Hyperlipidemia, unspecified: Secondary | ICD-10-CM | POA: Diagnosis not present

## 2022-07-31 DIAGNOSIS — I4891 Unspecified atrial fibrillation: Secondary | ICD-10-CM | POA: Diagnosis not present

## 2022-07-31 DIAGNOSIS — K5900 Constipation, unspecified: Secondary | ICD-10-CM | POA: Diagnosis not present

## 2022-07-31 DIAGNOSIS — F411 Generalized anxiety disorder: Secondary | ICD-10-CM | POA: Diagnosis not present

## 2022-07-31 DIAGNOSIS — N3946 Mixed incontinence: Secondary | ICD-10-CM | POA: Diagnosis not present

## 2022-08-02 DIAGNOSIS — E569 Vitamin deficiency, unspecified: Secondary | ICD-10-CM | POA: Diagnosis not present

## 2022-08-02 DIAGNOSIS — R0602 Shortness of breath: Secondary | ICD-10-CM | POA: Diagnosis not present

## 2022-08-02 DIAGNOSIS — N3946 Mixed incontinence: Secondary | ICD-10-CM | POA: Diagnosis not present

## 2022-08-02 DIAGNOSIS — E46 Unspecified protein-calorie malnutrition: Secondary | ICD-10-CM | POA: Diagnosis not present

## 2022-08-02 DIAGNOSIS — E785 Hyperlipidemia, unspecified: Secondary | ICD-10-CM | POA: Diagnosis not present

## 2022-08-02 DIAGNOSIS — I4891 Unspecified atrial fibrillation: Secondary | ICD-10-CM | POA: Diagnosis not present

## 2022-08-04 DIAGNOSIS — R0602 Shortness of breath: Secondary | ICD-10-CM | POA: Diagnosis not present

## 2022-08-04 DIAGNOSIS — I4891 Unspecified atrial fibrillation: Secondary | ICD-10-CM | POA: Diagnosis not present

## 2022-08-04 DIAGNOSIS — E46 Unspecified protein-calorie malnutrition: Secondary | ICD-10-CM | POA: Diagnosis not present

## 2022-08-04 DIAGNOSIS — N3946 Mixed incontinence: Secondary | ICD-10-CM | POA: Diagnosis not present

## 2022-08-04 DIAGNOSIS — E569 Vitamin deficiency, unspecified: Secondary | ICD-10-CM | POA: Diagnosis not present

## 2022-08-04 DIAGNOSIS — E785 Hyperlipidemia, unspecified: Secondary | ICD-10-CM | POA: Diagnosis not present

## 2022-08-05 DIAGNOSIS — N3946 Mixed incontinence: Secondary | ICD-10-CM | POA: Diagnosis not present

## 2022-08-05 DIAGNOSIS — I5032 Chronic diastolic (congestive) heart failure: Secondary | ICD-10-CM | POA: Diagnosis not present

## 2022-08-05 DIAGNOSIS — E46 Unspecified protein-calorie malnutrition: Secondary | ICD-10-CM | POA: Diagnosis not present

## 2022-08-05 DIAGNOSIS — E569 Vitamin deficiency, unspecified: Secondary | ICD-10-CM | POA: Diagnosis not present

## 2022-08-05 DIAGNOSIS — K5900 Constipation, unspecified: Secondary | ICD-10-CM | POA: Diagnosis not present

## 2022-08-05 DIAGNOSIS — E785 Hyperlipidemia, unspecified: Secondary | ICD-10-CM | POA: Diagnosis not present

## 2022-08-05 DIAGNOSIS — Z299 Encounter for prophylactic measures, unspecified: Secondary | ICD-10-CM | POA: Diagnosis not present

## 2022-08-05 DIAGNOSIS — I4891 Unspecified atrial fibrillation: Secondary | ICD-10-CM | POA: Diagnosis not present

## 2022-08-05 DIAGNOSIS — G319 Degenerative disease of nervous system, unspecified: Secondary | ICD-10-CM | POA: Diagnosis not present

## 2022-08-05 DIAGNOSIS — R0602 Shortness of breath: Secondary | ICD-10-CM | POA: Diagnosis not present

## 2022-08-05 DIAGNOSIS — F0390 Unspecified dementia without behavioral disturbance: Secondary | ICD-10-CM | POA: Diagnosis not present

## 2022-08-05 DIAGNOSIS — I10 Essential (primary) hypertension: Secondary | ICD-10-CM | POA: Diagnosis not present

## 2022-08-09 DIAGNOSIS — I4891 Unspecified atrial fibrillation: Secondary | ICD-10-CM | POA: Diagnosis not present

## 2022-08-09 DIAGNOSIS — E569 Vitamin deficiency, unspecified: Secondary | ICD-10-CM | POA: Diagnosis not present

## 2022-08-09 DIAGNOSIS — E785 Hyperlipidemia, unspecified: Secondary | ICD-10-CM | POA: Diagnosis not present

## 2022-08-09 DIAGNOSIS — N3946 Mixed incontinence: Secondary | ICD-10-CM | POA: Diagnosis not present

## 2022-08-09 DIAGNOSIS — E46 Unspecified protein-calorie malnutrition: Secondary | ICD-10-CM | POA: Diagnosis not present

## 2022-08-09 DIAGNOSIS — R0602 Shortness of breath: Secondary | ICD-10-CM | POA: Diagnosis not present

## 2022-08-10 DIAGNOSIS — Z299 Encounter for prophylactic measures, unspecified: Secondary | ICD-10-CM | POA: Diagnosis not present

## 2022-08-10 DIAGNOSIS — F0390 Unspecified dementia without behavioral disturbance: Secondary | ICD-10-CM | POA: Diagnosis not present

## 2022-08-10 DIAGNOSIS — R532 Functional quadriplegia: Secondary | ICD-10-CM | POA: Diagnosis not present

## 2022-08-11 DIAGNOSIS — E785 Hyperlipidemia, unspecified: Secondary | ICD-10-CM | POA: Diagnosis not present

## 2022-08-11 DIAGNOSIS — N3946 Mixed incontinence: Secondary | ICD-10-CM | POA: Diagnosis not present

## 2022-08-11 DIAGNOSIS — E569 Vitamin deficiency, unspecified: Secondary | ICD-10-CM | POA: Diagnosis not present

## 2022-08-11 DIAGNOSIS — E46 Unspecified protein-calorie malnutrition: Secondary | ICD-10-CM | POA: Diagnosis not present

## 2022-08-11 DIAGNOSIS — I4891 Unspecified atrial fibrillation: Secondary | ICD-10-CM | POA: Diagnosis not present

## 2022-08-11 DIAGNOSIS — R0602 Shortness of breath: Secondary | ICD-10-CM | POA: Diagnosis not present

## 2022-08-16 DIAGNOSIS — E785 Hyperlipidemia, unspecified: Secondary | ICD-10-CM | POA: Diagnosis not present

## 2022-08-16 DIAGNOSIS — E46 Unspecified protein-calorie malnutrition: Secondary | ICD-10-CM | POA: Diagnosis not present

## 2022-08-16 DIAGNOSIS — E569 Vitamin deficiency, unspecified: Secondary | ICD-10-CM | POA: Diagnosis not present

## 2022-08-16 DIAGNOSIS — I4891 Unspecified atrial fibrillation: Secondary | ICD-10-CM | POA: Diagnosis not present

## 2022-08-16 DIAGNOSIS — R0602 Shortness of breath: Secondary | ICD-10-CM | POA: Diagnosis not present

## 2022-08-16 DIAGNOSIS — N3946 Mixed incontinence: Secondary | ICD-10-CM | POA: Diagnosis not present

## 2022-08-18 DIAGNOSIS — E46 Unspecified protein-calorie malnutrition: Secondary | ICD-10-CM | POA: Diagnosis not present

## 2022-08-18 DIAGNOSIS — E569 Vitamin deficiency, unspecified: Secondary | ICD-10-CM | POA: Diagnosis not present

## 2022-08-18 DIAGNOSIS — R0602 Shortness of breath: Secondary | ICD-10-CM | POA: Diagnosis not present

## 2022-08-18 DIAGNOSIS — E785 Hyperlipidemia, unspecified: Secondary | ICD-10-CM | POA: Diagnosis not present

## 2022-08-18 DIAGNOSIS — N3946 Mixed incontinence: Secondary | ICD-10-CM | POA: Diagnosis not present

## 2022-08-18 DIAGNOSIS — I4891 Unspecified atrial fibrillation: Secondary | ICD-10-CM | POA: Diagnosis not present

## 2022-08-23 DIAGNOSIS — N3946 Mixed incontinence: Secondary | ICD-10-CM | POA: Diagnosis not present

## 2022-08-23 DIAGNOSIS — I4891 Unspecified atrial fibrillation: Secondary | ICD-10-CM | POA: Diagnosis not present

## 2022-08-23 DIAGNOSIS — E46 Unspecified protein-calorie malnutrition: Secondary | ICD-10-CM | POA: Diagnosis not present

## 2022-08-23 DIAGNOSIS — E785 Hyperlipidemia, unspecified: Secondary | ICD-10-CM | POA: Diagnosis not present

## 2022-08-23 DIAGNOSIS — E569 Vitamin deficiency, unspecified: Secondary | ICD-10-CM | POA: Diagnosis not present

## 2022-08-23 DIAGNOSIS — R0602 Shortness of breath: Secondary | ICD-10-CM | POA: Diagnosis not present

## 2022-08-25 DIAGNOSIS — E569 Vitamin deficiency, unspecified: Secondary | ICD-10-CM | POA: Diagnosis not present

## 2022-08-25 DIAGNOSIS — E785 Hyperlipidemia, unspecified: Secondary | ICD-10-CM | POA: Diagnosis not present

## 2022-08-25 DIAGNOSIS — I4891 Unspecified atrial fibrillation: Secondary | ICD-10-CM | POA: Diagnosis not present

## 2022-08-25 DIAGNOSIS — E46 Unspecified protein-calorie malnutrition: Secondary | ICD-10-CM | POA: Diagnosis not present

## 2022-08-25 DIAGNOSIS — N3946 Mixed incontinence: Secondary | ICD-10-CM | POA: Diagnosis not present

## 2022-08-25 DIAGNOSIS — R0602 Shortness of breath: Secondary | ICD-10-CM | POA: Diagnosis not present

## 2022-08-30 DIAGNOSIS — G311 Senile degeneration of brain, not elsewhere classified: Secondary | ICD-10-CM | POA: Diagnosis not present

## 2022-08-30 DIAGNOSIS — E569 Vitamin deficiency, unspecified: Secondary | ICD-10-CM | POA: Diagnosis not present

## 2022-08-30 DIAGNOSIS — E785 Hyperlipidemia, unspecified: Secondary | ICD-10-CM | POA: Diagnosis not present

## 2022-08-30 DIAGNOSIS — F411 Generalized anxiety disorder: Secondary | ICD-10-CM | POA: Diagnosis not present

## 2022-08-30 DIAGNOSIS — R0602 Shortness of breath: Secondary | ICD-10-CM | POA: Diagnosis not present

## 2022-08-30 DIAGNOSIS — I10 Essential (primary) hypertension: Secondary | ICD-10-CM | POA: Diagnosis not present

## 2022-08-30 DIAGNOSIS — R531 Weakness: Secondary | ICD-10-CM | POA: Diagnosis not present

## 2022-08-30 DIAGNOSIS — K5900 Constipation, unspecified: Secondary | ICD-10-CM | POA: Diagnosis not present

## 2022-08-30 DIAGNOSIS — I509 Heart failure, unspecified: Secondary | ICD-10-CM | POA: Diagnosis not present

## 2022-08-30 DIAGNOSIS — I4891 Unspecified atrial fibrillation: Secondary | ICD-10-CM | POA: Diagnosis not present

## 2022-08-30 DIAGNOSIS — E46 Unspecified protein-calorie malnutrition: Secondary | ICD-10-CM | POA: Diagnosis not present

## 2022-08-30 DIAGNOSIS — N3946 Mixed incontinence: Secondary | ICD-10-CM | POA: Diagnosis not present

## 2022-09-01 DIAGNOSIS — N3946 Mixed incontinence: Secondary | ICD-10-CM | POA: Diagnosis not present

## 2022-09-01 DIAGNOSIS — E569 Vitamin deficiency, unspecified: Secondary | ICD-10-CM | POA: Diagnosis not present

## 2022-09-01 DIAGNOSIS — R0602 Shortness of breath: Secondary | ICD-10-CM | POA: Diagnosis not present

## 2022-09-01 DIAGNOSIS — E46 Unspecified protein-calorie malnutrition: Secondary | ICD-10-CM | POA: Diagnosis not present

## 2022-09-01 DIAGNOSIS — E785 Hyperlipidemia, unspecified: Secondary | ICD-10-CM | POA: Diagnosis not present

## 2022-09-01 DIAGNOSIS — I4891 Unspecified atrial fibrillation: Secondary | ICD-10-CM | POA: Diagnosis not present

## 2022-09-06 DIAGNOSIS — N3946 Mixed incontinence: Secondary | ICD-10-CM | POA: Diagnosis not present

## 2022-09-06 DIAGNOSIS — E569 Vitamin deficiency, unspecified: Secondary | ICD-10-CM | POA: Diagnosis not present

## 2022-09-06 DIAGNOSIS — E46 Unspecified protein-calorie malnutrition: Secondary | ICD-10-CM | POA: Diagnosis not present

## 2022-09-06 DIAGNOSIS — E785 Hyperlipidemia, unspecified: Secondary | ICD-10-CM | POA: Diagnosis not present

## 2022-09-06 DIAGNOSIS — I4891 Unspecified atrial fibrillation: Secondary | ICD-10-CM | POA: Diagnosis not present

## 2022-09-06 DIAGNOSIS — R0602 Shortness of breath: Secondary | ICD-10-CM | POA: Diagnosis not present

## 2022-09-08 DIAGNOSIS — R0602 Shortness of breath: Secondary | ICD-10-CM | POA: Diagnosis not present

## 2022-09-08 DIAGNOSIS — N3946 Mixed incontinence: Secondary | ICD-10-CM | POA: Diagnosis not present

## 2022-09-08 DIAGNOSIS — E46 Unspecified protein-calorie malnutrition: Secondary | ICD-10-CM | POA: Diagnosis not present

## 2022-09-08 DIAGNOSIS — E785 Hyperlipidemia, unspecified: Secondary | ICD-10-CM | POA: Diagnosis not present

## 2022-09-08 DIAGNOSIS — I4891 Unspecified atrial fibrillation: Secondary | ICD-10-CM | POA: Diagnosis not present

## 2022-09-08 DIAGNOSIS — E569 Vitamin deficiency, unspecified: Secondary | ICD-10-CM | POA: Diagnosis not present

## 2022-09-13 DIAGNOSIS — R0602 Shortness of breath: Secondary | ICD-10-CM | POA: Diagnosis not present

## 2022-09-13 DIAGNOSIS — E569 Vitamin deficiency, unspecified: Secondary | ICD-10-CM | POA: Diagnosis not present

## 2022-09-13 DIAGNOSIS — N3946 Mixed incontinence: Secondary | ICD-10-CM | POA: Diagnosis not present

## 2022-09-13 DIAGNOSIS — E46 Unspecified protein-calorie malnutrition: Secondary | ICD-10-CM | POA: Diagnosis not present

## 2022-09-13 DIAGNOSIS — E785 Hyperlipidemia, unspecified: Secondary | ICD-10-CM | POA: Diagnosis not present

## 2022-09-13 DIAGNOSIS — I4891 Unspecified atrial fibrillation: Secondary | ICD-10-CM | POA: Diagnosis not present

## 2022-09-14 DIAGNOSIS — N3946 Mixed incontinence: Secondary | ICD-10-CM | POA: Diagnosis not present

## 2022-09-14 DIAGNOSIS — E785 Hyperlipidemia, unspecified: Secondary | ICD-10-CM | POA: Diagnosis not present

## 2022-09-14 DIAGNOSIS — R0602 Shortness of breath: Secondary | ICD-10-CM | POA: Diagnosis not present

## 2022-09-14 DIAGNOSIS — E569 Vitamin deficiency, unspecified: Secondary | ICD-10-CM | POA: Diagnosis not present

## 2022-09-14 DIAGNOSIS — I4891 Unspecified atrial fibrillation: Secondary | ICD-10-CM | POA: Diagnosis not present

## 2022-09-14 DIAGNOSIS — E46 Unspecified protein-calorie malnutrition: Secondary | ICD-10-CM | POA: Diagnosis not present

## 2022-09-15 DIAGNOSIS — I4891 Unspecified atrial fibrillation: Secondary | ICD-10-CM | POA: Diagnosis not present

## 2022-09-15 DIAGNOSIS — E785 Hyperlipidemia, unspecified: Secondary | ICD-10-CM | POA: Diagnosis not present

## 2022-09-15 DIAGNOSIS — R0602 Shortness of breath: Secondary | ICD-10-CM | POA: Diagnosis not present

## 2022-09-15 DIAGNOSIS — N3946 Mixed incontinence: Secondary | ICD-10-CM | POA: Diagnosis not present

## 2022-09-15 DIAGNOSIS — E46 Unspecified protein-calorie malnutrition: Secondary | ICD-10-CM | POA: Diagnosis not present

## 2022-09-15 DIAGNOSIS — E569 Vitamin deficiency, unspecified: Secondary | ICD-10-CM | POA: Diagnosis not present

## 2022-09-20 DIAGNOSIS — E569 Vitamin deficiency, unspecified: Secondary | ICD-10-CM | POA: Diagnosis not present

## 2022-09-20 DIAGNOSIS — I4891 Unspecified atrial fibrillation: Secondary | ICD-10-CM | POA: Diagnosis not present

## 2022-09-20 DIAGNOSIS — E46 Unspecified protein-calorie malnutrition: Secondary | ICD-10-CM | POA: Diagnosis not present

## 2022-09-20 DIAGNOSIS — R0602 Shortness of breath: Secondary | ICD-10-CM | POA: Diagnosis not present

## 2022-09-20 DIAGNOSIS — E785 Hyperlipidemia, unspecified: Secondary | ICD-10-CM | POA: Diagnosis not present

## 2022-09-20 DIAGNOSIS — N3946 Mixed incontinence: Secondary | ICD-10-CM | POA: Diagnosis not present

## 2022-09-22 DIAGNOSIS — I4891 Unspecified atrial fibrillation: Secondary | ICD-10-CM | POA: Diagnosis not present

## 2022-09-22 DIAGNOSIS — E785 Hyperlipidemia, unspecified: Secondary | ICD-10-CM | POA: Diagnosis not present

## 2022-09-22 DIAGNOSIS — E569 Vitamin deficiency, unspecified: Secondary | ICD-10-CM | POA: Diagnosis not present

## 2022-09-22 DIAGNOSIS — R0602 Shortness of breath: Secondary | ICD-10-CM | POA: Diagnosis not present

## 2022-09-22 DIAGNOSIS — E46 Unspecified protein-calorie malnutrition: Secondary | ICD-10-CM | POA: Diagnosis not present

## 2022-09-22 DIAGNOSIS — N3946 Mixed incontinence: Secondary | ICD-10-CM | POA: Diagnosis not present

## 2022-09-29 DIAGNOSIS — E785 Hyperlipidemia, unspecified: Secondary | ICD-10-CM | POA: Diagnosis not present

## 2022-09-29 DIAGNOSIS — N3946 Mixed incontinence: Secondary | ICD-10-CM | POA: Diagnosis not present

## 2022-09-29 DIAGNOSIS — E569 Vitamin deficiency, unspecified: Secondary | ICD-10-CM | POA: Diagnosis not present

## 2022-09-29 DIAGNOSIS — I4891 Unspecified atrial fibrillation: Secondary | ICD-10-CM | POA: Diagnosis not present

## 2022-09-29 DIAGNOSIS — E46 Unspecified protein-calorie malnutrition: Secondary | ICD-10-CM | POA: Diagnosis not present

## 2022-09-29 DIAGNOSIS — R0602 Shortness of breath: Secondary | ICD-10-CM | POA: Diagnosis not present

## 2022-09-30 ENCOUNTER — Other Ambulatory Visit: Payer: Self-pay

## 2022-09-30 ENCOUNTER — Emergency Department (HOSPITAL_COMMUNITY)
Admission: EM | Admit: 2022-09-30 | Discharge: 2022-10-01 | Disposition: A | Discharge status: Home / Self Care | Attending: Emergency Medicine | Admitting: Emergency Medicine

## 2022-09-30 ENCOUNTER — Encounter (HOSPITAL_COMMUNITY): Payer: Self-pay

## 2022-09-30 DIAGNOSIS — E785 Hyperlipidemia, unspecified: Secondary | ICD-10-CM | POA: Diagnosis not present

## 2022-09-30 DIAGNOSIS — I509 Heart failure, unspecified: Secondary | ICD-10-CM | POA: Diagnosis not present

## 2022-09-30 DIAGNOSIS — E46 Unspecified protein-calorie malnutrition: Secondary | ICD-10-CM | POA: Diagnosis not present

## 2022-09-30 DIAGNOSIS — Z7901 Long term (current) use of anticoagulants: Secondary | ICD-10-CM | POA: Insufficient documentation

## 2022-09-30 DIAGNOSIS — R0602 Shortness of breath: Secondary | ICD-10-CM | POA: Diagnosis not present

## 2022-09-30 DIAGNOSIS — Z79899 Other long term (current) drug therapy: Secondary | ICD-10-CM | POA: Diagnosis not present

## 2022-09-30 DIAGNOSIS — F0390 Unspecified dementia without behavioral disturbance: Secondary | ICD-10-CM | POA: Insufficient documentation

## 2022-09-30 DIAGNOSIS — I110 Hypertensive heart disease with heart failure: Secondary | ICD-10-CM | POA: Diagnosis not present

## 2022-09-30 DIAGNOSIS — I4891 Unspecified atrial fibrillation: Secondary | ICD-10-CM | POA: Insufficient documentation

## 2022-09-30 DIAGNOSIS — J449 Chronic obstructive pulmonary disease, unspecified: Secondary | ICD-10-CM | POA: Insufficient documentation

## 2022-09-30 DIAGNOSIS — E569 Vitamin deficiency, unspecified: Secondary | ICD-10-CM | POA: Diagnosis not present

## 2022-09-30 DIAGNOSIS — N3946 Mixed incontinence: Secondary | ICD-10-CM | POA: Diagnosis not present

## 2022-09-30 DIAGNOSIS — K5900 Constipation, unspecified: Secondary | ICD-10-CM | POA: Diagnosis not present

## 2022-09-30 DIAGNOSIS — I959 Hypotension, unspecified: Secondary | ICD-10-CM | POA: Diagnosis not present

## 2022-09-30 DIAGNOSIS — F411 Generalized anxiety disorder: Secondary | ICD-10-CM | POA: Diagnosis not present

## 2022-09-30 DIAGNOSIS — I499 Cardiac arrhythmia, unspecified: Secondary | ICD-10-CM | POA: Diagnosis not present

## 2022-09-30 DIAGNOSIS — R0902 Hypoxemia: Secondary | ICD-10-CM | POA: Diagnosis not present

## 2022-09-30 DIAGNOSIS — G311 Senile degeneration of brain, not elsewhere classified: Secondary | ICD-10-CM | POA: Diagnosis not present

## 2022-09-30 DIAGNOSIS — I10 Essential (primary) hypertension: Secondary | ICD-10-CM | POA: Diagnosis not present

## 2022-09-30 DIAGNOSIS — R531 Weakness: Secondary | ICD-10-CM | POA: Diagnosis not present

## 2022-09-30 LAB — CBC WITH DIFFERENTIAL/PLATELET
Abs Immature Granulocytes: 0.07 10*3/uL (ref 0.00–0.07)
Basophils Absolute: 0 10*3/uL (ref 0.0–0.1)
Basophils Relative: 0 %
Eosinophils Absolute: 0 10*3/uL (ref 0.0–0.5)
Eosinophils Relative: 0 %
HCT: 41 % (ref 36.0–46.0)
Hemoglobin: 13.4 g/dL (ref 12.0–15.0)
Immature Granulocytes: 1 %
Lymphocytes Relative: 9 %
Lymphs Abs: 1.1 10*3/uL (ref 0.7–4.0)
MCH: 28.5 pg (ref 26.0–34.0)
MCHC: 32.7 g/dL (ref 30.0–36.0)
MCV: 87 fL (ref 80.0–100.0)
Monocytes Absolute: 1.4 10*3/uL — ABNORMAL HIGH (ref 0.1–1.0)
Monocytes Relative: 11 %
Neutro Abs: 10.6 10*3/uL — ABNORMAL HIGH (ref 1.7–7.7)
Neutrophils Relative %: 79 %
Platelets: 323 10*3/uL (ref 150–400)
RBC: 4.71 MIL/uL (ref 3.87–5.11)
RDW: 14.5 % (ref 11.5–15.5)
WBC: 13.2 10*3/uL — ABNORMAL HIGH (ref 4.0–10.5)
nRBC: 0 % (ref 0.0–0.2)

## 2022-09-30 LAB — BASIC METABOLIC PANEL
Anion gap: 14 (ref 5–15)
BUN: 17 mg/dL (ref 8–23)
CO2: 24 mmol/L (ref 22–32)
Calcium: 8.6 mg/dL — ABNORMAL LOW (ref 8.9–10.3)
Chloride: 102 mmol/L (ref 98–111)
Creatinine, Ser: 0.92 mg/dL (ref 0.44–1.00)
GFR, Estimated: 58 mL/min — ABNORMAL LOW (ref >60)
Glucose, Bld: 126 mg/dL — ABNORMAL HIGH (ref 70–99)
Potassium: 4 mmol/L (ref 3.5–5.1)
Sodium: 140 mmol/L (ref 135–145)

## 2022-09-30 MED ORDER — SODIUM CHLORIDE 0.9 % IV BOLUS
500.0000 mL | Freq: Once | INTRAVENOUS | Status: AC
Start: 2022-09-30 — End: 2022-09-30
  Administered 2022-09-30: 500 mL via INTRAVENOUS

## 2022-09-30 NOTE — ED Notes (Signed)
[facility] called for transport 

## 2022-09-30 NOTE — ED Provider Notes (Signed)
[place] EMERGENCY DEPARTMENT AT [facility] Provider Note   CSN: [id number] Arrival date & time: 09/30/22  1635     History  Chief Complaint  Patient presents with   Afib with RVR   Shortness of Breath    Danielle Brooks is a 89 y.o. female.  HPI    89 year old comes in with chief complaint of shortness of breath.  Per EMS, patient brought from dentist after she was found to be short of breath.  When the dentist evaluated the patient she was noted to be in A-fib with RVR.  Per EMS, patient was also hypotensive.  They gave her 10 of diltiazem and 300 cc of normal saline.  Patient was also placed on oxygen.  Patient has history of COPD, CHF, dementia and A-fib.  She resides at [facility] nursing home.  Later on inpatient stay I spoke with patient's son, Mr. Jeannett Senior.  She indicates that patient is at her baseline normal at this time.  The patient in the afternoon often starts feeling profoundly weak/sleepy.  He sees her every day and there has not been any recent changes.  Patient noted to be slightly slurred, he states that often patient will also sound slurred -and that there is no history of stroke.  He thinks patient looks at baseline, and she can return if she continues to do well.    Home Medications Prior to Admission medications   Medication Sig Start Date End Date Taking? Authorizing Provider  ALPRAZolam (XANAX) 0.25 MG tablet Take 0.25 mg by mouth at bedtime. 06/25/20   [provider]  amiodarone (PACERONE) 200 MG tablet TAKE ONE TABLET BY MOUTH ONCE DAILY 08/26/20   Jonelle Sidle, MD  donepezil (ARICEPT) 5 MG tablet Take 5 mg by mouth daily.    [provider]  furosemide (LASIX) 40 MG tablet Take 40 mg by mouth at bedtime.    [provider]  losartan (COZAAR) 25 MG tablet TAKE 1/2 TABLET BY MOUTH DAILY 12/11/20   Jonelle Sidle, MD  potassium chloride SA (KLOR-CON) 20 MEQ tablet Take 20 mEq by mouth at bedtime.    [provider]  psyllium (METAMUCIL) 0.52 g capsule Take 1 capsule (0.52 g total) by mouth daily. 03/07/21   Bethann Berkshire, MD  traZODone (DESYREL) 100 MG tablet Take 100 mg by mouth at bedtime as needed.     [provider]  Vitamin D, Ergocalciferol, (DRISDOL) 50000 UNITS CAPS Take 50,000 Units by mouth every Tuesday.     [provider]  warfarin (COUMADIN) 5 MG tablet Take 2.5-5 mg by mouth See admin instructions. Take 1 tablet (5mg ) all days except on Wednesday take 1/2 tablet (2.5mg ) Managed by [facility] office    [provider]      Allergies    Bactrim [sulfamethoxazole-trimethoprim], Codeine, and Novocain [procaine hcl]    Review of Systems   Review of Systems  All other systems reviewed and are negative.   Physical Exam Updated Vital Signs BP (!) 105/43   Pulse 88   Temp 98.1 F (36.7 C) (Oral)   Resp (!) 24   SpO2 94%  Physical Exam Vitals and nursing note reviewed.  Constitutional:      Appearance: She is well-developed.  HENT:     Head: Atraumatic.  Cardiovascular:     Rate and Rhythm: Normal rate.  Pulmonary:     Effort: Pulmonary effort is normal.  Musculoskeletal:     Cervical back: Normal  range of motion and neck supple.  Skin:    General: Skin is warm and dry.  Neurological:     General: No focal deficit present.     Mental Status: She is alert and oriented to person, place, and time.     Cranial Nerves: No cranial nerve deficit.     Motor: No weakness.     ED Results / Procedures / Treatments   Labs (all labs ordered are listed, but only abnormal results are displayed) Labs Reviewed  CBC WITH DIFFERENTIAL/PLATELET - Abnormal; Notable for the following components:      Result Value   WBC 13.2 (*)    Neutro Abs 10.6 (*)    Monocytes Absolute 1.4 (*)    All other components within normal limits  BASIC METABOLIC PANEL - Abnormal; Notable for the following components:   Glucose, Bld 126 (*)    Calcium 8.6 (*)    GFR,  Estimated 58 (*)    All other components within normal limits  URINALYSIS, W/ REFLEX TO CULTURE (INFECTION SUSPECTED)    EKG EKG Interpretation Date/Time:  Thursday September 30 2022 16:53:08 EDT Ventricular Rate:  97 PR Interval:    QRS Duration:  102 QT Interval:  374 QTC Calculation: 492 R Axis:   3  Text Interpretation: Atrial flutter Ventricular premature complex Low voltage, precordial leads Minimal ST depression, anterolateral leads Borderline prolonged QT interval afib present Confirmed by Derwood Kaplan [phone]) on 09/30/2022 5:02:53 PM  Radiology No results found.  Procedures Procedures    Medications Ordered in ED Medications  sodium chloride 0.9 % bolus 500 mL (0 mLs Intravenous Stopped 09/30/22 2016)    ED Course/ Medical Decision Making/ A&P         CHA2DS2-VASc Score: 5                        Medical Decision Making Amount and/or Complexity of Data Reviewed Labs: ordered.    89 year old patient comes in with chief complaint of shortness of breath.  Per EMS patient was in A-fib with RVR patient received 10 of delta.  She has history of CHF, COPD, dementia and hypertension.  She is accompanied by her son, who also provides meaningful history.   On exam, patient has no concerning vital signs.  She is currently rate controlled.  She does sound slurred and is sleepy, but the son states that that is baseline normal for patient in the afternoon.  We ordered basic labs, they are reassuring. Patient has no hypoxia, tachypnea.  No evidence of volume overload on exam.  We would like a chest x-ray.   11:32 PM Patient reassessed over 4 hours.  There has not been any arrhythmia.  Her labs are reassuring.  Urine is still pending, but patient is stable for discharge at this time.  Family made aware.  Final Clinical Impression(s) / ED Diagnoses Final diagnoses:  Atrial fibrillation with RVR [facility])    Rx / DC Orders ED Discharge Orders     None          Derwood Kaplan, MD 09/30/22 2332

## 2022-09-30 NOTE — Discharge Instructions (Signed)
Danielle Brooks was sent to the ER for A-fib with rapid ventricular response.  However in the emergency room, she has been in rate.  Her workup in the ER is reassuring.  Please check her vital signs every 4 hours.  Ensure she is getting all her medications.  If she starts having fever, chest pain, shortness of breath, rapid heart rate return to the emergency room.

## 2022-09-30 NOTE — ED Triage Notes (Signed)
Patient brought in by EMS today from doctors appointment. A family friend asked her eye doctor to call 911.   Called out for SOB but when EMS arrived she was in  AFIB RVR  10 card Dropped pressure got 300cc of [place]  6L at 96%  Hx CHF COPD Dementia   Afib is 80-100  [facility] is where she is from   95% 4L 80/palp 80-110 HR

## 2022-10-01 DIAGNOSIS — E46 Unspecified protein-calorie malnutrition: Secondary | ICD-10-CM | POA: Diagnosis not present

## 2022-10-01 DIAGNOSIS — N3946 Mixed incontinence: Secondary | ICD-10-CM | POA: Diagnosis not present

## 2022-10-01 DIAGNOSIS — E785 Hyperlipidemia, unspecified: Secondary | ICD-10-CM | POA: Diagnosis not present

## 2022-10-01 DIAGNOSIS — R0602 Shortness of breath: Secondary | ICD-10-CM | POA: Diagnosis not present

## 2022-10-01 DIAGNOSIS — E569 Vitamin deficiency, unspecified: Secondary | ICD-10-CM | POA: Diagnosis not present

## 2022-10-01 DIAGNOSIS — I4891 Unspecified atrial fibrillation: Secondary | ICD-10-CM | POA: Diagnosis not present

## 2022-10-01 DIAGNOSIS — Z7401 Bed confinement status: Secondary | ICD-10-CM | POA: Diagnosis not present

## 2022-10-01 DIAGNOSIS — R079 Chest pain, unspecified: Secondary | ICD-10-CM | POA: Diagnosis not present

## 2022-10-01 NOTE — ED Notes (Signed)
Attempted to call [facility], no answer

## 2022-10-04 DIAGNOSIS — N3946 Mixed incontinence: Secondary | ICD-10-CM | POA: Diagnosis not present

## 2022-10-04 DIAGNOSIS — R0602 Shortness of breath: Secondary | ICD-10-CM | POA: Diagnosis not present

## 2022-10-04 DIAGNOSIS — E46 Unspecified protein-calorie malnutrition: Secondary | ICD-10-CM | POA: Diagnosis not present

## 2022-10-04 DIAGNOSIS — E569 Vitamin deficiency, unspecified: Secondary | ICD-10-CM | POA: Diagnosis not present

## 2022-10-04 DIAGNOSIS — E785 Hyperlipidemia, unspecified: Secondary | ICD-10-CM | POA: Diagnosis not present

## 2022-10-04 DIAGNOSIS — I4891 Unspecified atrial fibrillation: Secondary | ICD-10-CM | POA: Diagnosis not present

## 2022-10-06 DIAGNOSIS — I4891 Unspecified atrial fibrillation: Secondary | ICD-10-CM | POA: Diagnosis not present

## 2022-10-06 DIAGNOSIS — N3946 Mixed incontinence: Secondary | ICD-10-CM | POA: Diagnosis not present

## 2022-10-06 DIAGNOSIS — E569 Vitamin deficiency, unspecified: Secondary | ICD-10-CM | POA: Diagnosis not present

## 2022-10-06 DIAGNOSIS — E46 Unspecified protein-calorie malnutrition: Secondary | ICD-10-CM | POA: Diagnosis not present

## 2022-10-06 DIAGNOSIS — R0602 Shortness of breath: Secondary | ICD-10-CM | POA: Diagnosis not present

## 2022-10-06 DIAGNOSIS — E785 Hyperlipidemia, unspecified: Secondary | ICD-10-CM | POA: Diagnosis not present

## 2022-10-08 ENCOUNTER — Telehealth: Payer: Self-pay

## 2022-10-08 NOTE — Telephone Encounter (Signed)
Transition Care Management Unsuccessful Follow-up Telephone Call  Date of discharge and from where:  [facility] 8/2  Attempts:  1st Attempt  Reason for unsuccessful TCM follow-up call:  Missing or invalid number   Lenard Forth [facility] Guide, [facility] [phone] [address] [place], [place], [place] [address] Phone: [phone] Email: [email or web address]

## 2022-10-11 DIAGNOSIS — R0602 Shortness of breath: Secondary | ICD-10-CM | POA: Diagnosis not present

## 2022-10-11 DIAGNOSIS — E569 Vitamin deficiency, unspecified: Secondary | ICD-10-CM | POA: Diagnosis not present

## 2022-10-11 DIAGNOSIS — I4891 Unspecified atrial fibrillation: Secondary | ICD-10-CM | POA: Diagnosis not present

## 2022-10-11 DIAGNOSIS — E785 Hyperlipidemia, unspecified: Secondary | ICD-10-CM | POA: Diagnosis not present

## 2022-10-11 DIAGNOSIS — N3946 Mixed incontinence: Secondary | ICD-10-CM | POA: Diagnosis not present

## 2022-10-11 DIAGNOSIS — E46 Unspecified protein-calorie malnutrition: Secondary | ICD-10-CM | POA: Diagnosis not present

## 2022-10-12 DIAGNOSIS — N3946 Mixed incontinence: Secondary | ICD-10-CM | POA: Diagnosis not present

## 2022-10-12 DIAGNOSIS — R0602 Shortness of breath: Secondary | ICD-10-CM | POA: Diagnosis not present

## 2022-10-12 DIAGNOSIS — E785 Hyperlipidemia, unspecified: Secondary | ICD-10-CM | POA: Diagnosis not present

## 2022-10-12 DIAGNOSIS — I4891 Unspecified atrial fibrillation: Secondary | ICD-10-CM | POA: Diagnosis not present

## 2022-10-12 DIAGNOSIS — E46 Unspecified protein-calorie malnutrition: Secondary | ICD-10-CM | POA: Diagnosis not present

## 2022-10-12 DIAGNOSIS — E569 Vitamin deficiency, unspecified: Secondary | ICD-10-CM | POA: Diagnosis not present

## 2022-10-13 DIAGNOSIS — I4891 Unspecified atrial fibrillation: Secondary | ICD-10-CM | POA: Diagnosis not present

## 2022-10-13 DIAGNOSIS — R0602 Shortness of breath: Secondary | ICD-10-CM | POA: Diagnosis not present

## 2022-10-13 DIAGNOSIS — E785 Hyperlipidemia, unspecified: Secondary | ICD-10-CM | POA: Diagnosis not present

## 2022-10-13 DIAGNOSIS — E46 Unspecified protein-calorie malnutrition: Secondary | ICD-10-CM | POA: Diagnosis not present

## 2022-10-13 DIAGNOSIS — N3946 Mixed incontinence: Secondary | ICD-10-CM | POA: Diagnosis not present

## 2022-10-13 DIAGNOSIS — E569 Vitamin deficiency, unspecified: Secondary | ICD-10-CM | POA: Diagnosis not present

## 2022-10-18 DIAGNOSIS — E785 Hyperlipidemia, unspecified: Secondary | ICD-10-CM | POA: Diagnosis not present

## 2022-10-18 DIAGNOSIS — E569 Vitamin deficiency, unspecified: Secondary | ICD-10-CM | POA: Diagnosis not present

## 2022-10-18 DIAGNOSIS — E46 Unspecified protein-calorie malnutrition: Secondary | ICD-10-CM | POA: Diagnosis not present

## 2022-10-18 DIAGNOSIS — R0602 Shortness of breath: Secondary | ICD-10-CM | POA: Diagnosis not present

## 2022-10-18 DIAGNOSIS — N3946 Mixed incontinence: Secondary | ICD-10-CM | POA: Diagnosis not present

## 2022-10-18 DIAGNOSIS — I4891 Unspecified atrial fibrillation: Secondary | ICD-10-CM | POA: Diagnosis not present

## 2022-10-20 DIAGNOSIS — I4891 Unspecified atrial fibrillation: Secondary | ICD-10-CM | POA: Diagnosis not present

## 2022-10-20 DIAGNOSIS — E785 Hyperlipidemia, unspecified: Secondary | ICD-10-CM | POA: Diagnosis not present

## 2022-10-20 DIAGNOSIS — E46 Unspecified protein-calorie malnutrition: Secondary | ICD-10-CM | POA: Diagnosis not present

## 2022-10-20 DIAGNOSIS — R0602 Shortness of breath: Secondary | ICD-10-CM | POA: Diagnosis not present

## 2022-10-20 DIAGNOSIS — E569 Vitamin deficiency, unspecified: Secondary | ICD-10-CM | POA: Diagnosis not present

## 2022-10-20 DIAGNOSIS — N3946 Mixed incontinence: Secondary | ICD-10-CM | POA: Diagnosis not present

## 2022-10-25 DIAGNOSIS — E46 Unspecified protein-calorie malnutrition: Secondary | ICD-10-CM | POA: Diagnosis not present

## 2022-10-25 DIAGNOSIS — E785 Hyperlipidemia, unspecified: Secondary | ICD-10-CM | POA: Diagnosis not present

## 2022-10-25 DIAGNOSIS — E569 Vitamin deficiency, unspecified: Secondary | ICD-10-CM | POA: Diagnosis not present

## 2022-10-25 DIAGNOSIS — R0602 Shortness of breath: Secondary | ICD-10-CM | POA: Diagnosis not present

## 2022-10-25 DIAGNOSIS — I4891 Unspecified atrial fibrillation: Secondary | ICD-10-CM | POA: Diagnosis not present

## 2022-10-25 DIAGNOSIS — N3946 Mixed incontinence: Secondary | ICD-10-CM | POA: Diagnosis not present

## 2022-10-26 DIAGNOSIS — E569 Vitamin deficiency, unspecified: Secondary | ICD-10-CM | POA: Diagnosis not present

## 2022-10-26 DIAGNOSIS — N3946 Mixed incontinence: Secondary | ICD-10-CM | POA: Diagnosis not present

## 2022-10-26 DIAGNOSIS — E785 Hyperlipidemia, unspecified: Secondary | ICD-10-CM | POA: Diagnosis not present

## 2022-10-26 DIAGNOSIS — I4891 Unspecified atrial fibrillation: Secondary | ICD-10-CM | POA: Diagnosis not present

## 2022-10-26 DIAGNOSIS — R0602 Shortness of breath: Secondary | ICD-10-CM | POA: Diagnosis not present

## 2022-10-26 DIAGNOSIS — E46 Unspecified protein-calorie malnutrition: Secondary | ICD-10-CM | POA: Diagnosis not present

## 2022-10-27 DIAGNOSIS — E569 Vitamin deficiency, unspecified: Secondary | ICD-10-CM | POA: Diagnosis not present

## 2022-10-27 DIAGNOSIS — I4891 Unspecified atrial fibrillation: Secondary | ICD-10-CM | POA: Diagnosis not present

## 2022-10-27 DIAGNOSIS — R0602 Shortness of breath: Secondary | ICD-10-CM | POA: Diagnosis not present

## 2022-10-27 DIAGNOSIS — E46 Unspecified protein-calorie malnutrition: Secondary | ICD-10-CM | POA: Diagnosis not present

## 2022-10-27 DIAGNOSIS — E785 Hyperlipidemia, unspecified: Secondary | ICD-10-CM | POA: Diagnosis not present

## 2022-10-27 DIAGNOSIS — N3946 Mixed incontinence: Secondary | ICD-10-CM | POA: Diagnosis not present

## 2022-10-31 DIAGNOSIS — I509 Heart failure, unspecified: Secondary | ICD-10-CM | POA: Diagnosis not present

## 2022-10-31 DIAGNOSIS — I10 Essential (primary) hypertension: Secondary | ICD-10-CM | POA: Diagnosis not present

## 2022-10-31 DIAGNOSIS — E46 Unspecified protein-calorie malnutrition: Secondary | ICD-10-CM | POA: Diagnosis not present

## 2022-10-31 DIAGNOSIS — I4891 Unspecified atrial fibrillation: Secondary | ICD-10-CM | POA: Diagnosis not present

## 2022-10-31 DIAGNOSIS — K5900 Constipation, unspecified: Secondary | ICD-10-CM | POA: Diagnosis not present

## 2022-10-31 DIAGNOSIS — G311 Senile degeneration of brain, not elsewhere classified: Secondary | ICD-10-CM | POA: Diagnosis not present

## 2022-10-31 DIAGNOSIS — E785 Hyperlipidemia, unspecified: Secondary | ICD-10-CM | POA: Diagnosis not present

## 2022-10-31 DIAGNOSIS — E569 Vitamin deficiency, unspecified: Secondary | ICD-10-CM | POA: Diagnosis not present

## 2022-10-31 DIAGNOSIS — F411 Generalized anxiety disorder: Secondary | ICD-10-CM | POA: Diagnosis not present

## 2022-10-31 DIAGNOSIS — R0602 Shortness of breath: Secondary | ICD-10-CM | POA: Diagnosis not present

## 2022-10-31 DIAGNOSIS — R531 Weakness: Secondary | ICD-10-CM | POA: Diagnosis not present

## 2022-10-31 DIAGNOSIS — N3946 Mixed incontinence: Secondary | ICD-10-CM | POA: Diagnosis not present

## 2022-11-03 DIAGNOSIS — E569 Vitamin deficiency, unspecified: Secondary | ICD-10-CM | POA: Diagnosis not present

## 2022-11-03 DIAGNOSIS — E785 Hyperlipidemia, unspecified: Secondary | ICD-10-CM | POA: Diagnosis not present

## 2022-11-03 DIAGNOSIS — R0602 Shortness of breath: Secondary | ICD-10-CM | POA: Diagnosis not present

## 2022-11-03 DIAGNOSIS — N3946 Mixed incontinence: Secondary | ICD-10-CM | POA: Diagnosis not present

## 2022-11-03 DIAGNOSIS — I4891 Unspecified atrial fibrillation: Secondary | ICD-10-CM | POA: Diagnosis not present

## 2022-11-03 DIAGNOSIS — E46 Unspecified protein-calorie malnutrition: Secondary | ICD-10-CM | POA: Diagnosis not present

## 2022-11-08 DIAGNOSIS — N3946 Mixed incontinence: Secondary | ICD-10-CM | POA: Diagnosis not present

## 2022-11-08 DIAGNOSIS — E785 Hyperlipidemia, unspecified: Secondary | ICD-10-CM | POA: Diagnosis not present

## 2022-11-08 DIAGNOSIS — R0602 Shortness of breath: Secondary | ICD-10-CM | POA: Diagnosis not present

## 2022-11-08 DIAGNOSIS — E569 Vitamin deficiency, unspecified: Secondary | ICD-10-CM | POA: Diagnosis not present

## 2022-11-08 DIAGNOSIS — E46 Unspecified protein-calorie malnutrition: Secondary | ICD-10-CM | POA: Diagnosis not present

## 2022-11-08 DIAGNOSIS — I4891 Unspecified atrial fibrillation: Secondary | ICD-10-CM | POA: Diagnosis not present

## 2022-11-10 DIAGNOSIS — E569 Vitamin deficiency, unspecified: Secondary | ICD-10-CM | POA: Diagnosis not present

## 2022-11-10 DIAGNOSIS — E785 Hyperlipidemia, unspecified: Secondary | ICD-10-CM | POA: Diagnosis not present

## 2022-11-10 DIAGNOSIS — N3946 Mixed incontinence: Secondary | ICD-10-CM | POA: Diagnosis not present

## 2022-11-10 DIAGNOSIS — E46 Unspecified protein-calorie malnutrition: Secondary | ICD-10-CM | POA: Diagnosis not present

## 2022-11-10 DIAGNOSIS — I4891 Unspecified atrial fibrillation: Secondary | ICD-10-CM | POA: Diagnosis not present

## 2022-11-10 DIAGNOSIS — R0602 Shortness of breath: Secondary | ICD-10-CM | POA: Diagnosis not present

## 2022-11-15 DIAGNOSIS — N3946 Mixed incontinence: Secondary | ICD-10-CM | POA: Diagnosis not present

## 2022-11-15 DIAGNOSIS — E785 Hyperlipidemia, unspecified: Secondary | ICD-10-CM | POA: Diagnosis not present

## 2022-11-15 DIAGNOSIS — E46 Unspecified protein-calorie malnutrition: Secondary | ICD-10-CM | POA: Diagnosis not present

## 2022-11-15 DIAGNOSIS — I4891 Unspecified atrial fibrillation: Secondary | ICD-10-CM | POA: Diagnosis not present

## 2022-11-15 DIAGNOSIS — R0602 Shortness of breath: Secondary | ICD-10-CM | POA: Diagnosis not present

## 2022-11-15 DIAGNOSIS — E569 Vitamin deficiency, unspecified: Secondary | ICD-10-CM | POA: Diagnosis not present

## 2022-11-17 DIAGNOSIS — I4891 Unspecified atrial fibrillation: Secondary | ICD-10-CM | POA: Diagnosis not present

## 2022-11-17 DIAGNOSIS — R0602 Shortness of breath: Secondary | ICD-10-CM | POA: Diagnosis not present

## 2022-11-17 DIAGNOSIS — E569 Vitamin deficiency, unspecified: Secondary | ICD-10-CM | POA: Diagnosis not present

## 2022-11-17 DIAGNOSIS — E785 Hyperlipidemia, unspecified: Secondary | ICD-10-CM | POA: Diagnosis not present

## 2022-11-17 DIAGNOSIS — N3946 Mixed incontinence: Secondary | ICD-10-CM | POA: Diagnosis not present

## 2022-11-17 DIAGNOSIS — E46 Unspecified protein-calorie malnutrition: Secondary | ICD-10-CM | POA: Diagnosis not present

## 2022-11-23 DIAGNOSIS — R0602 Shortness of breath: Secondary | ICD-10-CM | POA: Diagnosis not present

## 2022-11-23 DIAGNOSIS — I5032 Chronic diastolic (congestive) heart failure: Secondary | ICD-10-CM | POA: Diagnosis not present

## 2022-11-23 DIAGNOSIS — E46 Unspecified protein-calorie malnutrition: Secondary | ICD-10-CM | POA: Diagnosis not present

## 2022-11-23 DIAGNOSIS — I4891 Unspecified atrial fibrillation: Secondary | ICD-10-CM | POA: Diagnosis not present

## 2022-11-23 DIAGNOSIS — E569 Vitamin deficiency, unspecified: Secondary | ICD-10-CM | POA: Diagnosis not present

## 2022-11-23 DIAGNOSIS — N3946 Mixed incontinence: Secondary | ICD-10-CM | POA: Diagnosis not present

## 2022-11-23 DIAGNOSIS — E785 Hyperlipidemia, unspecified: Secondary | ICD-10-CM | POA: Diagnosis not present

## 2022-11-24 DIAGNOSIS — E569 Vitamin deficiency, unspecified: Secondary | ICD-10-CM | POA: Diagnosis not present

## 2022-11-24 DIAGNOSIS — E46 Unspecified protein-calorie malnutrition: Secondary | ICD-10-CM | POA: Diagnosis not present

## 2022-11-24 DIAGNOSIS — I4891 Unspecified atrial fibrillation: Secondary | ICD-10-CM | POA: Diagnosis not present

## 2022-11-24 DIAGNOSIS — N3946 Mixed incontinence: Secondary | ICD-10-CM | POA: Diagnosis not present

## 2022-11-24 DIAGNOSIS — E785 Hyperlipidemia, unspecified: Secondary | ICD-10-CM | POA: Diagnosis not present

## 2022-11-24 DIAGNOSIS — R0602 Shortness of breath: Secondary | ICD-10-CM | POA: Diagnosis not present

## 2022-11-29 DIAGNOSIS — E569 Vitamin deficiency, unspecified: Secondary | ICD-10-CM | POA: Diagnosis not present

## 2022-11-29 DIAGNOSIS — I739 Peripheral vascular disease, unspecified: Secondary | ICD-10-CM | POA: Diagnosis not present

## 2022-11-29 DIAGNOSIS — L602 Onychogryphosis: Secondary | ICD-10-CM | POA: Diagnosis not present

## 2022-11-29 DIAGNOSIS — E46 Unspecified protein-calorie malnutrition: Secondary | ICD-10-CM | POA: Diagnosis not present

## 2022-11-29 DIAGNOSIS — L84 Corns and callosities: Secondary | ICD-10-CM | POA: Diagnosis not present

## 2022-11-29 DIAGNOSIS — I4891 Unspecified atrial fibrillation: Secondary | ICD-10-CM | POA: Diagnosis not present

## 2022-11-29 DIAGNOSIS — N3946 Mixed incontinence: Secondary | ICD-10-CM | POA: Diagnosis not present

## 2022-11-29 DIAGNOSIS — R0602 Shortness of breath: Secondary | ICD-10-CM | POA: Diagnosis not present

## 2022-11-29 DIAGNOSIS — L603 Nail dystrophy: Secondary | ICD-10-CM | POA: Diagnosis not present

## 2022-11-29 DIAGNOSIS — E785 Hyperlipidemia, unspecified: Secondary | ICD-10-CM | POA: Diagnosis not present

## 2022-11-30 DIAGNOSIS — R0602 Shortness of breath: Secondary | ICD-10-CM | POA: Diagnosis not present

## 2022-11-30 DIAGNOSIS — G311 Senile degeneration of brain, not elsewhere classified: Secondary | ICD-10-CM | POA: Diagnosis not present

## 2022-11-30 DIAGNOSIS — I4891 Unspecified atrial fibrillation: Secondary | ICD-10-CM | POA: Diagnosis not present

## 2022-11-30 DIAGNOSIS — I509 Heart failure, unspecified: Secondary | ICD-10-CM | POA: Diagnosis not present

## 2022-11-30 DIAGNOSIS — E46 Unspecified protein-calorie malnutrition: Secondary | ICD-10-CM | POA: Diagnosis not present

## 2022-11-30 DIAGNOSIS — N3946 Mixed incontinence: Secondary | ICD-10-CM | POA: Diagnosis not present

## 2022-11-30 DIAGNOSIS — K5900 Constipation, unspecified: Secondary | ICD-10-CM | POA: Diagnosis not present

## 2022-11-30 DIAGNOSIS — R531 Weakness: Secondary | ICD-10-CM | POA: Diagnosis not present

## 2022-11-30 DIAGNOSIS — E569 Vitamin deficiency, unspecified: Secondary | ICD-10-CM | POA: Diagnosis not present

## 2022-11-30 DIAGNOSIS — I10 Essential (primary) hypertension: Secondary | ICD-10-CM | POA: Diagnosis not present

## 2022-11-30 DIAGNOSIS — F411 Generalized anxiety disorder: Secondary | ICD-10-CM | POA: Diagnosis not present

## 2022-11-30 DIAGNOSIS — E785 Hyperlipidemia, unspecified: Secondary | ICD-10-CM | POA: Diagnosis not present

## 2022-12-07 DIAGNOSIS — F419 Anxiety disorder, unspecified: Secondary | ICD-10-CM | POA: Diagnosis not present

## 2022-12-10 DIAGNOSIS — F0394 Unspecified dementia, unspecified severity, with anxiety: Secondary | ICD-10-CM | POA: Diagnosis not present

## 2023-01-06 DIAGNOSIS — F0394 Unspecified dementia, unspecified severity, with anxiety: Secondary | ICD-10-CM | POA: Diagnosis not present

## 2023-02-03 DIAGNOSIS — I5032 Chronic diastolic (congestive) heart failure: Secondary | ICD-10-CM | POA: Diagnosis not present

## 2023-02-03 DIAGNOSIS — F0394 Unspecified dementia, unspecified severity, with anxiety: Secondary | ICD-10-CM | POA: Diagnosis not present

## 2023-06-30 DEATH — deceased
